# Patient Record
Sex: Female | Born: 1960
Health system: Southern US, Community
[De-identification: ages and names within clinical notes are randomized; demographics above are authoritative.]

## PROBLEM LIST (undated history)

## (undated) DIAGNOSIS — K219 Gastro-esophageal reflux disease without esophagitis: Secondary | ICD-10-CM

## (undated) DIAGNOSIS — R1032 Left lower quadrant pain: Secondary | ICD-10-CM

## (undated) DIAGNOSIS — F419 Anxiety disorder, unspecified: Secondary | ICD-10-CM

## (undated) DIAGNOSIS — R55 Syncope and collapse: Secondary | ICD-10-CM

## (undated) DIAGNOSIS — D649 Anemia, unspecified: Secondary | ICD-10-CM

## (undated) DIAGNOSIS — I10 Essential (primary) hypertension: Secondary | ICD-10-CM

## (undated) DIAGNOSIS — S92902A Unspecified fracture of left foot, initial encounter for closed fracture: Secondary | ICD-10-CM

## (undated) DIAGNOSIS — F32A Depression, unspecified: Secondary | ICD-10-CM

## (undated) DIAGNOSIS — M1990 Unspecified osteoarthritis, unspecified site: Secondary | ICD-10-CM

## (undated) DIAGNOSIS — R079 Chest pain, unspecified: Secondary | ICD-10-CM

## (undated) DIAGNOSIS — G4700 Insomnia, unspecified: Secondary | ICD-10-CM

## (undated) DIAGNOSIS — S82891A Other fracture of right lower leg, initial encounter for closed fracture: Secondary | ICD-10-CM

## (undated) DIAGNOSIS — Z8659 Personal history of other mental and behavioral disorders: Secondary | ICD-10-CM

## (undated) DIAGNOSIS — Z87898 Personal history of other specified conditions: Secondary | ICD-10-CM

## (undated) DIAGNOSIS — F410 Panic disorder [episodic paroxysmal anxiety] without agoraphobia: Secondary | ICD-10-CM

## (undated) DIAGNOSIS — E669 Obesity, unspecified: Secondary | ICD-10-CM

## (undated) DIAGNOSIS — R140 Abdominal distension (gaseous): Secondary | ICD-10-CM

## (undated) DIAGNOSIS — H109 Unspecified conjunctivitis: Secondary | ICD-10-CM

## (undated) DIAGNOSIS — Z973 Presence of spectacles and contact lenses: Secondary | ICD-10-CM

## (undated) DIAGNOSIS — G43909 Migraine, unspecified, not intractable, without status migrainosus: Secondary | ICD-10-CM

## (undated) DIAGNOSIS — G2581 Restless legs syndrome: Secondary | ICD-10-CM

## (undated) DIAGNOSIS — R569 Unspecified convulsions: Secondary | ICD-10-CM

## (undated) HISTORY — PX: ENDOMETRIAL ABLATION: SHX621

## (undated) HISTORY — DX: Syncope and collapse: R55

## (undated) HISTORY — DX: Left lower quadrant pain: R10.32

## (undated) HISTORY — DX: Chest pain, unspecified: R07.9

## (undated) HISTORY — DX: Unspecified conjunctivitis: H10.9

## (undated) HISTORY — DX: Unspecified convulsions: R56.9

## (undated) HISTORY — DX: Restless legs syndrome: G25.81

## (undated) HISTORY — DX: Abdominal distension (gaseous): R14.0

## (undated) HISTORY — PX: HYSTEROSCOPY W/ ENDOMETRIAL ABLATION: SUR665

## (undated) HISTORY — DX: Unspecified osteoarthritis, unspecified site: M19.90

## (undated) HISTORY — PX: POLYPECTOMY: SHX149

## (undated) HISTORY — DX: Insomnia, unspecified: G47.00

## (undated) HISTORY — DX: Anemia, unspecified: D64.9

## (undated) HISTORY — PX: FRACTURE SURGERY: SHX138

## (undated) HISTORY — PX: TUBAL LIGATION: SHX77

## (undated) HISTORY — DX: Panic disorder (episodic paroxysmal anxiety): F41.0

## (undated) HISTORY — DX: Anxiety disorder, unspecified: F41.9

## (undated) HISTORY — DX: Essential (primary) hypertension: I10

## (undated) HISTORY — DX: Obesity, unspecified: E66.9

## (undated) HISTORY — PX: CARDIAC CATHETERIZATION: SHX172

---
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
## 1999-12-08 ENCOUNTER — Emergency Department (HOSPITAL_COMMUNITY): Admission: EM | Admit: 1999-12-08 | Discharge: 1999-12-08 | Payer: Self-pay | Admitting: *Deleted

## 2007-02-03 ENCOUNTER — Emergency Department (HOSPITAL_COMMUNITY): Admission: EM | Admit: 2007-02-03 | Discharge: 2007-02-03 | Payer: Self-pay | Admitting: Emergency Medicine

## 2007-11-08 ENCOUNTER — Emergency Department (HOSPITAL_COMMUNITY): Admission: EM | Admit: 2007-11-08 | Discharge: 2007-11-08 | Payer: Self-pay | Admitting: Family Medicine

## 2010-01-27 ENCOUNTER — Ambulatory Visit: Payer: Self-pay | Admitting: Obstetrics & Gynecology

## 2010-01-27 ENCOUNTER — Other Ambulatory Visit: Admission: RE | Admit: 2010-01-27 | Discharge: 2010-01-27 | Payer: Self-pay | Admitting: Obstetrics and Gynecology

## 2010-01-28 ENCOUNTER — Encounter: Payer: Self-pay | Admitting: Obstetrics & Gynecology

## 2010-01-28 LAB — CONVERTED CEMR LAB
MCV: 77.8 fL — ABNORMAL LOW (ref 78.0–100.0)
Platelets: 363 10*3/uL (ref 150–400)
RDW: 15.8 % — ABNORMAL HIGH (ref 11.5–15.5)
TSH: 1.493 microintl units/mL (ref 0.350–4.500)
WBC: 10.3 10*3/uL (ref 4.0–10.5)

## 2010-02-03 ENCOUNTER — Ambulatory Visit (HOSPITAL_COMMUNITY): Admission: RE | Admit: 2010-02-03 | Discharge: 2010-02-03 | Payer: Self-pay | Admitting: Family Medicine

## 2010-02-18 ENCOUNTER — Ambulatory Visit: Payer: Self-pay | Admitting: Obstetrics and Gynecology

## 2010-02-24 ENCOUNTER — Ambulatory Visit: Payer: Self-pay | Admitting: Family Medicine

## 2010-02-24 ENCOUNTER — Encounter: Payer: Self-pay | Admitting: Family Medicine

## 2010-02-24 DIAGNOSIS — F410 Panic disorder [episodic paroxysmal anxiety] without agoraphobia: Secondary | ICD-10-CM | POA: Insufficient documentation

## 2010-02-24 DIAGNOSIS — N949 Unspecified condition associated with female genital organs and menstrual cycle: Secondary | ICD-10-CM | POA: Insufficient documentation

## 2010-02-24 DIAGNOSIS — Z8659 Personal history of other mental and behavioral disorders: Secondary | ICD-10-CM | POA: Insufficient documentation

## 2010-02-24 DIAGNOSIS — M1710 Unilateral primary osteoarthritis, unspecified knee: Secondary | ICD-10-CM | POA: Insufficient documentation

## 2010-02-24 DIAGNOSIS — N925 Other specified irregular menstruation: Secondary | ICD-10-CM | POA: Insufficient documentation

## 2010-02-24 DIAGNOSIS — N938 Other specified abnormal uterine and vaginal bleeding: Secondary | ICD-10-CM | POA: Insufficient documentation

## 2010-03-09 ENCOUNTER — Ambulatory Visit (HOSPITAL_COMMUNITY): Admission: RE | Admit: 2010-03-09 | Discharge: 2010-03-09 | Payer: Self-pay | Admitting: Family Medicine

## 2010-03-23 ENCOUNTER — Ambulatory Visit: Payer: Self-pay | Admitting: Obstetrics and Gynecology

## 2010-03-23 ENCOUNTER — Ambulatory Visit (HOSPITAL_COMMUNITY): Admission: RE | Admit: 2010-03-23 | Discharge: 2010-03-23 | Payer: Self-pay | Admitting: Obstetrics and Gynecology

## 2010-07-08 ENCOUNTER — Emergency Department (HOSPITAL_COMMUNITY): Admission: EM | Admit: 2010-07-08 | Discharge: 2009-08-13 | Payer: Self-pay | Admitting: Emergency Medicine

## 2010-08-31 NOTE — Assessment & Plan Note (Signed)
Summary: New pt visit   Vital Signs:  Patient profile:   50 year old female Height:      65 inches Weight:      292 pounds BMI:     48.77 Temp:     98.2 degrees F oral Pulse rate:   92 / minute BP sitting:   146 / 84  (left arm) Cuff size:   large  Vitals Entered By: Tessie Fass CMA (February 24, 2010 2:35 PM) CC: NEW PT Is Patient Diabetic? No Pain Assessment Patient in pain? no        Primary Provider:  Demetria Pore  CC:  NEW PT.  History of Present Illness: Pt presenting for new pt evaluation. No complaints or health concerns other than wanting help with loosing weight. Currently has pain in her knees and gets tired easily, especially when walking up stairs, but attributes this to her weight. She has lost a large amount of weight in the past while going to a bariatric clinic but has gained it back. She states that she is an emotional eater and would like to lose 150lbs. She is reasonable with this goal and understands that it will be a slow process and does not indicated that she wants any "quick fixes" or surgery. She is very interested in meeting with a nutrionist.  She is currently being seen at [facility] GYN clinic for menometorrhagia. She will recieve NovaSure endometrial ablation treatment. Nml pap and endometrial biopsy in the past month. CBC showed anemia. TSH was WNL.   Current Problems (verified): 1)  Depression  (ICD-311) 2)  Anxiety  (ICD-300.00) 3)  Family History Diabetes 1st Degree Relative  (ICD-V18.0) 4)  Family History Depression  (ICD-V17.0) 5)  Obesity, Unspecified  (ICD-278.00) 6)  Menorrhagia, Perimenopausal  (ICD-626.8) 7)  Hx of Panic Attack  (ICD-300.01) 8)  Knee Pain, Bilateral  (ICD-719.46)  Current Medications (verified): 1)  None  Allergies (verified): No Known Drug Allergies  Past History:  Family History: Last updated: 02/24/2010 Father: MI s/p CABG?, committed suicide at age 20 Mother: Alive, 58 y/o. Obesity, HTN, HLD, DM, MI in  her late 61s. Siblings: 4 siblings, ages 74, 39, 42, 53. All healthy, no significant medical problems.  Social History: Last updated: 02/24/2010 Marital Status: Separated x5 years. Husband lives in [place], [place]. Alcoholic. Bad marriage. Moved back to [place] 5 years ago. Currently lives in house that she owns with her children. Children: 4 (31 F, 24 M, 18 M, 17 M). All healthy. Occupation: [employer], started April 2011, works 3rd shift. College education.  Former Smoker, quit 2000 Alcohol use-yes, occ Regular exercise-no  Risk Factors: Exercise: no (02/24/2010)  Risk Factors: Smoking Status: quit (02/24/2010)  Past Medical History: Anxiety Depression Obesity  Past Surgical History: Tubal ligation- 55  Family History: Father: MI s/p CABG?, committed suicide at age 47 Mother: Alive, 41 y/o. Obesity, HTN, HLD, DM, MI in her late 28s. Siblings: 4 siblings, ages 77, 29, 74, 35. All healthy, no significant medical problems.  Social History: Marital Status: Separated x5 years. Husband lives in [place], [place]. Alcoholic. Bad marriage. Moved back to [place] 5 years ago. Currently lives in house that she owns with her children. Children: 4 (31 F, 24 M, 18 M, 17 M). All healthy. Occupation: [employer], started April 2011, works 3rd shift. College education.  Former Smoker, quit 2000 Alcohol use-yes, occ Regular exercise-no Smoking Status:  quit Does Patient Exercise:  no Sex Orientation:  Heterosexual Residence:  Renting Transportation:  Own Print production planner Use:  yes Alcohol:  Less than 3 drinks per week Education:  College Ethnicity:  White Drug Use:  never  Physical Exam  General:  Well-developed,well-nourished,in no acute distress; alert,appropriate and cooperative throughout examination Eyes:  No corneal or conjunctival inflammation noted. EOMI. Perrla. Vision grossly normal. Ears:  External ear exam shows no significant lesions or deformities.  Otoscopic  examination reveals clear canals, tympanic membranes are intact bilaterally without bulging, retraction, inflammation or discharge. Hearing is grossly normal bilaterally. Mouth:  Oral mucosa and oropharynx without lesions or exudates.  Teeth in good repair. Neck:  No deformities, masses, or tenderness noted. Lungs:  Normal respiratory effort, chest expands symmetrically. Lungs are clear to auscultation, no crackles or wheezes. Heart:  Normal rate and regular rhythm. S1 and S2 normal without gallop, murmur, click, rub or other extra sounds. Abdomen:  Bowel sounds positive,abdomen soft and non-tender, obese, without masses, organomegaly or hernias noted. Pulses:  R and L carotid,radial,femoral,dorsalis pedis and posterior tibial pulses are full and equal bilaterally Extremities:  No clubbing, cyanosis, edema, or deformity noted with normal full range of motion of all joints.   Neurologic:  alert & oriented X3.     Impression & Recommendations:  Problem # 1:  HEALTH MAINTENANCE EXAM (ICD-V70.0) Assessment New New pt visit to establish care. Will have pt schedule mammogram and colonoscopy. Have pt f/u in 3 months for BP recheck since ?HTN.  Problem # 2:  OBESITY, UNSPECIFIED (ICD-278.00) Assessment: New Will get fasting lipids. Pt to schedule appt with nutritionist for counseling. Future Orders: [facility] ([id number]) ... 02/25/2011  Problem # 3:  MENORRHAGIA, PERIMENOPAUSAL (ICD-626.8) Assessment: New Being followed by GYN clinic at [facility]. Will be called by their clinic with appt for NovaSure endometrial ablation. Endometrial bx and pap smear results were normal.   Patient Instructions: 1)   It was great meeting you today! 2)  Please schedule an appointment with the nutritionist in September. 3)  Until you can see the nutritionist, things you can do to help you start losing weight include eating twice as many vegetables as starches. Eating lean protein such as chicken and fish. Eating  at least 3 meals per day with 1-2 healthy snacks. Drinking water in place of soda or juice. Please do the 3 day calorie count at some point before your meeting with the nutritionist. 4)  Please call and let me know once you are scheduled for you NovaSure. 5)  I would like for you to come back to have some fasting labs drawn to check your cholesterol. 6)  I am giving you the phone number for the [facility]. Please call and make an appointment to have a mammogram done. 7)  I am also making a referral for you to have a colonoscopy done. This is just a routine screen that we do for everyone over the age of 43. Please call and make an appointment for this. 8)  I would like to see you back in 3 months to recheck your blood pressure and to see how the weight loss is going.

## 2010-09-22 ENCOUNTER — Emergency Department (HOSPITAL_COMMUNITY)
Admission: EM | Admit: 2010-09-22 | Discharge: 2010-09-22 | Disposition: A | Discharge status: Home / Self Care | Payer: No Typology Code available for payment source | Attending: Emergency Medicine | Admitting: Emergency Medicine

## 2010-09-22 ENCOUNTER — Emergency Department (HOSPITAL_COMMUNITY): Payer: Self-pay

## 2010-09-22 ENCOUNTER — Telehealth: Payer: Self-pay | Admitting: Family Medicine

## 2010-09-22 DIAGNOSIS — R209 Unspecified disturbances of skin sensation: Secondary | ICD-10-CM | POA: Insufficient documentation

## 2010-09-22 DIAGNOSIS — R569 Unspecified convulsions: Secondary | ICD-10-CM | POA: Insufficient documentation

## 2010-09-22 DIAGNOSIS — J329 Chronic sinusitis, unspecified: Secondary | ICD-10-CM | POA: Insufficient documentation

## 2010-09-22 DIAGNOSIS — R9431 Abnormal electrocardiogram [ECG] [EKG]: Secondary | ICD-10-CM | POA: Insufficient documentation

## 2010-09-22 DIAGNOSIS — R55 Syncope and collapse: Secondary | ICD-10-CM | POA: Insufficient documentation

## 2010-09-22 LAB — CBC
HCT: 34.4 % — ABNORMAL LOW (ref 36.0–46.0)
MCH: 22.9 pg — ABNORMAL LOW (ref 26.0–34.0)
MCHC: 30.8 g/dL (ref 30.0–36.0)
MCV: 74.3 fL — ABNORMAL LOW (ref 78.0–100.0)
Platelets: 353 10*3/uL (ref 150–400)
RDW: 19 % — ABNORMAL HIGH (ref 11.5–15.5)
WBC: 7.9 10*3/uL (ref 4.0–10.5)

## 2010-09-22 LAB — COMPREHENSIVE METABOLIC PANEL
ALT: 26 U/L (ref 0–35)
BUN: 12 mg/dL (ref 6–23)
CO2: 26 mEq/L (ref 19–32)
Calcium: 9.2 mg/dL (ref 8.4–10.5)
Creatinine, Ser: 0.59 mg/dL (ref 0.4–1.2)
GFR calc non Af Amer: >60 mL/min (ref >60)
Glucose, Bld: 92 mg/dL (ref 70–99)
Total Protein: 6.7 g/dL (ref 6.0–8.3)

## 2010-09-22 LAB — URINE MICROSCOPIC-ADD ON

## 2010-09-22 LAB — DIFFERENTIAL
Basophils Relative: 0 % (ref 0–1)
Eosinophils Absolute: 0.2 10*3/uL (ref 0.0–0.7)
Eosinophils Relative: 3 % (ref 0–5)
Lymphs Abs: 2.5 10*3/uL (ref 0.7–4.0)
Monocytes Absolute: 0.5 10*3/uL (ref 0.1–1.0)
Neutro Abs: 4.7 10*3/uL (ref 1.7–7.7)
Neutrophils Relative %: 60 % (ref 43–77)

## 2010-09-22 LAB — URINALYSIS, ROUTINE W REFLEX MICROSCOPIC
Bilirubin Urine: NEGATIVE
Ketones, ur: NEGATIVE mg/dL
Protein, ur: NEGATIVE mg/dL
Urine Glucose, Fasting: NEGATIVE mg/dL
Urobilinogen, UA: 0.2 mg/dL (ref 0.0–1.0)

## 2010-09-22 LAB — ETHANOL: Alcohol, Ethyl (B): <5 mg/dL (ref 0–10)

## 2010-09-22 LAB — RAPID URINE DRUG SCREEN, HOSP PERFORMED
Amphetamines: NOT DETECTED
Barbiturates: NOT DETECTED
Benzodiazepines: NOT DETECTED
Cocaine: NOT DETECTED
Opiates: NOT DETECTED
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
## 2010-09-24 ENCOUNTER — Encounter: Payer: Self-pay | Admitting: Family Medicine

## 2010-09-24 ENCOUNTER — Ambulatory Visit (INDEPENDENT_AMBULATORY_CARE_PROVIDER_SITE_OTHER): Payer: Self-pay | Admitting: Family Medicine

## 2010-09-24 VITALS — BP 180/79 | HR 89 | Wt 297.5 lb

## 2010-09-24 DIAGNOSIS — R569 Unspecified convulsions: Secondary | ICD-10-CM

## 2010-09-24 DIAGNOSIS — I10 Essential (primary) hypertension: Secondary | ICD-10-CM | POA: Insufficient documentation

## 2010-09-24 DIAGNOSIS — E669 Obesity, unspecified: Secondary | ICD-10-CM

## 2010-09-24 MED ORDER — HYDROCHLOROTHIAZIDE 25 MG PO TABS: 25.0000 mg | ORAL_TABLET | Freq: Every day | ORAL | Status: DC

## 2010-09-24 NOTE — Patient Instructions (Signed)
It was nice to meet you today!  Follow up in 2-4 weeks.

## 2010-09-27 ENCOUNTER — Encounter: Payer: Self-pay | Admitting: Family Medicine

## 2010-09-27 DIAGNOSIS — R55 Syncope and collapse: Secondary | ICD-10-CM | POA: Insufficient documentation

## 2010-09-27 HISTORY — DX: Syncope and collapse: R55

## 2010-09-27 NOTE — Assessment & Plan Note (Signed)
Hx of syncope, with witness seizure-like activity lasting 20 minutes, and possible postictal state on 09/22/10. Evaluated in ED - reviewed note. Normal exam, CT head, CE, CMP, UDS, ETOH, d-dimer, UA. CBC with Hgb 10.6 (stable). EKG with prolonged QT. No prolactin done at that time. Patient does not have insurance, so no f/u with neurology. Because episode was possibly a seizure, guidelines state that patient should not drive for 3 months or until seen by neurology.

## 2010-09-27 NOTE — Assessment & Plan Note (Signed)
Patient concerned about weight gain. She has never met with nutritionist and is interested in doing so. Discussed exercise. Patient to make appointment with nutritionist. She will make am appointment for f/u so that we can obtain FLP at that time as well.

## 2010-09-27 NOTE — Assessment & Plan Note (Signed)
Patient endorses Hx of HTN. Will start low-dose HCTZ, f/u in 2 weeks.

## 2010-09-27 NOTE — Progress Notes (Signed)
  Subjective:    Patient ID: Sheryl Cooper, female    DOB: 07-21-1961, 50 y.o.   MRN: [id number]  HPI  1. Follow-Up ED Visit: Visit reviewed. Episode of syncope with possible 20 minute seizure-like activity, followed by post-ictal state. This information is all based on Hx provided by patient's son. Patient transferred to ER via EMS - negative work-up. Patient denies Hx of seizures. No further episodes. She denies HA, dizziness, URI s/s, N/V/D, CP, SOB, increased LE edema.   2. HTN: Patient denies ever needing medication for this issue, but endorses prior high readings.   3. Obesity: Patient continues to gain weight. She states that she feels unable to manage this issue on her own. She does not exercise regularly. She has never met with a nutritionist before.  Review of Systems See HPI.    Objective:   Physical Exam  Constitutional: Vital signs are normal. She appears well-developed and well-nourished. No distress.  Eyes: EOM are normal. Pupils are equal, round, and reactive to light.  Cardiovascular: Normal rate, regular rhythm, S1 normal, S2 normal and normal pulses.   Murmur heard.  Systolic murmur is present with a grade of 2/6       1-2+ LE edema bilaterally.  Pulmonary/Chest: Effort normal and breath sounds normal.  Neurological: She is alert.          Assessment & Plan:

## 2010-09-28 ENCOUNTER — Encounter: Payer: Self-pay | Admitting: Family Medicine

## 2010-09-28 ENCOUNTER — Telehealth: Payer: Self-pay | Admitting: Family Medicine

## 2010-09-28 NOTE — Telephone Encounter (Signed)
Attempted to call patient re: driving restrictions. No answer. Letter written.

## 2010-10-12 ENCOUNTER — Ambulatory Visit (INDEPENDENT_AMBULATORY_CARE_PROVIDER_SITE_OTHER): Payer: Self-pay | Admitting: Family Medicine

## 2010-10-12 ENCOUNTER — Encounter: Payer: Self-pay | Admitting: Family Medicine

## 2010-10-12 VITALS — BP 147/78 | HR 73 | Temp 98.5°F | Ht 65.0 in | Wt 292.0 lb

## 2010-10-12 DIAGNOSIS — N949 Unspecified condition associated with female genital organs and menstrual cycle: Secondary | ICD-10-CM

## 2010-10-12 DIAGNOSIS — I10 Essential (primary) hypertension: Secondary | ICD-10-CM

## 2010-10-12 DIAGNOSIS — R569 Unspecified convulsions: Secondary | ICD-10-CM

## 2010-10-12 DIAGNOSIS — E669 Obesity, unspecified: Secondary | ICD-10-CM

## 2010-10-12 DIAGNOSIS — N938 Other specified abnormal uterine and vaginal bleeding: Secondary | ICD-10-CM

## 2010-10-12 LAB — COMPREHENSIVE METABOLIC PANEL
ALT: 26 U/L (ref 0–35)
AST: 32 U/L (ref 0–37)
CO2: 26 mEq/L (ref 19–32)
Calcium: 9.4 mg/dL (ref 8.4–10.5)
Chloride: 101 mEq/L (ref 96–112)
Creat: 0.59 mg/dL (ref 0.40–1.20)
Sodium: 139 mEq/L (ref 135–145)
Total Protein: 7.3 g/dL (ref 6.0–8.3)

## 2010-10-12 LAB — CONVERTED CEMR LAB
ALT: 26 units/L (ref 0–35)
AST: 32 units/L (ref 0–37)
Albumin: 4 g/dL (ref 3.5–5.2)
BUN: 12 mg/dL (ref 6–23)
CO2: 26 meq/L (ref 19–32)
Calcium: 9.4 mg/dL (ref 8.4–10.5)
Chloride: 101 meq/L (ref 96–112)
HDL: 45 mg/dL (ref >39)
Hemoglobin: 11.1 g/dL — ABNORMAL LOW (ref 12.0–15.0)
Potassium: 4.6 meq/L (ref 3.5–5.3)
RDW: 19.6 % — ABNORMAL HIGH (ref 11.5–15.5)
TSH: 2.052 microintl units/mL (ref 0.350–4.500)
WBC: 7.2 10*3/uL (ref 4.0–10.5)

## 2010-10-12 LAB — CBC
HCT: 36.9 % (ref 36.0–46.0)
Platelets: 370 10*3/uL (ref 150–400)
RBC: 4.79 MIL/uL (ref 3.87–5.11)
RDW: 19.6 % — ABNORMAL HIGH (ref 11.5–15.5)
WBC: 7.2 10*3/uL (ref 4.0–10.5)

## 2010-10-12 LAB — LIPID PANEL
HDL: 45 mg/dL (ref >39)
LDL Cholesterol: 57 mg/dL (ref 0–99)
Total CHOL/HDL Ratio: 3 Ratio

## 2010-10-12 LAB — TSH: TSH: 2.052 u[IU]/mL (ref 0.350–4.500)

## 2010-10-12 MED ORDER — LISINOPRIL-HYDROCHLOROTHIAZIDE 20-25 MG PO TABS: 1.0000 | ORAL_TABLET | Freq: Every day | ORAL | Status: DC

## 2010-10-12 NOTE — Assessment & Plan Note (Signed)
Adding Lisinopril. Patient to come back for nurse visit to check BP in a few weeks. CMP today.

## 2010-10-12 NOTE — Progress Notes (Signed)
  Subjective:    Patient ID: Sheryl Cooper, female    DOB: [birth year], 50 y.o.   MRN: [id number]  HPI  1. HTN: Rx HCTZ x 2-3 weeks. No side-effects. Endorses decreased LE edema. SBP usually 140s-150s. She has started the DASH diet. She is now exercising regularly. Denies HA, dizziness, CP, SOB, N/V/D, rash.  2. S/P Seizure: x 1. See last OV for more details. No more episodes. Patient received notification - no driving x 3 months. She is okay with this. She declines neurology consult at this time.   Review of Systems See HPI.    Objective:   Physical Exam  Constitutional: Vital signs are normal. She appears well-developed and well-nourished. No distress.  Neck: No thyromegaly present.  Cardiovascular: Normal rate, regular rhythm and normal heart sounds.   Pulmonary/Chest: Effort normal and breath sounds normal.  Musculoskeletal:       Right lower leg: She exhibits edema.       Left lower leg: She exhibits edema.       Edema improved bilateral LE.  Neurological: She is alert. She has normal strength. A cranial nerve deficit and sensory deficit is present. She displays a negative Romberg sign.          Assessment & Plan:

## 2010-10-12 NOTE — Assessment & Plan Note (Signed)
Hx of anemia. Will recheck with other labs today.

## 2010-10-12 NOTE — Assessment & Plan Note (Signed)
Checking CMP, FLP, TSH today.

## 2010-10-12 NOTE — Patient Instructions (Signed)
It was nice to see you today!  Continue with the DASH diet and exercise.  I am adding Lisinopril for your BP. Come back for a nurse visit in 4 weeks. You may check your BP at home as well.  I am getting labs today.  Remember, no driving for 3 months.  Restart your multivitamins, including Iron 325 extended release once daily.

## 2010-10-12 NOTE — Assessment & Plan Note (Signed)
No more episodes. Patient declines Neurology consult.

## 2010-10-13 ENCOUNTER — Encounter: Payer: Self-pay | Admitting: Family Medicine

## 2010-10-13 ENCOUNTER — Ambulatory Visit: Payer: Self-pay | Admitting: Family Medicine

## 2010-10-15 LAB — CBC
Hemoglobin: 9.3 g/dL — ABNORMAL LOW (ref 12.0–15.0)
Platelets: 281 10*3/uL (ref 150–400)
RBC: 4.22 MIL/uL (ref 3.87–5.11)
WBC: 7.1 10*3/uL (ref 4.0–10.5)

## 2010-10-17 LAB — POCT I-STAT, CHEM 8
BUN: 8 mg/dL (ref 6–23)
Calcium, Ion: 1.03 mmol/L — ABNORMAL LOW (ref 1.12–1.32)
Chloride: 106 mEq/L (ref 96–112)
Creatinine, Ser: 0.5 mg/dL (ref 0.4–1.2)
Glucose, Bld: 131 mg/dL — ABNORMAL HIGH (ref 70–99)

## 2010-10-17 LAB — POCT CARDIAC MARKERS: Troponin i, poc: <0.05 ng/mL (ref 0.00–0.09)

## 2010-10-17 LAB — CBC
HCT: 34.5 % — ABNORMAL LOW (ref 36.0–46.0)
Hemoglobin: 11 g/dL — ABNORMAL LOW (ref 12.0–15.0)
MCV: 76 fL — ABNORMAL LOW (ref 78.0–100.0)
RBC: 4.54 MIL/uL (ref 3.87–5.11)
WBC: 7.8 10*3/uL (ref 4.0–10.5)

## 2010-10-17 LAB — DIFFERENTIAL
Eosinophils Relative: 2 % (ref 0–5)
Lymphocytes Relative: 29 % (ref 12–46)
Lymphs Abs: 2.3 10*3/uL (ref 0.7–4.0)
Monocytes Absolute: 0.6 10*3/uL (ref 0.1–1.0)
Monocytes Relative: 8 % (ref 3–12)

## 2010-10-17 LAB — POCT PREGNANCY, URINE: Preg Test, Ur: NEGATIVE

## 2010-10-17 LAB — D-DIMER, QUANTITATIVE: D-Dimer, Quant: 0.47 ug/mL-FEU (ref 0.00–0.48)

## 2011-11-07 ENCOUNTER — Other Ambulatory Visit: Payer: Self-pay | Admitting: Family Medicine

## 2012-02-16 ENCOUNTER — Ambulatory Visit (INDEPENDENT_AMBULATORY_CARE_PROVIDER_SITE_OTHER): Payer: Self-pay | Admitting: Family Medicine

## 2012-02-16 ENCOUNTER — Encounter: Payer: Self-pay | Admitting: Family Medicine

## 2012-02-16 VITALS — BP 122/76 | HR 73 | Ht 65.0 in | Wt 268.8 lb

## 2012-02-16 DIAGNOSIS — R140 Abdominal distension (gaseous): Secondary | ICD-10-CM | POA: Insufficient documentation

## 2012-02-16 DIAGNOSIS — R141 Gas pain: Secondary | ICD-10-CM

## 2012-02-16 DIAGNOSIS — R143 Flatulence: Secondary | ICD-10-CM

## 2012-02-16 LAB — COMPREHENSIVE METABOLIC PANEL
ALT: 20 U/L (ref 0–35)
CO2: 25 mEq/L (ref 19–32)
Calcium: 10 mg/dL (ref 8.4–10.5)
Chloride: 101 mEq/L (ref 96–112)
Sodium: 136 mEq/L (ref 135–145)
Total Protein: 7.1 g/dL (ref 6.0–8.3)

## 2012-02-16 LAB — CBC
Platelets: 371 10*3/uL (ref 150–400)
RDW: 16 % — ABNORMAL HIGH (ref 11.5–15.5)
WBC: 6.5 10*3/uL (ref 4.0–10.5)

## 2012-02-16 NOTE — Assessment & Plan Note (Addendum)
Will check CMET, CBC, lipase, although all will likely be normal.  Pt would prefer NOT to have abd U/S done due to financial constraints. Red flags for return discussed. F/u if not improving.

## 2012-02-16 NOTE — Patient Instructions (Addendum)
It was good to see you today.  I'm sorry you're having this problem.  We are going to get some blood work to make sure this isn't anything bad.  Most likely, all of these tests will be normal. If it continues, we should get an ultrasound of your stomach.  Pay attention to your diet and see if there is any particular food that seems to make this bloating feeling worse.  I think that your shoulder/back pain is related to your stomach fullness.  Come back if this starts getting worse, you feel like your stomach is actually getting swollen, you have fevers/chills, vomiting, or blood in your stool.

## 2012-02-16 NOTE — Progress Notes (Signed)
S: Pt comes in today for SDA for abdominal pain.  Epigastric bloating/fullness, feels like she needs to burp, which she has been doing, but only helps the pain/discomfort a little.  Has been happening x3 days, has been about the same, although yesterday was a little worse.  Not really a pain, more feels like she needs to belch.  Has been taking a lot of Tums the past few days- helping a little, also tried pepcid which might have helped.  Eating doesn't change the full feeling.   Also having some back pain between her shoulder blades- has tried stretching, Aleve, motrin and doesn't help the pain at all.  Back pain is more burning in nature, not sharp, also for 3 days.  Stomach fullness gets worse, it does not change her back pain.  + mild nausea, 1-2 episodes of lightheadedness at the same time. No vomiting or diarrhea, no melena or blood in stool, last BM yesterday.  No fevers/chills.  No relation to exercise, no CP or different/unusual SOB.  No changes in activity (no bending, lifting, moving heavy objects) but did recently start walking for exercise this week.    On Atkin's diet for about 6 weeks, so not eating carbs or sodas.  Eats lots of fruits and veggies, no recent changes in diet in the past 6 weeks.  Under a lot of stress right now (family members have lost their jobs and she can't afford to pay for everything for everyone).   Feels a little better today after stopping iced tea drinks, which she first started ~1 week ago.    ROS: Per HPI  History  Smoking status  . Former Smoker  Smokeless tobacco  . Former Neurosurgeon  . Quit date: 09/24/1993    O:  Filed Vitals:   02/16/12 0917  BP: 122/76  Pulse: 73    Gen: NAD CV: RRR, no murmur Pulm: CTA bilat, no wheezes or crackles Abd: soft, NT Ext: Warm, no chronic skin changes, no edema   A/P: 51 y.o. female p/w abdominal bloating -See problem list -f/u in 1 week

## 2012-02-26 ENCOUNTER — Encounter (HOSPITAL_BASED_OUTPATIENT_CLINIC_OR_DEPARTMENT_OTHER): Payer: Self-pay | Admitting: Nurse Practitioner

## 2012-02-26 ENCOUNTER — Emergency Department (HOSPITAL_BASED_OUTPATIENT_CLINIC_OR_DEPARTMENT_OTHER)
Admission: EM | Admit: 2012-02-26 | Discharge: 2012-02-26 | Disposition: A | Discharge status: Home / Self Care | Payer: Self-pay | Attending: Emergency Medicine | Admitting: Emergency Medicine

## 2012-02-26 DIAGNOSIS — K089 Disorder of teeth and supporting structures, unspecified: Secondary | ICD-10-CM | POA: Insufficient documentation

## 2012-02-26 DIAGNOSIS — K047 Periapical abscess without sinus: Secondary | ICD-10-CM

## 2012-02-26 DIAGNOSIS — K029 Dental caries, unspecified: Secondary | ICD-10-CM | POA: Insufficient documentation

## 2012-02-26 MED ORDER — PENICILLIN V POTASSIUM 500 MG PO TABS: 500.0000 mg | ORAL_TABLET | Freq: Four times a day (QID) | ORAL | Status: DC

## 2012-02-26 MED ORDER — TRAMADOL HCL 50 MG PO TABS: 50.0000 mg | ORAL_TABLET | Freq: Four times a day (QID) | ORAL | Status: DC | PRN

## 2012-02-26 NOTE — ED Notes (Signed)
Pt states taking daughters rx for  125mg  ketoprofen for pain from abscess  at 1500 and 2200 and c/o facial and lip swelling w/in 30 min of dose.

## 2012-02-26 NOTE — ED Provider Notes (Signed)
History   This chart was scribed for Eberardo Demello Smitty Cords, MD by Shari Heritage. The patient was seen in room MH07/MH07. Patient's care was started at 0045.     CSN: [id number]  Arrival date & time 02/26/12  0045   First MD Initiated Contact with Patient 02/26/12 [phone]      Chief Complaint  Patient presents with  . Allergic Reaction    (Consider location/radiation/quality/duration/timing/severity/associated sxs/prior treatment) Patient is a 51 y.o. female presenting with tooth pain. The history is provided by the patient. No language interpreter was used.  Dental PainPrimary symptoms do not include oral bleeding, fever, shortness of breath, sore throat, angioedema or cough. The symptoms began yesterday. The symptoms are worsening. The symptoms are new. The symptoms occur constantly.  Additional symptoms include: dental sensitivity to temperature and facial swelling. Additional symptoms do not include: purulent gums, trismus, trouble swallowing and drooling. Medical issues do not include: smoking.   BERT GIVANS is a 51 y.o. female who presents to the Emergency Department complaining of moderate to severe, constant right upper dental pain in teeth 1-4 with associated right-sided cheek and lip swelling. Patient states that she took her daughter's Ketoprofen 125mg  prescription at 3:00pm and 10:00pm today, thought facial swelling may have been related. Patient does not have a dentist. She has a medical h/o cancer. Patient is a former smoker (quit date: 09/25/1993).   Past Medical History  Diagnosis Date  . Cancer     History reviewed. No pertinent past surgical history.  No family history on file.  History  Substance Use Topics  . Smoking status: Former Games developer  . Smokeless tobacco: Former Neurosurgeon    Quit date: 09/24/1993  . Alcohol Use: Not on file    OB History    Grav Para Term Preterm Abortions TAB SAB Ect Mult Living                  Review of Systems  Constitutional:  Negative for fever.  HENT: Positive for facial swelling. Negative for sore throat, drooling and trouble swallowing.   Respiratory: Negative for cough and shortness of breath.   All other systems reviewed and are negative.   A complete 10 system review of systems was obtained and all systems are negative except as noted in the HPI and PMH.   Allergies  Review of patient's allergies indicates no known allergies.  Home Medications   Current Outpatient Rx  Name Route Sig Dispense Refill  . LISINOPRIL-HYDROCHLOROTHIAZIDE 20-25 MG PO TABS  TAKE 1 TABLET BY MOUTH DAILY. 30 tablet 0    Needs appointment prior to next refill    BP 143/85  Pulse 76  Temp 97.5 F (36.4 C) (Oral)  Resp 21  SpO2 100%  LMP 02/12/2012  Physical Exam  Nursing note and vitals reviewed. Constitutional: She is oriented to person, place, and time. She appears well-developed and well-nourished.  HENT:  Mouth/Throat: Oropharynx is clear and moist. Dental caries present. No oropharyngeal exudate.       Mild cheek and lip swelling on the right side. Teeth 1 and 2 have cavities. Teeth 3 and 4 are necrotic and infected and broken below the gum line  Eyes: Conjunctivae and EOM are normal. Pupils are equal, round, and reactive to light.  Neck: Normal range of motion. Neck supple. No tracheal deviation present. No thyromegaly present.  Cardiovascular: Normal rate and regular rhythm.   Pulmonary/Chest: Effort normal and breath sounds normal. No stridor.  Abdominal: Soft. Bowel sounds are  normal.  Musculoskeletal: Normal range of motion.  Neurological: She is alert and oriented to person, place, and time.  Skin: Skin is warm and dry.  Psychiatric: She has a normal mood and affect.    ED Course  Procedures (including critical care time) DIAGNOSTIC STUDIES: Oxygen Saturation is 100% on room air, normal by my interpretation.    COORDINATION OF CARE: 12:58am- Patient informed of current plan for treatment and  evaluation and agrees with plan at this time.     Labs Reviewed - No data to display No results found.   No diagnosis found.    MDM  Return for worsening swelling, inability to swallow and inability to open mouth, fevers or any concerns. Follow up with dentistry for treatment of dental caries.  Patient verbalizes understanding and agrees to follow up      I personally performed the services described in this documentation, which was scribed in my presence. The recorded information has been reviewed and considered.    Jasmine Awe, MD 02/26/12 [phone]

## 2012-02-26 NOTE — ED Notes (Signed)
Pt. swelling is unilateral on Right side and has dental pain on rt. Side. Pt. Is in no distress, and had no trouble talking and handling secretions.

## 2012-04-19 ENCOUNTER — Other Ambulatory Visit: Payer: Self-pay | Admitting: Family Medicine

## 2012-07-05 ENCOUNTER — Telehealth: Payer: Self-pay | Admitting: Family Medicine

## 2012-07-05 NOTE — Telephone Encounter (Signed)
Received call from patient stating that she has a tooth infection with some gum swelling.  Wondering if we could call in an antibiotic for her.  I advised without evaluating her that I could not prescribe and antibiotic.  I advised that she call for work in appointment tomorrow morning to be evaluated then make follow up appointment with me as I am her new PCP.

## 2012-07-06 ENCOUNTER — Ambulatory Visit (INDEPENDENT_AMBULATORY_CARE_PROVIDER_SITE_OTHER): Payer: Self-pay | Admitting: Family Medicine

## 2012-07-06 ENCOUNTER — Encounter: Payer: Self-pay | Admitting: Family Medicine

## 2012-07-06 VITALS — BP 156/81 | HR 66 | Temp 98.1°F | Ht 65.0 in | Wt 244.2 lb

## 2012-07-06 DIAGNOSIS — K047 Periapical abscess without sinus: Secondary | ICD-10-CM | POA: Insufficient documentation

## 2012-07-06 DIAGNOSIS — K044 Acute apical periodontitis of pulpal origin: Secondary | ICD-10-CM

## 2012-07-06 MED ORDER — TRAMADOL HCL 50 MG PO TABS: 50.0000 mg | ORAL_TABLET | Freq: Four times a day (QID) | ORAL | Status: AC | PRN

## 2012-07-06 MED ORDER — PENICILLIN V POTASSIUM 500 MG PO TABS: 500.0000 mg | ORAL_TABLET | Freq: Three times a day (TID) | ORAL | Status: AC

## 2012-07-06 NOTE — Assessment & Plan Note (Signed)
Needs to have tooth removed- will be able to have this done in January.  Given 1 month supply of PCN for obvious dental infection (?pulpitis).  Also given tramadol for pain control. F/u if worsening.

## 2012-07-06 NOTE — Progress Notes (Signed)
S: Pt comes in today for SDA for tooth infection.  Patient reports that she has had this before over the summer.  Pain started 3 days ago, swelling started yesterday.  Feels like it is a little worse today.  Feels like cheek is a little warm.  Is top back tooth that hurts.  Knows that she needs to go to dentist; starts having dental insurance in January.  Knows that she needs oral surgery due to 2 or 3 dead teeth.  No fevers/chills.  No drainage into mouth.  Pain is throbbing.  Able to drink and swallow ok.  Has not tried to eat solids because she thinks it will hurt.  Is taking Aleve, which helps a little with the pain if she takes more than she is supposed to.    Over the summer, had good pain control with tramadol; does not want percocet- doesn't like how it makes her feel.    ROS: Per HPI  History  Smoking status  . Former Smoker  Smokeless tobacco  . Former Neurosurgeon  . Quit date: 09/24/1993    O:  Filed Vitals:   07/06/12 1047  BP: 156/81  Pulse: 66  Temp: 98.1 F (36.7 C)    Gen: NAD HEENT: no TTP, small amount of swelling on R cheek without warmth, + upper gum swelling, partial top denture- infected tooth is just lateral on the right, no drainage from tooth but obvious irritation and inflammation, no cervical LAD, no trismus, able to open jaw fully     A/P: 50 y.o. female p/w tooth infection -See problem list -f/u in PRN with PCP

## 2012-07-06 NOTE — Patient Instructions (Signed)
It was nice to see you.  Please make an appointment with Dr. Ashley Royalty to have your blood pressure rechecked.  I am giving you some penicillin and pain medicine for your tooth.  I'm glad you will be able to get it out in January!  Please come back if you feel like the antibiotic is not helping (it is getting more swollen, red, painful or you start having drainage from around your tooth).

## 2012-08-21 ENCOUNTER — Other Ambulatory Visit: Payer: Self-pay | Admitting: Family Medicine

## 2012-08-21 DIAGNOSIS — Z1231 Encounter for screening mammogram for malignant neoplasm of breast: Secondary | ICD-10-CM

## 2012-08-30 ENCOUNTER — Ambulatory Visit (HOSPITAL_COMMUNITY): Payer: Self-pay

## 2012-09-28 ENCOUNTER — Other Ambulatory Visit: Payer: Self-pay | Admitting: Family Medicine

## 2012-11-12 ENCOUNTER — Encounter: Payer: 59 | Admitting: Family Medicine

## 2012-11-20 ENCOUNTER — Ambulatory Visit (INDEPENDENT_AMBULATORY_CARE_PROVIDER_SITE_OTHER): Payer: 59 | Admitting: Family Medicine

## 2012-11-20 ENCOUNTER — Encounter: Payer: Self-pay | Admitting: Family Medicine

## 2012-11-20 ENCOUNTER — Other Ambulatory Visit (HOSPITAL_COMMUNITY)
Admission: RE | Admit: 2012-11-20 | Discharge: 2012-11-20 | Disposition: A | Discharge status: Home / Self Care | Payer: 59 | Source: Ambulatory Visit | Attending: Family Medicine | Admitting: Family Medicine

## 2012-11-20 VITALS — BP 127/78 | HR 58 | Ht 65.0 in | Wt 237.0 lb

## 2012-11-20 DIAGNOSIS — Z01419 Encounter for gynecological examination (general) (routine) without abnormal findings: Secondary | ICD-10-CM | POA: Insufficient documentation

## 2012-11-20 DIAGNOSIS — Z124 Encounter for screening for malignant neoplasm of cervix: Secondary | ICD-10-CM

## 2012-11-20 DIAGNOSIS — Z0000 Encounter for general adult medical examination without abnormal findings: Secondary | ICD-10-CM

## 2012-11-20 DIAGNOSIS — I10 Essential (primary) hypertension: Secondary | ICD-10-CM

## 2012-11-20 DIAGNOSIS — E669 Obesity, unspecified: Secondary | ICD-10-CM

## 2012-11-20 LAB — CBC
Hemoglobin: 13.1 g/dL (ref 12.0–15.0)
MCH: 27.9 pg (ref 26.0–34.0)
MCHC: 32.9 g/dL (ref 30.0–36.0)
MCV: 84.9 fL (ref 78.0–100.0)
Platelets: 357 10*3/uL (ref 150–400)
RBC: 4.69 MIL/uL (ref 3.87–5.11)

## 2012-11-20 LAB — LIPID PANEL
Cholesterol: 151 mg/dL (ref 0–200)
LDL Cholesterol: 83 mg/dL (ref 0–99)
Triglycerides: 122 mg/dL (ref <150)

## 2012-11-20 LAB — COMPREHENSIVE METABOLIC PANEL
AST: 29 U/L (ref 0–37)
Albumin: 4.3 g/dL (ref 3.5–5.2)
Alkaline Phosphatase: 59 U/L (ref 39–117)
BUN: 14 mg/dL (ref 6–23)
CO2: 29 mEq/L (ref 19–32)
Chloride: 102 mEq/L (ref 96–112)
Creat: 0.61 mg/dL (ref 0.50–1.10)
Total Protein: 7.1 g/dL (ref 6.0–8.3)

## 2012-11-20 MED ORDER — SERTRALINE HCL 50 MG PO TABS: 50.0000 mg | ORAL_TABLET | Freq: Every day | ORAL | Status: DC

## 2012-11-20 NOTE — Progress Notes (Signed)
Patient ID: Sheryl Cooper, female   DOB: May 01, 1961, 52 y.o.   MRN: [id number] SUBJECTIVE:  52 y.o. female for annual routine Pap and checkup. Perimenopausal, no period since December Reports problem of increased anxiety and difficulty sleeping.  Has had this problem in the past, unsure of prior treatment.  Anxiety stemming from her children moving back home  And dealing with them.  This has led to insomnia which has made her anxiety worse.  Does have some symptoms of panic including feeling of palpitations and shortness of breath that typically subsides pretty quickly.  She is interested in a medication to help with this.    Current Outpatient Prescriptions  Medication Sig Dispense Refill  . lisinopril-hydrochlorothiazide (PRINZIDE,ZESTORETIC) 20-25 MG per tablet TAKE 1 TABLET BY MOUTH DAILY.  30 tablet  PRN   No current facility-administered medications for this visit.   Allergies: Review of patient's allergies indicates no known allergies.  Patient's last menstrual period was 08/01/2012.  ROS:  Feeling well. No dyspnea or chest pain on exertion.  No abdominal pain, change in bowel habits, black or bloody stools.  No urinary tract symptoms. GYN ROS: normal menses, no abnormal bleeding, pelvic pain or discharge, no breast pain or new or enlarging lumps on self exam. No neurological complaints.  OBJECTIVE:  The patient appears well, alert, oriented x 3, in no distress. BP 127/78  Pulse 58  Ht 5\' 5"  (1.651 m)  Wt 237 lb (107.502 kg)  BMI 39.44 kg/m2  LMP 08/01/2012 ENT normal.  Neck supple. No adenopathy or thyromegaly. PERLA. Lungs are clear, good air entry, no wheezes, rhonchi or rales. S1 and S2 normal, no murmurs, regular rate and rhythm. Abdomen soft without tenderness, guarding, mass or organomegaly. Extremities show no edema, normal peripheral pulses. Neurological is normal, no focal findings.  PELVIC EXAM: normal external genitalia, vulva, vagina, cervix, uterus and adnexa, PAP:  Pap smear done today, exam chaperoned by Danella Sensing  ASSESSMENT:  well woman Anxiety with features of panic  PLAN:  mammogram pap smear counseled on mammography screening and menopause additional lab tests per orders Instructed to schedule screening colonoscopy  Discussed sleep hygiene and trial of melatonin or valerian root for sleep as she is looking for OTC natural  remedy.  Will give a trial of sertraline for anxiety, follow up in 2-4 weeks.

## 2012-11-20 NOTE — Patient Instructions (Addendum)
Thank you for coming in today, it was nice to meet you I would suggest trying melatonin or valerian root for sleep if you are looking for an over the counter remedy If your symptoms continue follow up with me Schedule your colonoscopy and mammogram

## 2012-11-21 ENCOUNTER — Other Ambulatory Visit: Payer: Self-pay

## 2012-11-21 DIAGNOSIS — Z1231 Encounter for screening mammogram for malignant neoplasm of breast: Secondary | ICD-10-CM

## 2012-11-27 ENCOUNTER — Ambulatory Visit: Payer: 59

## 2013-01-22 ENCOUNTER — Encounter: Payer: Self-pay | Admitting: Family Medicine

## 2013-01-24 MED ORDER — SERTRALINE HCL 100 MG PO TABS: 100.0000 mg | ORAL_TABLET | Freq: Every day | ORAL | Status: DC

## 2013-01-24 MED ORDER — TRAMADOL HCL 50 MG PO TABS: 50.0000 mg | ORAL_TABLET | Freq: Three times a day (TID) | ORAL | Status: DC | PRN

## 2013-01-29 ENCOUNTER — Other Ambulatory Visit: Payer: Self-pay | Admitting: Family Medicine

## 2013-01-29 ENCOUNTER — Telehealth: Payer: Self-pay | Admitting: Family Medicine

## 2013-01-29 DIAGNOSIS — Z1231 Encounter for screening mammogram for malignant neoplasm of breast: Secondary | ICD-10-CM

## 2013-01-29 NOTE — Telephone Encounter (Signed)
Sheryl Cooper,  [name], is in the front office, replace [name], is your patient, and she used to see Dr. Ashley Royalty, he increased her Zoloft to 100mg  and she is asking for you to decrease to 75mg  because she is having side effects like nausea and diarrhea.  She spoke to [employer], RN, the triage nurse, and she suggested that she speak to you about decreasing the dose.  [name]

## 2013-01-29 NOTE — Telephone Encounter (Signed)
I'm in clinic on Thursday, so I'll speak to her then about it. Thanks! --CMS

## 2013-02-12 ENCOUNTER — Ambulatory Visit (HOSPITAL_COMMUNITY)
Admission: RE | Admit: 2013-02-12 | Discharge: 2013-02-12 | Disposition: A | Discharge status: Home / Self Care | Payer: 59 | Source: Ambulatory Visit | Attending: Family Medicine | Admitting: Family Medicine

## 2013-02-12 DIAGNOSIS — Z1231 Encounter for screening mammogram for malignant neoplasm of breast: Secondary | ICD-10-CM | POA: Insufficient documentation

## 2013-04-08 ENCOUNTER — Encounter: Payer: Self-pay | Admitting: Family Medicine

## 2013-04-08 ENCOUNTER — Ambulatory Visit (INDEPENDENT_AMBULATORY_CARE_PROVIDER_SITE_OTHER): Payer: 59 | Admitting: Family Medicine

## 2013-04-08 VITALS — BP 115/74 | HR 79 | Temp 98.5°F | Wt 222.0 lb

## 2013-04-08 DIAGNOSIS — H109 Unspecified conjunctivitis: Secondary | ICD-10-CM

## 2013-04-08 NOTE — Assessment & Plan Note (Signed)
Viral conjunctivitis of the right eye -will treat conservatively with warm compresses and artificial tears -patient told to notify physician if she develops thick white discharge as this could represent a bacterial infection

## 2013-04-08 NOTE — Progress Notes (Signed)
  Subjective:    Patient ID: Sheryl Cooper, female    DOB: 1960-12-14, 52 y.o.   MRN: [id number]  HPI 52 y/o female presents for acute visit. She has had a itchy/watery right eye for the past 24 hours, she is concerned that she may have "pink eye", her eye is red on the right side, no associated rhinorrhea or congestion, no fevers or chills, no sick contacts, no eye pain   Review of Systems  Constitutional: Negative for fever and chills.  Eyes: Positive for discharge, redness and itching. Negative for pain.  Respiratory: Negative for cough, choking and shortness of breath.   Cardiovascular: Negative for chest pain.  Gastrointestinal: Negative for nausea, vomiting and diarrhea.       Objective:   Physical Exam  Vitals: reviewed HEENT: normocephalic, PERRL, EOMI, scleral erythema present of the right eye, watery discharge present in the right eye, no pus, MMM, neck was supple Cardiac: RRR, S1 and S2 present, no murmurs Resp: CTAB, normal effort      Assessment & Plan:  Please see problem specific assessment and plan.

## 2013-04-08 NOTE — Patient Instructions (Signed)
Conjunctivitis Conjunctivitis is commonly called "pink eye." Conjunctivitis can be caused by bacterial or viral infection, allergies, or injuries. There is usually redness of the lining of the eye, itching, discomfort, and sometimes discharge. There may be deposits of matter along the eyelids. A viral infection usually causes a watery discharge, while a bacterial infection causes a yellowish, thick discharge. Pink eye is very contagious and spreads by direct contact. You may be given antibiotic eyedrops as part of your treatment. Before using your eye medicine, remove all drainage from the eye by washing gently with warm water and cotton balls. Continue to use the medication until you have awakened 2 mornings in a row without discharge from the eye. Do not rub your eye. This increases the irritation and helps spread infection. Use separate towels from other household members. Wash your hands with soap and water before and after touching your eyes. Use cold compresses to reduce pain and sunglasses to relieve irritation from light. Do not wear contact lenses or wear eye makeup until the infection is gone. SEEK MEDICAL CARE IF:   Your symptoms are not better after 3 days of treatment.  You have increased pain or trouble seeing.  The outer eyelids become very red or swollen. Document Released: 08/25/2004 Document Revised: 10/10/2011 Document Reviewed: 07/18/2005 [facility] Patient Information 2014 [place], [place].  May use over the counter eye drops/artificial tears up to 4 times daily to rinse the eye. May also use warm compresses to the affected eye. Wash your hands frequently.

## 2013-05-09 ENCOUNTER — Encounter: Payer: Self-pay | Admitting: Internal Medicine

## 2013-05-15 ENCOUNTER — Ambulatory Visit: Payer: 59

## 2013-05-15 ENCOUNTER — Ambulatory Visit (INDEPENDENT_AMBULATORY_CARE_PROVIDER_SITE_OTHER): Payer: 59 | Admitting: Family Medicine

## 2013-05-15 VITALS — BP 92/58 | HR 89 | Temp 98.8°F | Resp 18 | Ht 65.5 in | Wt 221.2 lb

## 2013-05-15 DIAGNOSIS — R059 Cough, unspecified: Secondary | ICD-10-CM

## 2013-05-15 DIAGNOSIS — R0981 Nasal congestion: Secondary | ICD-10-CM

## 2013-05-15 DIAGNOSIS — R05 Cough: Secondary | ICD-10-CM

## 2013-05-15 DIAGNOSIS — J029 Acute pharyngitis, unspecified: Secondary | ICD-10-CM

## 2013-05-15 DIAGNOSIS — J3489 Other specified disorders of nose and nasal sinuses: Secondary | ICD-10-CM

## 2013-05-15 LAB — POCT CBC
Granulocyte percent: 62.8 %G (ref 37–80)
HCT, POC: 34.1 % — AB (ref 37.7–47.9)
Hemoglobin: 10.5 g/dL — AB (ref 12.2–16.2)
Lymph, poc: 1.7 (ref 0.6–3.4)
MCH, POC: 27.6 pg (ref 27–31.2)
MCHC: 30.8 g/dL — AB (ref 31.8–35.4)
MCV: 89.6 fL (ref 80–97)
MID (cbc): 0.5 (ref 0–0.9)
MPV: 8.8 fL (ref 0–99.8)
POC Granulocyte: 3.8 (ref 2–6.9)
POC LYMPH PERCENT: 29 %L (ref 10–50)
POC MID %: 8.2 %M (ref 0–12)
Platelet Count, POC: 127 10*3/uL — AB (ref 142–424)
RBC: 3.81 M/uL — AB (ref 4.04–5.48)
RDW, POC: 15 %
WBC: 6 10*3/uL (ref 4.6–10.2)

## 2013-05-15 MED ORDER — HYDROCOD POLST-CHLORPHEN POLST 10-8 MG/5ML PO LQCR: 5.0000 mL | Freq: Two times a day (BID) | ORAL | Status: DC | PRN

## 2013-05-15 MED ORDER — METHYLPREDNISOLONE (PAK) 4 MG PO TABS: ORAL_TABLET | ORAL | Status: DC

## 2013-05-15 MED ORDER — AZITHROMYCIN 250 MG PO TABS: ORAL_TABLET | ORAL | Status: DC

## 2013-05-15 NOTE — Progress Notes (Signed)
Reviewed documentation and xray and agree w/ assessment and plan. Lasundra Hascall, MD MPH   

## 2013-05-15 NOTE — Progress Notes (Signed)
  Subjective:    Patient ID: Sheryl Cooper, female    DOB: 18-Jun-1961, 52 y.o.   MRN: [id number]  Cough Associated symptoms include ear pain (bilateral fullness), postnasal drip, a sore throat and shortness of breath. Pertinent negatives include no chest pain, headaches or wheezing.  Sore Throat  Associated symptoms include congestion, coughing, ear pain (bilateral fullness) and shortness of breath. Pertinent negatives include no headaches or vomiting.   52 year old female presents with 5 day history of nasal congestion, PND, cough, chest congestion, bilateral ear fullness, and sore throat.  States symptoms seem to have progressively gotten worse and have not responded to OTC treatment. Admits to "feeling hot" but has not had any documented fever or chills. Has been taking Robitussin, Mucinex, and Alka Seltzer.  Has had several sick contacts.  Admits to SOB but no wheezing or chest pain.  Has had multiple "coughing attacks" that have interfered with her work.   She is a former smoker.  Hx of HTN controlled with lisinopril. No hx of DM.  Patient is otherwise doing well with no other concerns today.     Review of Systems  HENT: Positive for congestion, ear pain (bilateral fullness), postnasal drip and sore throat. Negative for sinus pressure.   Respiratory: Positive for cough and shortness of breath. Negative for chest tightness and wheezing.   Cardiovascular: Negative for chest pain.  Gastrointestinal: Negative for nausea and vomiting.  Neurological: Negative for dizziness and headaches.       Objective:   Physical Exam  Constitutional: She is oriented to person, place, and time. She appears well-developed and well-nourished.  HENT:  Head: Normocephalic and atraumatic.  Right Ear: Hearing, tympanic membrane, external ear and ear canal normal.  Left Ear: Hearing, tympanic membrane and ear canal normal.  Mouth/Throat: Uvula is midline and mucous membranes are normal. Posterior  oropharyngeal erythema (clear postnasal drainage) present. No oropharyngeal exudate, posterior oropharyngeal edema or tonsillar abscesses.  Eyes: Conjunctivae are normal.  Neck: Normal range of motion. Neck supple.  Cardiovascular: Normal rate, regular rhythm and normal heart sounds.   Pulmonary/Chest: Effort normal and breath sounds normal.  Lymphadenopathy:    She has no cervical adenopathy.  Neurological: She is alert and oriented to person, place, and time.  Psychiatric: She has a normal mood and affect. Her behavior is normal. Judgment and thought content normal.      [facility] reading (PRIMARY) by  Dr. Clelia Croft as no acute infiltrate or consolidation.       Assessment & Plan:   Cough - Plan: DG Chest 2 View, POCT CBC, azithromycin (ZITHROMAX) 250 MG tablet, methylPREDNIsolone (MEDROL DOSPACK) 4 MG tablet, chlorpheniramine-HYDROcodone (TUSSIONEX PENNKINETIC ER) 10-8 MG/5ML LQCR  Nasal congestion  Acute pharyngitis  Will cover with a Zpack. Start Medrol dose pack tomorrow Tussionex qhs prn cough.  Increase fluids and rest Continue Mucinex as directed Follow up if symptoms worsen or fail to improve.

## 2013-05-22 ENCOUNTER — Encounter: Payer: Self-pay | Admitting: Family Medicine

## 2013-05-27 ENCOUNTER — Other Ambulatory Visit: Payer: Self-pay | Admitting: Family Medicine

## 2013-05-28 ENCOUNTER — Ambulatory Visit (INDEPENDENT_AMBULATORY_CARE_PROVIDER_SITE_OTHER): Payer: 59 | Admitting: Family Medicine

## 2013-05-28 ENCOUNTER — Encounter: Payer: Self-pay | Admitting: Family Medicine

## 2013-05-28 VITALS — BP 133/93 | HR 71 | Temp 98.4°F | Wt 221.0 lb

## 2013-05-28 DIAGNOSIS — G2581 Restless legs syndrome: Secondary | ICD-10-CM

## 2013-05-28 DIAGNOSIS — G4700 Insomnia, unspecified: Secondary | ICD-10-CM

## 2013-05-28 MED ORDER — ROPINIROLE HCL 0.25 MG PO TABS: 0.2500 mg | ORAL_TABLET | Freq: Three times a day (TID) | ORAL | Status: DC

## 2013-05-28 MED ORDER — TRAMADOL HCL 50 MG PO TABS: 50.0000 mg | ORAL_TABLET | Freq: Three times a day (TID) | ORAL | Status: DC | PRN

## 2013-05-28 MED ORDER — SERTRALINE HCL 100 MG PO TABS: 150.0000 mg | ORAL_TABLET | Freq: Every day | ORAL | Status: DC

## 2013-05-28 NOTE — Assessment & Plan Note (Signed)
Patient presents for evaluation of insomnia that is no longer lead with over-the-counter sleep aids. -Proper sleep hygiene was discussed with the patient including not reading in bed prior to sleeping as well as exercising earlier in the day. -She has previously had relief of her symptoms with melatonin will reattempt trial, patient is hesitant to initiate a stronger sleep aid -Suspect that her restless leg syndrome may be contributing to her insomnia

## 2013-05-28 NOTE — Patient Instructions (Signed)
Insomnia Insomnia is frequent trouble falling and/or staying asleep. Insomnia can be a long term problem or a short term problem. Both are common. Insomnia can be a short term problem when the wakefulness is related to a certain stress or worry. Long term insomnia is often related to ongoing stress during waking hours and/or poor sleeping habits. Overtime, sleep deprivation itself can make the problem worse. Every little thing feels more severe because you are overtired and your ability to cope is decreased. CAUSES   Stress, anxiety, and depression.  Poor sleeping habits.  Distractions such as TV in the bedroom.  Naps close to bedtime.  Engaging in emotionally charged conversations before bed.  Technical reading before sleep.  Alcohol and other sedatives. They may make the problem worse. They can hurt normal sleep patterns and normal dream activity.  Stimulants such as caffeine for several hours prior to bedtime.  Pain syndromes and shortness of breath can cause insomnia.  Exercise late at night.  Changing time zones may cause sleeping problems (jet lag). It is sometimes helpful to have someone observe your sleeping patterns. They should look for periods of not breathing during the night (sleep apnea). They should also look to see how long those periods last. If you live alone or observers are uncertain, you can also be observed at a sleep clinic where your sleep patterns will be professionally monitored. Sleep apnea requires a checkup and treatment. Give your caregivers your medical history. Give your caregivers observations your family has made about your sleep.  SYMPTOMS   Not feeling rested in the morning.  Anxiety and restlessness at bedtime.  Difficulty falling and staying asleep. TREATMENT   Your caregiver may prescribe treatment for an underlying medical disorders. Your caregiver can give advice or help if you are using alcohol or other drugs for self-medication. Treatment  of underlying problems will usually eliminate insomnia problems.  Medications can be prescribed for short time use. They are generally not recommended for lengthy use.  Over-the-counter sleep medicines are not recommended for lengthy use. They can be habit forming.  You can promote easier sleeping by making lifestyle changes such as:  Using relaxation techniques that help with breathing and reduce muscle tension.  Exercising earlier in the day.  Changing your diet and the time of your last meal. No night time snacks.  Establish a regular time to go to bed.  Counseling can help with stressful problems and worry.  Soothing music and white noise may be helpful if there are background noises you cannot remove.  Stop tedious detailed work at least one hour before bedtime. HOME CARE INSTRUCTIONS   Keep a diary. Inform your caregiver about your progress. This includes any medication side effects. See your caregiver regularly. Take note of:  Times when you are asleep.  Times when you are awake during the night.  The quality of your sleep.  How you feel the next day. This information will help your caregiver care for you.  Get out of bed if you are still awake after 15 minutes. Read or do some quiet activity. Keep the lights down. Wait until you feel sleepy and go back to bed.  Keep regular sleeping and waking hours. Avoid naps.  Exercise regularly.  Avoid distractions at bedtime. Distractions include watching television or engaging in any intense or detailed activity like attempting to balance the household checkbook.  Develop a bedtime ritual. Keep a familiar routine of bathing, brushing your teeth, climbing into bed at the same   time each night, listening to soothing music. Routines increase the success of falling to sleep faster.  Use relaxation techniques. This can be using breathing and muscle tension release routines. It can also include visualizing peaceful scenes. You can  also help control troubling or intruding thoughts by keeping your mind occupied with boring or repetitive thoughts like the old concept of counting sheep. You can make it more creative like imagining planting one beautiful flower after another in your backyard garden.  During your day, work to eliminate stress. When this is not possible use some of the previous suggestions to help reduce the anxiety that accompanies stressful situations. MAKE SURE YOU:   Understand these instructions.  Will watch your condition.  Will get help right away if you are not doing well or get worse. Document Released: 07/15/2000 Document Revised: 10/10/2011 Document Reviewed: 08/15/2007 [facility] Patient Information 2014 [place], [place]. Restless Legs Syndrome Restless legs syndrome is a movement disorder. It may also be called a sensori-motor disorder.  CAUSES  No one knows what specifically causes restless legs syndrome, but it tends to run in families. It is also more common in people with low iron, in pregnancy, in people who need dialysis, and those with nerve damage (neuropathy).Some medications may make restless legs syndrome worse.Those medications include drugs to treat high blood pressure, some heart conditions, nausea, colds, allergies, and depression. SYMPTOMS Symptoms include uncomfortable sensations in the legs. These leg sensations are worse during periods of inactivity or rest. They are also worse while sitting or lying down. Individuals that have the disorder describe sensations in the legs that feel like:  Pulling.  Drawing.  Crawling.  Worming.  Boring.  Tingling.  Pins and needles.  Prickling.  Pain. The sensations are usually accompanied by an overwhelming urge to move the legs. Sudden muscle jerks may also occur. Movement provides temporary relief from the discomfort. In rare cases, the arms may also be affected. Symptoms may interfere with going to sleep (sleep onset insomnia).  Restless legs syndrome may also be related to periodic limb movement disorder (PLMD). PLMD is another more common motor disorder. It also causes interrupted sleep. The symptoms from PLMD usually occur most often when you are awake. TREATMENT  Treatment for restless legs syndrome is symptomatic. This means that the symptoms are treated.   Massage and cold compresses may provide temporary relief.  Walk, stretch, or take a cold or hot bath.  Get regular exercise and a good night's sleep.  Avoid caffeine, alcohol, nicotine, and medications that can make it worse.  Do activities that provide mental stimulation like discussions, needlework, and video games. These may be helpful if you are not able to walk or stretch. Some medications are effective in relieving the symptoms. However, many of these medications have side effects. Ask your caregiver about medications that may help your symptoms. Correcting iron deficiency may improve symptoms for some patients. Document Released: 07/08/2002 Document Revised: 10/10/2011 Document Reviewed: 10/14/2010 [facility] Patient Information 2014 [place], [place].

## 2013-05-28 NOTE — Progress Notes (Signed)
  Subjective:    Patient ID: Sheryl Cooper, female    DOB: 1960-12-12, 52 y.o.   MRN: [id number]  HPI 52 year old female presents for evaluation of insomnia. She has previously been evaluated for this at [facility]. She has previously had success with melatonin as well as over-the-counter sleeping aids. These have no longer been working. She states that she has difficulty with both sleep initiation as well as maintenance of sleep throughout the evening. Most evenings she gets only a few hours of sleep, she states that she sleeps in a cold room, she occasionally will Sharber for blood which does provide some benefit, she occasionally will exercise prior to bed thinking this would make her more drowsy, she does often read in bed prior to attempting sleep, she does report a history of right lower extremity irritation that is resolved with moving the lower extremity, this occurs mainly when she is attempting to fall asleep, no associated numbness or tingling in the lower extremity throughout the day   Review of Systems  Constitutional: Negative for fever, chills and fatigue.  Respiratory: Negative for apnea and chest tightness.   Cardiovascular: Negative for chest pain.  Neurological: Positive for headaches.       Objective:   Physical Exam Vitals: Reviewed General: Pleasant Caucasian female, no acute distress Cardiac: Regular in rhythm, S1 and S2 present, no murmurs, no heaves or thrills Respiratory: Clear to patient bilaterally, normal effort Extremities: No edema Skin: No rash       Assessment & Plan:  Please see problem specific assessment and plan.

## 2013-05-28 NOTE — Assessment & Plan Note (Signed)
Patient has symptoms consistent with restless leg syndrome of the right lower extremity. Most recent CBC showed normal hemoglobin therefore will not check ferritin at this time. -Will attempt trial of low-dose Ropinorole.

## 2013-06-06 ENCOUNTER — Other Ambulatory Visit: Payer: Self-pay

## 2013-06-17 ENCOUNTER — Other Ambulatory Visit: Payer: Self-pay | Admitting: Family Medicine

## 2013-07-01 HISTORY — PX: COLONOSCOPY: SHX174

## 2013-07-02 ENCOUNTER — Ambulatory Visit (AMBULATORY_SURGERY_CENTER): Payer: Self-pay | Admitting: *Deleted

## 2013-07-02 VITALS — Ht 65.0 in | Wt 230.0 lb

## 2013-07-02 DIAGNOSIS — Z1211 Encounter for screening for malignant neoplasm of colon: Secondary | ICD-10-CM

## 2013-07-02 MED ORDER — MOVIPREP 100 G PO SOLR: ORAL | Status: DC

## 2013-07-02 NOTE — Progress Notes (Signed)
No allergies to eggs or soy. No problems with anesthesia.  

## 2013-07-09 ENCOUNTER — Encounter: Payer: Self-pay | Admitting: Family Medicine

## 2013-07-10 ENCOUNTER — Other Ambulatory Visit: Payer: Self-pay | Admitting: Family Medicine

## 2013-07-10 MED ORDER — ROPINIROLE HCL 0.25 MG PO TABS: 0.5000 mg | ORAL_TABLET | Freq: Every day | ORAL | Status: DC

## 2013-07-10 NOTE — Telephone Encounter (Signed)
Patient has now started to have restless legs at night, would like to increase dose. Will attempt trial of 0.5 mg QHS.

## 2013-07-16 ENCOUNTER — Ambulatory Visit (AMBULATORY_SURGERY_CENTER): Payer: 59 | Admitting: Internal Medicine

## 2013-07-16 ENCOUNTER — Encounter: Payer: Self-pay | Admitting: Internal Medicine

## 2013-07-16 VITALS — BP 117/63 | HR 64 | Temp 97.2°F | Resp 25 | Ht 65.0 in | Wt 230.0 lb

## 2013-07-16 DIAGNOSIS — Z1211 Encounter for screening for malignant neoplasm of colon: Secondary | ICD-10-CM

## 2013-07-16 DIAGNOSIS — D126 Benign neoplasm of colon, unspecified: Secondary | ICD-10-CM

## 2013-07-16 MED ORDER — SODIUM CHLORIDE 0.9 % IV SOLN: 500.0000 mL | INTRAVENOUS | Status: DC

## 2013-07-16 NOTE — Progress Notes (Signed)
Patient did not have preoperative order for IV antibiotic SSI prophylaxis. (G8918)  Patient did not experience any of the following events: a burn prior to discharge; a fall within the facility; wrong site/side/patient/procedure/implant event; or a hospital transfer or hospital admission upon discharge from the facility. (G8907)  

## 2013-07-16 NOTE — Op Note (Signed)
[facility] [address] [place], [address]   COLONOSCOPY PROCEDURE REPORT  PATIENT: Sheryl Cooper, Sheryl Cooper  MR#: [id number] BIRTHDATE: 1961/07/29 , 52  yrs. old GENDER: Female ENDOSCOPIST: Roxy Cedar, MD REFERRED BY:.[facility]) PROCEDURE DATE:  07/16/2013 PROCEDURE:   Colonoscopy with snare polypectomy x 1 First Screening Colonoscopy - Avg.  risk and is 50 yrs.  old or older Yes.  Prior Negative Screening - Now for repeat screening. N/A  History of Adenoma - Now for follow-up colonoscopy & has been > or = to 3 yrs.  N/A  Polyps Removed Today? Yes. ASA CLASS:   Class II INDICATIONS:average risk screening. MEDICATIONS: MAC sedation, administered by CRNA and propofol (Diprivan) 450mg  IV  DESCRIPTION OF PROCEDURE:   After the risks benefits and alternatives of the procedure were thoroughly explained, informed consent was obtained.  A digital rectal exam revealed no abnormalities of the rectum.   The LB [id number]  endoscope was introduced through the anus and advanced to the cecum, which was identified by both the appendix and ileocecal valve. No adverse events experienced.   The quality of the prep was good, using MoviPrep  The instrument was then slowly withdrawn as the colon was fully examined.   COLON FINDINGS: A diminutive polyp was found at the ileocecal valve. A polypectomy was performed with a cold snare.  The resection was complete and the polyp tissue was completely retrieved.   Moderate diverticulosis was noted throughout the entire examined colon. The colon mucosa was otherwise normal.  Retroflexed views revealed internal hemorrhoids. The time to cecum=3 minutes 22 seconds. Withdrawal time=12 minutes 57 seconds.  The scope was withdrawn and the procedure completed. COMPLICATIONS: There were no complications.  ENDOSCOPIC IMPRESSION: 1.   Diminutive polyp was found at the ileocecal valve; polypectomy was performed with  a cold snare 2.   Moderate diverticulosis was noted throughout the entire examined colon 3.   The colon mucosa was otherwise normal  RECOMMENDATIONS: 1. Repeat colonoscopy in 5 years if polyp adenomatous; otherwise 10 years   eSigned:  Roxy Cedar, MD 07/16/2013 12:09 PM   cc: The Patient    ; Donnella Sham, MD [facility])

## 2013-07-16 NOTE — Progress Notes (Signed)
Called to room to assist during endoscopic procedure.  Patient ID and intended procedure confirmed with present staff. Received instructions for my participation in the procedure from the performing physician.  

## 2013-07-16 NOTE — Progress Notes (Signed)
Report to pacu rn, vss, bbs=clear 

## 2013-07-16 NOTE — Patient Instructions (Signed)
YOU HAD AN ENDOSCOPIC PROCEDURE TODAY AT THE [facility]: Refer to the procedure report that was given to you for any specific questions about what was found during the examination.  If the procedure report does not answer your questions, please call your gastroenterologist to clarify.  If you requested that your care partner not be given the details of your procedure findings, then the procedure report has been included in a sealed envelope for you to review at your convenience later.  YOU SHOULD EXPECT: Some feelings of bloating in the abdomen. Passage of more gas than usual.  Walking can help get rid of the air that was put into your GI tract during the procedure and reduce the bloating. If you had a lower endoscopy (such as a colonoscopy or flexible sigmoidoscopy) you may notice spotting of blood in your stool or on the toilet paper. If you underwent a bowel prep for your procedure, then you may not have a normal bowel movement for a few days.  DIET: Your first meal following the procedure should be a light meal and then it is ok to progress to your normal diet.  A half-sandwich or bowl of soup is an example of a good first meal.  Heavy or fried foods are harder to digest and may make you feel nauseous or bloated.  Likewise meals heavy in dairy and vegetables can cause extra gas to form and this can also increase the bloating.  Drink plenty of fluids but you should avoid alcoholic beverages for 24 hours.  ACTIVITY: Your care partner should take you home directly after the procedure.  You should plan to take it easy, moving slowly for the rest of the day.  You can resume normal activity the day after the procedure however you should NOT DRIVE or use heavy machinery for 24 hours (because of the sedation medicines used during the test).    SYMPTOMS TO REPORT IMMEDIATELY: A gastroenterologist can be reached at any hour.  During normal business hours, 8:30 AM to 5:00 PM Monday through Friday,  call [phone].  After hours and on weekends, please call the GI answering service at [phone] who will take a message and have the physician on call contact you.   Following lower endoscopy (colonoscopy or flexible sigmoidoscopy):  Excessive amounts of blood in the stool  Significant tenderness or worsening of abdominal pains  Swelling of the abdomen that is new, acute  Fever of 100F or higher  FOLLOW UP: If any biopsies were taken you will be contacted by phone or by letter within the next 1-3 weeks.  Call your gastroenterologist if you have not heard about the biopsies in 3 weeks.  Our staff will call the home number listed on your records the next business day following your procedure to check on you and address any questions or concerns that you may have at that time regarding the information given to you following your procedure. This is a courtesy call and so if there is no answer at the home number and we have not heard from you through the emergency physician on call, we will assume that you have returned to your regular daily activities without incident.  SIGNATURES/CONFIDENTIALITY: You and/or your care partner have signed paperwork which will be entered into your electronic medical record.  These signatures attest to the fact that that the information above on your After Visit Summary has been reviewed and is understood.  Full responsibility of the confidentiality of this   discharge information lies with you and/or your care-partner.  Please continue your normal medications  Await pathology  Please read over handouts about polyps, diverticulosis and high fiber diets 

## 2013-07-17 ENCOUNTER — Telehealth: Payer: Self-pay

## 2013-07-17 NOTE — Telephone Encounter (Signed)
  Follow up Call-  Call back number 07/16/2013  Post procedure Call Back phone  # [phone]  Permission to leave phone message Yes     Patient questions:  Do you have a fever, pain , or abdominal swelling? no Pain Score  0 *  Have you tolerated food without any problems? yes  Have you been able to return to your normal activities? yes  Do you have any questions about your discharge instructions: Diet   no Medications  no Follow up visit  no  Do you have questions or concerns about your Care? no  Actions: * If pain score is 4 or above: No action needed, pain <4.

## 2013-07-20 ENCOUNTER — Emergency Department (HOSPITAL_BASED_OUTPATIENT_CLINIC_OR_DEPARTMENT_OTHER): Payer: 59

## 2013-07-20 ENCOUNTER — Emergency Department (HOSPITAL_BASED_OUTPATIENT_CLINIC_OR_DEPARTMENT_OTHER)
Admission: EM | Admit: 2013-07-20 | Discharge: 2013-07-20 | Disposition: A | Discharge status: Home / Self Care | Payer: 59 | Attending: Emergency Medicine | Admitting: Emergency Medicine

## 2013-07-20 ENCOUNTER — Encounter (HOSPITAL_BASED_OUTPATIENT_CLINIC_OR_DEPARTMENT_OTHER): Payer: Self-pay | Admitting: Emergency Medicine

## 2013-07-20 DIAGNOSIS — Z79899 Other long term (current) drug therapy: Secondary | ICD-10-CM | POA: Insufficient documentation

## 2013-07-20 DIAGNOSIS — R0602 Shortness of breath: Secondary | ICD-10-CM | POA: Insufficient documentation

## 2013-07-20 DIAGNOSIS — F411 Generalized anxiety disorder: Secondary | ICD-10-CM | POA: Insufficient documentation

## 2013-07-20 DIAGNOSIS — Z8739 Personal history of other diseases of the musculoskeletal system and connective tissue: Secondary | ICD-10-CM | POA: Insufficient documentation

## 2013-07-20 DIAGNOSIS — R42 Dizziness and giddiness: Secondary | ICD-10-CM | POA: Insufficient documentation

## 2013-07-20 DIAGNOSIS — R110 Nausea: Secondary | ICD-10-CM | POA: Insufficient documentation

## 2013-07-20 DIAGNOSIS — Z87891 Personal history of nicotine dependence: Secondary | ICD-10-CM | POA: Insufficient documentation

## 2013-07-20 DIAGNOSIS — H9319 Tinnitus, unspecified ear: Secondary | ICD-10-CM | POA: Insufficient documentation

## 2013-07-20 DIAGNOSIS — E669 Obesity, unspecified: Secondary | ICD-10-CM | POA: Insufficient documentation

## 2013-07-20 DIAGNOSIS — R072 Precordial pain: Secondary | ICD-10-CM | POA: Insufficient documentation

## 2013-07-20 DIAGNOSIS — R51 Headache: Secondary | ICD-10-CM | POA: Insufficient documentation

## 2013-07-20 DIAGNOSIS — R079 Chest pain, unspecified: Secondary | ICD-10-CM

## 2013-07-20 DIAGNOSIS — R61 Generalized hyperhidrosis: Secondary | ICD-10-CM | POA: Insufficient documentation

## 2013-07-20 DIAGNOSIS — I10 Essential (primary) hypertension: Secondary | ICD-10-CM | POA: Insufficient documentation

## 2013-07-20 LAB — BASIC METABOLIC PANEL
Calcium: 9.1 mg/dL (ref 8.4–10.5)
Chloride: 100 mEq/L (ref 96–112)
Creatinine, Ser: 0.7 mg/dL (ref 0.50–1.10)
GFR calc Af Amer: >90 mL/min (ref >90)
GFR calc non Af Amer: >90 mL/min (ref >90)
Sodium: 138 mEq/L (ref 135–145)

## 2013-07-20 LAB — CBC
Hemoglobin: 11.8 g/dL — ABNORMAL LOW (ref 12.0–15.0)
MCH: 27.6 pg (ref 26.0–34.0)
MCHC: 32.2 g/dL (ref 30.0–36.0)
MCV: 85.5 fL (ref 78.0–100.0)
Platelets: 329 10*3/uL (ref 150–400)
RDW: 14.6 % (ref 11.5–15.5)
WBC: 8.3 10*3/uL (ref 4.0–10.5)

## 2013-07-20 LAB — TROPONIN I: Troponin I: <0.3 ng/mL (ref <0.30)

## 2013-07-20 LAB — D-DIMER, QUANTITATIVE (NOT AT ARMC): D-Dimer, Quant: <0.27 ug/mL-FEU (ref 0.00–0.48)

## 2013-07-20 MED ORDER — GI COCKTAIL ~~LOC~~
30.0000 mL | Freq: Once | ORAL | Status: AC
  Administered 2013-07-20: 30 mL via ORAL
  Filled 2013-07-20: qty 30

## 2013-07-20 MED ORDER — ASPIRIN 325 MG PO TABS
325.0000 mg | ORAL_TABLET | Freq: Once | ORAL | Status: AC
  Administered 2013-07-20: 325 mg via ORAL
  Filled 2013-07-20: qty 1

## 2013-07-20 NOTE — ED Notes (Signed)
Centralized chest pain x 3 min.  Pt sitting in chair, became diaphoretic, sob, dizzy.  Pt states pain is burning sensation in chest radiating down both arms.

## 2013-07-20 NOTE — ED Notes (Signed)
Pt d/c home with family. No new rx given

## 2013-07-20 NOTE — ED Provider Notes (Signed)
CSN: [id number]     Arrival date & time 07/20/13  1751 History  This chart was scribed for Dagmar Hait, MD by Clydene Laming, ED Scribe. This patient was seen in room MH03/MH03 and the patient's care was started at 6:25 PM.   Chief Complaint  Patient presents with  . Chest Pain    Patient is a 52 y.o. female presenting with chest pain. The history is provided by the patient. No language interpreter was used.  Chest Pain Pain location:  Substernal area Pain quality: burning   Pain quality: not sharp   Pain radiates to:  L arm and R arm Pain radiates to the back: no   Pain severity:  Mild Onset quality:  Sudden Duration:  10 minutes Timing:  Constant Progression:  Partially resolved Chronicity:  New Context: at rest   Context: not breathing   Relieved by:  Nothing Worsened by:  Nothing tried Ineffective treatments:  Certain positions Associated symptoms: diaphoresis, dizziness, headache, nausea and shortness of breath   Associated symptoms: no abdominal pain and not vomiting   Risk factors: hypertension    HPI Comments: Sheryl Cooper is a 52 y.o. female who presents to the Emergency Department complaining of centralized chest pain onset today. Pt was sitting in the chair holding her grandson around 5:30 PM. She sat her grandson down and pain began to worsen. Pt felt a burning sensation that radiated through the chest and to her arms. She became diaphoretic, nauseas, and short of breath. The episode last for about 10 mins. She also has a headache and ringing in the ears. Pt tried to change positions in the chair which did not improve symptoms. Pt did not consume anything out of the ordinary today. Pt has had reflux symptoms previously and states this is not what it feels like. Pt has a hx of hypertension, obesity, anxiety, and arthritis.   Past Medical History  Diagnosis Date  . Hypertension   . Anxiety   . Arthritis    Past Surgical History  Procedure Laterality  Date  . Tonsillectomy  1976  . Endometrial ablation    . Colonoscopy     Family History  Problem Relation Age of Onset  . Cancer Mother   . Diabetes Mother   . Heart disease Mother   . Hypertension Mother   . Colon cancer Neg Hx   . Esophageal cancer Neg Hx   . Stomach cancer Neg Hx   . Rectal cancer Neg Hx    History  Substance Use Topics  . Smoking status: Former Smoker    Quit date: 09/24/1993  . Smokeless tobacco: Never Used  . Alcohol Use: No   OB History   Grav Para Term Preterm Abortions TAB SAB Ect Mult Living                 Review of Systems  Constitutional: Positive for diaphoresis.  Respiratory: Positive for shortness of breath.   Cardiovascular: Positive for chest pain.  Gastrointestinal: Positive for nausea. Negative for vomiting, abdominal pain and diarrhea.  Neurological: Positive for dizziness and headaches.  All other systems reviewed and are negative.    Allergies  Review of patient's allergies indicates no known allergies.  Home Medications   Current Outpatient Rx  Name  Route  Sig  Dispense  Refill  . lisinopril-hydrochlorothiazide (PRINZIDE,ZESTORETIC) 20-25 MG per tablet      TAKE 1 TABLET BY MOUTH DAILY.   30 tablet   PRN   .  rOPINIRole (REQUIP) 0.25 MG tablet   Oral   Take 2 tablets (0.5 mg total) by mouth at bedtime. Take 1 tablet at bedtime as needed.   60 tablet   1   . sertraline (ZOLOFT) 100 MG tablet   Oral   Take 1.5 tablets (150 mg total) by mouth daily.   30 tablet   3   . traMADol (ULTRAM) 50 MG tablet   Oral   Take 1 tablet (50 mg total) by mouth every 8 (eight) hours as needed for pain.   30 tablet   1    Triage Vitals:BP 132/60  Pulse 75  Temp(Src) 98.6 F (37 C) (Oral)  Resp 16  Ht 5\' 5"  (1.651 m)  Wt 230 lb (104.327 kg)  BMI 38.27 kg/m2  SpO2 98% Physical Exam  Nursing note and vitals reviewed. Constitutional: She is oriented to person, place, and time. She appears well-developed and  well-nourished. No distress.  HENT:  Head: Normocephalic and atraumatic.  Eyes: EOM are normal.  Neck: Normal range of motion.  Cardiovascular: Normal rate, regular rhythm and normal heart sounds.   Pulmonary/Chest: Effort normal and breath sounds normal.  Abdominal: Soft. She exhibits no distension. There is no tenderness.  Musculoskeletal: Normal range of motion.  Neurological: She is alert and oriented to person, place, and time.  Skin: Skin is warm and dry.  Psychiatric: She has a normal mood and affect. Judgment normal.    ED Course  Procedures (including critical care time) DIAGNOSTIC STUDIES: Oxygen Saturation is 98% on RA, normal by my interpretation.    COORDINATION OF CARE: 6:30 PM- Discussed treatment plan with pt at bedside. Pt verbalized understanding and agreement with plan.   Labs Review Labs Reviewed - No data to display Imaging Review Dg Chest 2 View  07/20/2013   CLINICAL DATA:  Chest pain, shortness of breath, dizziness  EXAM: CHEST  2 VIEW  COMPARISON:  05/15/2013  FINDINGS: Chronic interstitial markings. No focal consolidation. No pleural effusion or pneumothorax.  The heart is normal in size.  Mild degenerative changes of the visualized thoracolumbar spine.  IMPRESSION: No evidence of acute cardiopulmonary disease.   Electronically Signed   By: Charline Bills M.D.   On: 07/20/2013 20:08    EKG Interpretation    Date/Time:  Saturday July 20 2013 17:56:41 EST Ventricular Rate:  77 PR Interval:  176 QRS Duration: 80 QT Interval:  402 QTC Calculation: 454 R Axis:   34 Text Interpretation:  Normal sinus rhythm Normal ECG No change from prior Confirmed by Gwendolyn Grant  MD, Mylan Lengyel ([id number]) on 07/20/2013 6:09:54 PM            MDM   1. Chest pain    I personally performed the services described in this documentation, which was scribed in my presence. The recorded information has been reviewed and is accurate.  51F here with atypical chest pain. 3  minutes of burning pain radiating to both arms. Associated diaphoresis and SOB. No N/V. Patient has no cardiac history.  EKG normal here. Exam benign. Labs normal. With atypical pain, serial troponin indicated. Serial troponin normal. Stable for discharge. Instructed to f/u with PCP for stress test.    Dagmar Hait, MD 07/21/13 [phone]

## 2013-07-23 ENCOUNTER — Encounter: Payer: Self-pay | Admitting: Family Medicine

## 2013-07-23 ENCOUNTER — Ambulatory Visit (INDEPENDENT_AMBULATORY_CARE_PROVIDER_SITE_OTHER): Payer: 59 | Admitting: Family Medicine

## 2013-07-23 VITALS — BP 130/79 | HR 70 | Ht 65.0 in | Wt 230.0 lb

## 2013-07-23 DIAGNOSIS — R079 Chest pain, unspecified: Secondary | ICD-10-CM

## 2013-07-23 MED ORDER — ASPIRIN 81 MG PO TABS: 81.0000 mg | ORAL_TABLET | Freq: Every day | ORAL | Status: DC

## 2013-07-23 NOTE — Patient Instructions (Signed)
A referral to cardiology has been made. You will be contacted when the referral has been processed.

## 2013-07-24 ENCOUNTER — Encounter: Payer: Self-pay | Admitting: Internal Medicine

## 2013-07-24 NOTE — Assessment & Plan Note (Signed)
Patient presents for hospital followup of chest discomfort. Workup in the emergency room was unremarkable. Patient is currently asymptomatic -Will refer to cardiology for possible stress test

## 2013-07-24 NOTE — Progress Notes (Signed)
   Subjective:    Patient ID: Sheryl Cooper, female    DOB: October 31, 1960, 52 y.o.   MRN: [id number]  HPI 52 year old Caucasian female presents for hospital emergency room followup. Patient was seen and evaluated on 07/20/2013 after she experienced a brief episode of chest pain at home. Patient states that she was sitting with her grandchild when she started to develop chest heaviness and pressure goes located in the middle of her chest, she did report some radiation of this chest discomfort to her bilateral arms, she did have associated shortness of breath and diaphoresis, she reported some ringing of her ears, she denies syncope or loss of consciousness, no jaw or back pain, no radiation of the pain to her back, she was seen and evaluated and [facility] emergency room where her workup was unremarkable, she states that the chest discomfort lasted for approximately 3-5 minutes and result spontaneously, the chest discomfort was not associated with activity  Patient reports that she does have a history of anxiety with mild panic attacks, she reports no anxiety prior to this episode, she has never had chest discomfort associated with panic attacks, she is currently taking Zoloft 150 mg daily and reports that her anxiety has been well-controlled for the past 6-9 months  She also reports a history of acid reflux however the symptoms were much more severe, she did not have associated burping or burning type sensation in her epigastrium  Patient is concerned that she may have cardiac disease as her mother experienced a heart attack around the same age as she is. She is interested in referral for a stress test.  Workup in the emergency room was unremarkable and patient was discharged home.  Social: Former smoker   Review of Systems  Constitutional: Negative for fever, chills and fatigue.  Respiratory: Negative for cough and shortness of breath.   Cardiovascular: Negative for chest pain, palpitations  and leg swelling.  Gastrointestinal: Negative for nausea, vomiting, diarrhea and constipation.       Objective:   Physical Exam Vitals: Reviewed General: Pleasant female, no acute distress HEENT: Normocephalic, pupils are equal round and reactive to light, extraocular movements are intact, no scleral icterus, was mucous membranes, no pharyngeal erythema or exudate, neck was supple Cardiac: Regular rate and rhythm, S1 and S2 present, no murmurs, no heaves or thrills, no carotid bruits Respiratory: Clear to auscultation bilaterally, normal effort Abdomen: Soft, nontender, bowel sounds present Extremities: No edema, 2+ radial pulses bilaterally  Reviewed lab work, chest x-ray, and EKG performed during emergency room visit.     Assessment & Plan:  Please see problem specific assessment and plan.

## 2013-08-21 ENCOUNTER — Other Ambulatory Visit: Payer: Self-pay | Admitting: Family Medicine

## 2013-08-21 ENCOUNTER — Encounter: Payer: Self-pay | Admitting: Family Medicine

## 2013-08-21 MED ORDER — ROPINIROLE HCL 0.25 MG PO TABS: 0.7500 mg | ORAL_TABLET | Freq: Every day | ORAL | Status: DC

## 2013-08-22 ENCOUNTER — Encounter: Payer: Self-pay | Admitting: Cardiovascular Disease

## 2013-08-22 ENCOUNTER — Encounter: Payer: Self-pay | Admitting: *Deleted

## 2013-08-23 ENCOUNTER — Ambulatory Visit: Payer: 59 | Admitting: Cardiovascular Disease

## 2013-08-28 ENCOUNTER — Encounter: Payer: Self-pay | Admitting: Family Medicine

## 2013-08-29 ENCOUNTER — Encounter: Payer: Self-pay | Admitting: Cardiovascular Disease

## 2013-09-30 ENCOUNTER — Other Ambulatory Visit: Payer: Self-pay | Admitting: Family Medicine

## 2013-09-30 MED ORDER — TRAMADOL HCL 50 MG PO TABS: 50.0000 mg | ORAL_TABLET | Freq: Three times a day (TID) | ORAL | Status: DC | PRN

## 2013-09-30 NOTE — Telephone Encounter (Signed)
Received fax from [facility] for Tramadol Prescription, refill provided vis "phone in"

## 2013-10-01 ENCOUNTER — Encounter: Payer: Self-pay | Admitting: Family Medicine

## 2013-10-16 ENCOUNTER — Encounter: Payer: Self-pay | Admitting: Family Medicine

## 2013-10-16 ENCOUNTER — Other Ambulatory Visit: Payer: Self-pay | Admitting: Family Medicine

## 2013-10-21 ENCOUNTER — Ambulatory Visit: Payer: 59 | Admitting: Family Medicine

## 2013-10-22 ENCOUNTER — Other Ambulatory Visit: Payer: Self-pay | Admitting: Family Medicine

## 2013-10-24 ENCOUNTER — Other Ambulatory Visit: Payer: Self-pay | Admitting: *Deleted

## 2013-10-25 ENCOUNTER — Ambulatory Visit (INDEPENDENT_AMBULATORY_CARE_PROVIDER_SITE_OTHER): Payer: 59 | Admitting: Family Medicine

## 2013-10-25 ENCOUNTER — Encounter: Payer: Self-pay | Admitting: Family Medicine

## 2013-10-25 VITALS — BP 105/77 | HR 81 | Temp 97.8°F

## 2013-10-25 DIAGNOSIS — E669 Obesity, unspecified: Secondary | ICD-10-CM

## 2013-10-25 DIAGNOSIS — F410 Panic disorder [episodic paroxysmal anxiety] without agoraphobia: Secondary | ICD-10-CM

## 2013-10-25 DIAGNOSIS — G2581 Restless legs syndrome: Secondary | ICD-10-CM

## 2013-10-25 DIAGNOSIS — I872 Venous insufficiency (chronic) (peripheral): Secondary | ICD-10-CM | POA: Insufficient documentation

## 2013-10-25 NOTE — Assessment & Plan Note (Signed)
Patient presents with leg swelling that is consistent with venous insufficiency. Previous lab work has identified normal kidney and liver function. No clinical signs or symptoms consistent with cardiac etiology at this time. Reviewed medication list and no culprits are identified. -Encourage elevation of legs during the day and compression stockings

## 2013-10-25 NOTE — Progress Notes (Signed)
   Subjective:    Patient ID: Sheryl Cooper, female    DOB: 29-Apr-1961, 53 y.o.   MRN: [id number]  HPI 53 y/o female presents for follow up of multiple medical issues.  Anxiety/Panic Attacks - worse over the past 1-2 months, currently on Zoloft 150 mg daily, she reports increased stress in her life, her youngest son attempted suicide last week and is not living with her, her daughter, granddaughter, and other son also live with her, last panic attack was last week, reports intermittent depression, no anhedonia, she enjoys to read, no homicidal or suicidal thoughts  Obesity - reports not exercising on a regular basis, plans to go to the gym at the hospital when it opens, she is no longer interested in Bariatric surgery, reports dietary indiscretions over the holiday season  Restless leg - takes Ropinirole 0.25 mg TID, symptoms mildly improved on Ropinirole, still has difficulty sleeping at times  Chronic leg swelling - does not wear compression stockings, no PND, no orthopnea   Review of Systems  Constitutional: Negative for fever, chills and fatigue.  HENT: Negative for congestion, postnasal drip and rhinorrhea.   Respiratory: Negative for cough, choking and chest tightness.   Cardiovascular: Positive for leg swelling. Negative for chest pain.  Gastrointestinal: Negative for nausea and diarrhea.       Objective:   Physical Exam Vitals: reviewed General: Pleasant female, no acute distress HEENT: Pupils are equal round and reactive to light, extraocular movements are intact, moist mucous members, neck was supple, no anterior or posterior cervical lymphadenopathy Cardiac: Regular in rhythm, S1 and S2 present, no murmurs, no heaves or thrills, no JVD Respiratory: Clear to patient bilaterally, normal effort Extremities: Trace bilateral pedal edema Psych: Appropriately dressed, laughing during exam, no SI, no HI, no tangential thought       Assessment & Plan:  Please see problem  specific assessment and plan.

## 2013-10-25 NOTE — Patient Instructions (Signed)
Restless Leg - take Ropinirole (3 tablets at night)  Anxiety - continue zoloft 150 daily, attempt to exercise 3 times a weeks for at least 30 minutes, Return to office in 4 weeks for follow up

## 2013-10-25 NOTE — Assessment & Plan Note (Signed)
Improved on Requip. -Patient counseled to take 3 tablets of 0.25 mg at nighttime

## 2013-10-25 NOTE — Assessment & Plan Note (Signed)
Weight stable from previous visit -Patient encouraged to exercise 3 times a week -Encouraged continuation of good eating habit

## 2013-10-25 NOTE — Assessment & Plan Note (Signed)
Patient has had worsening panic attacks over the past 1 to 2 months. This is likely due to multiple life stressors including her 3 children and grandchild living with her and the recent attempt by her son to commit suicide -Continue Zoloft -Patient not interested in referral to psychiatry at this time -Encourage time for herself and regular exercise

## 2013-11-05 ENCOUNTER — Ambulatory Visit (INDEPENDENT_AMBULATORY_CARE_PROVIDER_SITE_OTHER): Payer: 59 | Admitting: Family Medicine

## 2013-11-05 ENCOUNTER — Encounter: Payer: Self-pay | Admitting: Family Medicine

## 2013-11-05 ENCOUNTER — Ambulatory Visit (HOSPITAL_COMMUNITY)
Admission: RE | Admit: 2013-11-05 | Discharge: 2013-11-05 | Disposition: A | Discharge status: Home / Self Care | Payer: 59 | Source: Ambulatory Visit | Attending: Family Medicine | Admitting: Family Medicine

## 2013-11-05 VITALS — BP 126/78 | HR 81 | Ht 65.0 in

## 2013-11-05 DIAGNOSIS — M25569 Pain in unspecified knee: Secondary | ICD-10-CM

## 2013-11-05 DIAGNOSIS — M25561 Pain in right knee: Secondary | ICD-10-CM

## 2013-11-05 DIAGNOSIS — M179 Osteoarthritis of knee, unspecified: Secondary | ICD-10-CM | POA: Insufficient documentation

## 2013-11-05 DIAGNOSIS — M1710 Unilateral primary osteoarthritis, unspecified knee: Secondary | ICD-10-CM | POA: Insufficient documentation

## 2013-11-05 NOTE — Patient Instructions (Signed)
Right knee pain - get xrays of both knees (please have xrays taken while standing), ice daily (2-3 times for 10-15 minutes), may take Aleve twice daily, Tramadol for severe pain, continue walking regimen, consider wearing a knee brace while walking

## 2013-11-06 NOTE — Progress Notes (Signed)
   Subjective:    Patient ID: Sheryl Cooper, female    DOB: Feb 19, 1961, 53 y.o.   MRN: [id number]  HPI 53 y/o female presents for evaluation of right knee pain. She has had knee pain intermittently over the past months to years, worse over the past few weeks, pain in mostly over the anterior part of the knee, she reports some instability, no locking or clicking, no swellings, she states that she wants to "pop the knee" to make it feel better, she has attempted heat and ice which have provided some relief, Aleve provided relief as wells (usually takes 3-4 at a time), she takes Tramadol for severe pain. She is overweight however has lost over 100 pounds over the past year, she walks 2-3 times per week   Review of Systems  Constitutional: Negative for fever, chills and fatigue.  Musculoskeletal: Positive for arthralgias and joint swelling.       Objective:   Physical Exam Vitals: reviewed Gen: pleasant female, NAD MSK: Right knee - tenderness over the inferior patella, patellar tendon, and medial joint line, no joint effusion present, no crepitus with flexion/extension, good endpoint with lachman testing, normal varus and valgus stress testing, McMurry's negative for meniscal etiology, no bakers cyst present, pain with extreme flexion over anterior knee; Left knee - no tenderness, no joint effusion present, no crepitus with flexion/extension, good endpoint with lachman testing, normal varus and valgus stress testing, McMurry's negative for meniscal etiology, no bakers cyst present; Bilateral Hip - negative log roll, no pain with FABER and FADIR testing Neuro: strength 5/5 in lower extremities, sensation to light touch grossly intact Ext: no LE swelling, 2+ DP pulses      Assessment & Plan:  Please see problem specific assessment and plan.

## 2013-11-06 NOTE — Assessment & Plan Note (Signed)
Patient presents for evaluation of right knee pain. Suspect OA vs patellar femoral syndrome vs patellar tendinitis. -will send for AP/LAT/Sunrise views of bilateral knees to evaluate for OA -dicussed conservative management with ice daily, Aleve twice daily, neoprene brace when exercising

## 2013-11-15 ENCOUNTER — Other Ambulatory Visit: Payer: Self-pay | Admitting: Family Medicine

## 2013-11-19 ENCOUNTER — Encounter: Payer: Self-pay | Admitting: Family Medicine

## 2013-11-21 ENCOUNTER — Telehealth: Payer: Self-pay | Admitting: Family Medicine

## 2013-11-21 MED ORDER — TRAMADOL HCL 50 MG PO TABS: 50.0000 mg | ORAL_TABLET | Freq: Three times a day (TID) | ORAL | Status: DC | PRN

## 2013-11-21 NOTE — Telephone Encounter (Signed)
Message to nursing staff, please call in Tramadol 50 mg, dispense #60, refill #0

## 2013-11-25 ENCOUNTER — Encounter: Payer: Self-pay | Admitting: Family Medicine

## 2013-11-25 ENCOUNTER — Ambulatory Visit (INDEPENDENT_AMBULATORY_CARE_PROVIDER_SITE_OTHER): Payer: 59 | Admitting: Family Medicine

## 2013-11-25 VITALS — BP 125/76 | HR 73 | Ht 65.0 in | Wt 232.0 lb

## 2013-11-25 DIAGNOSIS — M25561 Pain in right knee: Secondary | ICD-10-CM

## 2013-11-25 DIAGNOSIS — M25569 Pain in unspecified knee: Secondary | ICD-10-CM

## 2013-11-25 DIAGNOSIS — I10 Essential (primary) hypertension: Secondary | ICD-10-CM

## 2013-11-25 MED ORDER — METHYLPREDNISOLONE ACETATE 40 MG/ML IJ SUSP
40.0000 mg | Freq: Once | INTRAMUSCULAR | Status: AC
  Administered 2013-11-25: 40 mg via INTRA_ARTICULAR

## 2013-11-25 NOTE — Assessment & Plan Note (Signed)
Patient presents for follow up of knee pain which is manegable but not controlled with conservative therapy.  -Steroid injection provided today -Patient to continue ice/heat/bracing/Aleve/Tramadol

## 2013-11-25 NOTE — Assessment & Plan Note (Signed)
Well controlled continue current regimen.  

## 2013-11-25 NOTE — Patient Instructions (Signed)
Knee Pain - you were given a steroid injection today, please continue Aleve and Tramadol as needed

## 2013-11-25 NOTE — Progress Notes (Signed)
   Subjective:    Patient ID: Sheryl Cooper, female    DOB: 03-19-1961, 53 y.o.   MRN: [id number]  HPI 53 y/o female presents for follow up of multiple medical issues.  HTN - currently on Lisinopril-HCTZ, tolerating well, denies missing doses, no chest pain, no vision changes, no headaches  Bilateral Knee pain (right greater than left) - evaluated earlier this months, thought to be due to OV vs patellofemoral syndrome, patient reports taking 2 Aleve every morning and 1-2 Tramadol per day to control pain, pain is intermittent, worse with exercise, she describes as a burning sensation over the anterior knee, instability present, she is also applying heat and ice intermittently which has provided some relief of the symptoms   Review of Systems  Constitutional: Negative for fever, chills and fatigue.  Respiratory: Negative for cough, choking and shortness of breath.   Cardiovascular: Negative for chest pain.  Musculoskeletal: Positive for arthralgias.       Objective:   Physical Exam Vitals: reviewed Gen: pleasant female, NAD MSK: medial joint line tenderness, no crepitus with flexion/extension, strength 5/5 with flexion and extension, ligament exam deferred as performed at last visit.  Xray right knee - severe medial and patellofemoral OA Xray left knee - mild OA  Procedure Note: Steroid injection of the right knee, risks and benefits discussed with the patient, written consent obtained, injection site identified over the lateral joint space, area cleaned and prepped in a sterile fashion, using a 10 cc syringe 40 mg of Dexamethasone with 3 cc 1% Lidocaine was injected into the joint space, no complications, patient tolerated well, no blood loss     Assessment & Plan:  Please see problem specific assessment and plan.

## 2013-12-19 ENCOUNTER — Other Ambulatory Visit: Payer: Self-pay | Admitting: Family Medicine

## 2014-01-02 ENCOUNTER — Other Ambulatory Visit: Payer: Self-pay | Admitting: *Deleted

## 2014-01-02 ENCOUNTER — Other Ambulatory Visit: Payer: Self-pay | Admitting: Family Medicine

## 2014-01-02 MED ORDER — LISINOPRIL-HYDROCHLOROTHIAZIDE 20-25 MG PO TABS: 1.0000 | ORAL_TABLET | Freq: Every day | ORAL | Status: DC

## 2014-01-03 MED ORDER — TRAMADOL HCL 50 MG PO TABS: 50.0000 mg | ORAL_TABLET | Freq: Three times a day (TID) | ORAL | Status: DC | PRN

## 2014-01-06 ENCOUNTER — Encounter: Payer: Self-pay | Admitting: *Deleted

## 2014-02-03 ENCOUNTER — Other Ambulatory Visit: Payer: Self-pay | Admitting: Family Medicine

## 2014-02-03 DIAGNOSIS — Z803 Family history of malignant neoplasm of breast: Secondary | ICD-10-CM

## 2014-02-13 ENCOUNTER — Other Ambulatory Visit: Payer: Self-pay | Admitting: Family Medicine

## 2014-02-18 ENCOUNTER — Ambulatory Visit (HOSPITAL_COMMUNITY): Payer: 59

## 2014-02-25 ENCOUNTER — Ambulatory Visit (HOSPITAL_COMMUNITY)
Admission: RE | Admit: 2014-02-25 | Discharge: 2014-02-25 | Disposition: A | Discharge status: Home / Self Care | Payer: 59 | Source: Ambulatory Visit | Attending: Family Medicine | Admitting: Family Medicine

## 2014-02-25 DIAGNOSIS — Z803 Family history of malignant neoplasm of breast: Secondary | ICD-10-CM

## 2014-02-25 DIAGNOSIS — Z1231 Encounter for screening mammogram for malignant neoplasm of breast: Secondary | ICD-10-CM | POA: Insufficient documentation

## 2014-02-28 ENCOUNTER — Encounter: Payer: Self-pay | Admitting: Nurse Practitioner

## 2014-02-28 ENCOUNTER — Ambulatory Visit (INDEPENDENT_AMBULATORY_CARE_PROVIDER_SITE_OTHER): Payer: 59 | Admitting: Nurse Practitioner

## 2014-02-28 ENCOUNTER — Other Ambulatory Visit: Payer: Self-pay | Admitting: *Deleted

## 2014-02-28 ENCOUNTER — Other Ambulatory Visit: Payer: Self-pay | Admitting: Nurse Practitioner

## 2014-02-28 VITALS — BP 122/69 | HR 66 | Temp 98.1°F | Ht 65.0 in | Wt 241.7 lb

## 2014-02-28 DIAGNOSIS — N950 Postmenopausal bleeding: Secondary | ICD-10-CM

## 2014-02-28 DIAGNOSIS — D259 Leiomyoma of uterus, unspecified: Secondary | ICD-10-CM

## 2014-02-28 DIAGNOSIS — E669 Obesity, unspecified: Secondary | ICD-10-CM

## 2014-02-28 LAB — CBC
HEMATOCRIT: 37.4 % (ref 36.0–46.0)
HEMOGLOBIN: 12.4 g/dL (ref 12.0–15.0)
MCH: 26.8 pg (ref 26.0–34.0)
MCHC: 33.2 g/dL (ref 30.0–36.0)
MCV: 80.8 fL (ref 78.0–100.0)
Platelets: 343 10*3/uL (ref 150–400)
RBC: 4.63 MIL/uL (ref 3.87–5.11)
RDW: 15.5 % (ref 11.5–15.5)
WBC: 5.5 10*3/uL (ref 4.0–10.5)

## 2014-02-28 LAB — COMPREHENSIVE METABOLIC PANEL
ALT: 12 U/L (ref 0–35)
AST: 34 U/L (ref 0–37)
Albumin: 4.1 g/dL (ref 3.5–5.2)
Alkaline Phosphatase: 47 U/L (ref 39–117)
BUN: 12 mg/dL (ref 6–23)
CALCIUM: 9.2 mg/dL (ref 8.4–10.5)
CHLORIDE: 103 meq/L (ref 96–112)
CO2: 29 meq/L (ref 19–32)
Creat: 0.59 mg/dL (ref 0.50–1.10)
GLUCOSE: 76 mg/dL (ref 70–99)
POTASSIUM: 4.4 meq/L (ref 3.5–5.3)
SODIUM: 139 meq/L (ref 135–145)
TOTAL PROTEIN: 6.7 g/dL (ref 6.0–8.3)
Total Bilirubin: 0.3 mg/dL (ref 0.2–1.2)

## 2014-02-28 LAB — TSH: TSH: 1.023 u[IU]/mL (ref 0.350–4.500)

## 2014-02-28 NOTE — Progress Notes (Signed)
History:  Sheryl Cooper is a 53 y.o. No obstetric history on file. who presents to women's clinic today for post menopausal bleeding. She had been 13 months without a period when she had a period in May, June and July. She is not on any HRT. She had a BTL 22 years ago and has not been sexually active for 10 years. She has had all normal pap smears and her last one was last year. She has a history of fibroid tumor that bled so much she required a D&C in 2011. She had her last mammogram one week ago. Her mother had breast and uterine cancer.   The following portions of the patient's history were reviewed and updated as appropriate: allergies, current medications, past family history, past medical history, past social history, past surgical history and problem list.  Review of Systems:    Objective:  Physical Exam BP 122/69  Pulse 66  Temp(Src) 98.1 F (36.7 C)  Ht 5\' 5"  (1.651 m)  Wt 241 lb 11.2 oz (109.634 kg)  BMI 40.22 kg/m2  LMP 08/17/2012 GENERAL: Well-developed, well-nourished female in no acute distress. Obese HEENT: Normocephalic, atraumatic.  ABDOMEN: Soft, nontender, nondistended. No organomegaly. Normal bowel sounds appreciated in all quadrants.  PELVIC: Normal external female genitalia. Vagina is pink and rugated.  Normal discharge. Normal cervix contour. Uterus is normal in size. No adnexal mass or tenderness. ? Felt fibroid/ difficult to assess due to obesity EXTREMITIES: No cyanosis, clubbing, or edema, 2+ distal pulses.   Labs and Imaging Mm Digital Screening Bilateral  02/26/2014   CLINICAL DATA:  Screening.  EXAM: DIGITAL SCREENING BILATERAL MAMMOGRAM WITH CAD  COMPARISON:  Previous exam(s).  ACR Breast Density Category b: There are scattered areas of fibroglandular density.  FINDINGS: There are no findings suspicious for malignancy. Images were processed with CAD.  IMPRESSION: No mammographic evidence of malignancy. A result letter of this screening mammogram will be  mailed directly to the patient.  RECOMMENDATION: Screening mammogram in one year. (Code:SM-B-01Y)  BI-RADS CATEGORY  1: Negative.   Electronically Signed   By: Shon Hale M.D.   On: 02/26/2014 09:31     Assessment & Plan:  Assessment:  Post menopausal Bleeding  Plans:  Pt was discussed with Dr Roselie Awkward who advised pelvic ultrasound/ scheduled Will get CBC, TSH and CMET, wet prep She will follow up in 2 weeks for possible Endometrial Biopsy   Olegario Messier, NP 02/28/2014 4:59 PM

## 2014-02-28 NOTE — Patient Instructions (Signed)
Postmenopausal Bleeding  Postmenopausal bleeding is any bleeding a woman has after she has entered into menopause. Menopause is the end of a woman's fertile years. After menopause, a woman no longer ovulates or has menstrual periods.   Postmenopausal bleeding can be caused by various things. Any type of postmenopausal bleeding, even if it appears to be a typical menstrual period, is concerning. This should be evaluated by your health care provider. Any treatment will depend on the cause of the bleeding.  HOME CARE INSTRUCTIONS  Monitor your condition for any changes. The following actions may help to alleviate any discomfort you are experiencing:  · Avoid the use of tampons and douches as directed by your health care provider.   · Change your pads frequently.  · Get regular pelvic exams and Pap tests.  · Keep all follow-up appointments for diagnostic tests as directed by your health care provider.  SEEK MEDICAL CARE IF:   · Your bleeding lasts more than 1 week.  · You have abdominal pain.  · You have bleeding with sexual intercourse.  SEEK IMMEDIATE MEDICAL CARE IF:   · You have a fever, chills, headache, dizziness, muscle aches, and bleeding.  · You have severe pain with bleeding.  · You are passing blood clots.  · You have bleeding and need more than 1 pad an hour.  · You feel faint.  MAKE SURE YOU:  · Understand these instructions.  · Will watch your condition.  · Will get help right away if you are not doing well or get worse.  Document Released: 10/26/2005 Document Revised: 05/08/2013 Document Reviewed: 02/14/2013  ExitCare® Patient Information ©2015 [employer]. This information is not intended to replace advice given to you by your health care provider. Make sure you discuss any questions you have with your health care provider.

## 2014-03-02 ENCOUNTER — Ambulatory Visit (INDEPENDENT_AMBULATORY_CARE_PROVIDER_SITE_OTHER): Payer: 59 | Admitting: Emergency Medicine

## 2014-03-02 VITALS — BP 124/72 | HR 64 | Temp 97.9°F | Resp 16 | Ht 65.0 in | Wt 243.4 lb

## 2014-03-02 DIAGNOSIS — T1590XA Foreign body on external eye, part unspecified, unspecified eye, initial encounter: Secondary | ICD-10-CM

## 2014-03-02 DIAGNOSIS — T1591XA Foreign body on external eye, part unspecified, right eye, initial encounter: Secondary | ICD-10-CM

## 2014-03-02 MED ORDER — TOBRAMYCIN 0.3 % OP SOLN: 1.0000 [drp] | OPHTHALMIC | Status: DC

## 2014-03-02 MED ORDER — ROPINIROLE HCL 0.25 MG PO TABS: ORAL_TABLET | ORAL | Status: DC

## 2014-03-02 NOTE — Progress Notes (Signed)
[facility] [address] [place] [address] [phone]  Date:  03/02/2014   Name:  Sheryl Cooper   DOB:  1961/01/29   MRN:  [id number]  PCP:  Lupita Dawn, MD    Chief Complaint: Eye Pain   History of Present Illness:  Sheryl Cooper is a 53 y.o. very pleasant female patient who presents with the following:  Says awakened with a foreign body sensation in right eye from sleep at about 0500.  Has washed her eye with visine.  Persistent FB sensation despite treatment.  No visual symptoms.  No improvement with over the counter medications or other home remedies. Denies other complaint or health concern today.   Patient Active Problem List   Diagnosis Date Noted  . Right knee pain 11/05/2013  . Venous insufficiency 10/25/2013  . Chest pain 07/23/2013  . Restless leg syndrome 05/28/2013  . Insomnia 05/28/2013  . Conjunctivitis 04/08/2013  . Well woman exam 11/20/2012  . Abdominal distension 02/16/2012  . First time seizure 09/27/2010  . HTN (hypertension) 09/24/2010  . OBESITY, UNSPECIFIED 02/24/2010  . PANIC ATTACK 02/24/2010  . MENORRHAGIA, PERIMENOPAUSAL 02/24/2010  . KNEE PAIN, BILATERAL 02/24/2010    Past Medical History  Diagnosis Date  . Hypertension   . Anxiety   . Arthritis   . Abdominal distension   . Chest pain   . Conjunctivitis   . Insomnia   . Obesity, unspecified   . PANIC ATTACK   . Restless leg syndrome     Past Surgical History  Procedure Laterality Date  . Tonsillectomy  1976  . Endometrial ablation    . Colonoscopy      History  Substance Use Topics  . Smoking status: Former Smoker    Quit date: 09/24/1993  . Smokeless tobacco: Never Used  . Alcohol Use: No    Family History  Problem Relation Age of Onset  . Cancer Mother   . Diabetes Mother   . Heart disease Mother   . Hypertension Mother   . Colon cancer Neg Hx   . Esophageal cancer Neg Hx   . Stomach cancer Neg Hx   . Rectal cancer Neg Hx      No Known Allergies  Medication list has been reviewed and updated.  Current Outpatient Prescriptions on File Prior to Visit  Medication Sig Dispense Refill  . ASPIR-LOW 81 MG EC tablet TAKE 1 TABLET (81 MG) BY MOUTH DAILY.  90 tablet  1  . lisinopril-hydrochlorothiazide (PRINZIDE,ZESTORETIC) 20-25 MG per tablet Take 1 tablet by mouth daily.  90 tablet  1  . naproxen sodium (ANAPROX) 220 MG tablet Take 220 mg by mouth 2 (two) times daily with a meal.      . rOPINIRole (REQUIP) 0.25 MG tablet TAKE 3 TABLETS BY MOUTH AT BEDTIME AS NEEDED  90 tablet  1  . sertraline (ZOLOFT) 100 MG tablet TAKE 1 & 1/2 TABLETS BY MOUTH ONCE DAILY  135 tablet  1  . traMADol (ULTRAM) 50 MG tablet TAKE 1 TABLET BY MOUTH EVERY 8 HOURS AS NEEDED FOR PAIN  60 tablet  0   No current facility-administered medications on file prior to visit.    Review of Systems:  As per HPI, otherwise negative.    Physical Examination: Filed Vitals:   03/02/14 0838  BP: 124/72  Pulse: 64  Temp: 97.9 F (36.6 C)  Resp: 16   Filed Vitals:   03/02/14 0838  Height: 5\' 5"  (1.651  m)  Weight: 243 lb 6.4 oz (110.406 kg)   Body mass index is 40.5 kg/(m^2). Ideal Body Weight: Weight in (lb) to have BMI = 25: 149.9   GEN: WDWN, NAD, Non-toxic, Alert & Oriented x 3 HEENT: Atraumatic, Normocephalic.  Ears and Nose: No external deformity. EXTR: No clubbing/cyanosis/edema NEURO: Normal gait.  PSYCH: Normally interactive. Conversant. Not depressed or anxious appearing.  Calm demeanor.  RIGHT eye:  Conjunctival injection.  PRRERLA EOMI  No FB  Fluorescein stain negative  Assessment and Plan: FB right eye Irrigation tobrex EYE referral if recur  Signed,  Ellison Carwin, MD

## 2014-03-02 NOTE — Patient Instructions (Signed)
Eye, Foreign Body The term foreign body refers to any object near, on the surface of or in the eye that should not be there. A foreign body may be a small speck of dirt or dust, a hair or eyelash, a splinter or any object. CAUSES  Foreign bodies can get in the eye by:  Flying pieces of something that was broken or destroyed (debris).  A sudden injury (trauma) to the eye. SYMPTOMS  Symptoms depend on what the foreign body is and where it is in the eye. The most common locations are:  On the inner surface of the upper or lower eyelids or on the covering of the white part of the eye (conjunctiva). Symptoms in this location are:  Irritating and painful, especially when blinking.  Feeling like something is in the eye.  On the surface of the clear covering on the front of the eye (cornea). A corneal foreign body has symptoms that:  Are painful and irritating since the cornea is very sensitive.  Form small "rust rings" around a metallic foreign body. Metallic foreign bodies stick more firmly to the surface of the cornea.  Inside the eyeball. Infection can happen fast and can be hard to treat with antibiotics. This is an extremely dangerous situation. Foreign bodies inside the eye can threaten vision. A person may even loose their eye. Foreign bodies inside the eye may cause:  Great pain.  Immediate loss of vision. DIAGNOSIS  Foreign bodies are found during an exam by an eye specialist. Those that are on the eyelids, conjunctiva or cornea are usually (but not always) easily found. When a foreign body is inside the eyeball, a cataract may form almost right away. This makes it hard for an ophthalmologist to find the foreign body. Special tests may be needed, including ultrasound testing, X-rays and CT scans. TREATMENT   Foreign bodies that are on the eyelids, conjunctiva or cornea are often removed easily and painlessly.  If the foreign body has caused a scratch or abrasion of the cornea,  antibiotic drops, ointments and/or a tight patch called a "pressure patch" may be needed. Follow-up exams will be needed for several days until the abrasion heals.  Surgery is needed right away if the foreign body is inside the eyeball. This is a medical emergency. An antibiotic therapy will likely be given to stop an infection. HOME CARE INSTRUCTIONS  The use of eye patches is not universal. Their use varies from state to state and from caregiver to caregiver. If an eye patch was applied:  Keep the eye patch on for as long as directed by your caregiver until the follow-up appointment.  Do not remove the patch to put in medications unless instructed to do so. When replacing the patch, retape it as it was before. Follow the same procedure if the patch becomes loose.  WARNING: Do not drive or operate machinery while the eye is patched. The ability to judge distances will be impaired.  Only take over-the-counter or prescription medicines for pain, discomfort or fever as directed by the caregiver. If no eye patch was applied:  Keep the eye closed as much as possible. Do not rub the eye.  Wear dark glasses as needed to protect the eyes from bright light.  Do not wear contact lenses until the eye feels normal again, or as instructed.  Wear protective eye covering if there is a risk of eye injury. This is important when working with high speed tools.  Only take over-the-counter or   prescription medicines for pain, discomfort or fever as directed by the caregiver. SEEK IMMEDIATE MEDICAL CARE IF:   Pain increases in the eye or the vision changes.  You or your child has problems with the eye patch.  The injury to the eye appears to be getting larger.  There is discharge from the injured eye.  Swelling and/or soreness (inflammation) develops around the affected eye.  You or your child has an oral temperature above 102 F (38.9 C), not controlled by medicine.  Your baby is older than 3  months with a rectal temperature of 102 F (38.9 C) or higher.  Your baby is 3 months old or younger with a rectal temperature of 100.4 F (38 C) or higher. MAKE SURE YOU:   Understand these instructions.  Will watch your condition.  Will get help right away if you are not doing well or get worse. Document Released: 07/18/2005 Document Revised: 10/10/2011 Document Reviewed: 12/13/2012 ExitCare Patient Information 2015 [employer]. This information is not intended to replace advice given to you by your health care provider. Make sure you discuss any questions you have with your health care provider.  

## 2014-03-04 ENCOUNTER — Encounter: Payer: Self-pay | Admitting: Family Medicine

## 2014-03-04 LAB — WET PREP, GENITAL
Clue Cells Wet Prep HPF POC: NONE SEEN
Trich, Wet Prep: NONE SEEN
WBC, Wet Prep HPF POC: NONE SEEN
Yeast Wet Prep HPF POC: NONE SEEN

## 2014-03-04 NOTE — Progress Notes (Unsigned)
Patient ID: Sheryl Cooper, female   DOB: 28-Dec-1960, 53 y.o.   MRN: [id number] Patient still having significant stress related to home situation, has multiple adult children living at home and grandchild, she reports increasing anxiety, currently on Zoloft, request transition to Wellbutrin (was on previously and thinks that it helped with her anxiety), not interested in CBT at this time, will have patient wean Zoloft (down to 100 mg for 5 days, then down to 50 mg for 5 days, then stop, when she stops will start on Wellbutrin).

## 2014-03-06 ENCOUNTER — Ambulatory Visit (HOSPITAL_COMMUNITY): Payer: 59

## 2014-03-11 ENCOUNTER — Ambulatory Visit (HOSPITAL_COMMUNITY)
Admission: RE | Admit: 2014-03-11 | Discharge: 2014-03-11 | Disposition: A | Discharge status: Home / Self Care | Payer: 59 | Source: Ambulatory Visit | Attending: Nurse Practitioner | Admitting: Nurse Practitioner

## 2014-03-11 DIAGNOSIS — D259 Leiomyoma of uterus, unspecified: Secondary | ICD-10-CM

## 2014-03-11 DIAGNOSIS — N854 Malposition of uterus: Secondary | ICD-10-CM | POA: Insufficient documentation

## 2014-03-11 DIAGNOSIS — N950 Postmenopausal bleeding: Secondary | ICD-10-CM | POA: Insufficient documentation

## 2014-03-11 DIAGNOSIS — D251 Intramural leiomyoma of uterus: Secondary | ICD-10-CM | POA: Insufficient documentation

## 2014-03-12 ENCOUNTER — Other Ambulatory Visit: Payer: Self-pay | Admitting: Family Medicine

## 2014-03-12 MED ORDER — BUPROPION HCL ER (SR) 150 MG PO TB12: ORAL_TABLET | ORAL | Status: DC

## 2014-03-28 ENCOUNTER — Other Ambulatory Visit: Payer: Self-pay | Admitting: Family Medicine

## 2014-03-28 NOTE — Telephone Encounter (Signed)
Note to nursing staff - please call in Tramadol 50 mg Q8 hours PRN pain, dispense #60, no refills

## 2014-04-02 ENCOUNTER — Other Ambulatory Visit: Payer: Self-pay | Admitting: *Deleted

## 2014-04-03 ENCOUNTER — Ambulatory Visit (INDEPENDENT_AMBULATORY_CARE_PROVIDER_SITE_OTHER): Payer: 59 | Admitting: Nurse Practitioner

## 2014-04-03 ENCOUNTER — Encounter: Payer: Self-pay | Admitting: Nurse Practitioner

## 2014-04-03 ENCOUNTER — Other Ambulatory Visit (HOSPITAL_COMMUNITY)
Admission: RE | Admit: 2014-04-03 | Discharge: 2014-04-03 | Disposition: A | Discharge status: Home / Self Care | Payer: 59 | Source: Ambulatory Visit | Attending: Nurse Practitioner | Admitting: Nurse Practitioner

## 2014-04-03 VITALS — BP 105/63 | HR 68 | Temp 97.7°F | Ht 65.0 in | Wt 244.5 lb

## 2014-04-03 DIAGNOSIS — D259 Leiomyoma of uterus, unspecified: Secondary | ICD-10-CM

## 2014-04-03 DIAGNOSIS — N949 Unspecified condition associated with female genital organs and menstrual cycle: Secondary | ICD-10-CM | POA: Insufficient documentation

## 2014-04-03 DIAGNOSIS — E669 Obesity, unspecified: Secondary | ICD-10-CM

## 2014-04-03 DIAGNOSIS — N938 Other specified abnormal uterine and vaginal bleeding: Secondary | ICD-10-CM | POA: Diagnosis not present

## 2014-04-03 DIAGNOSIS — N859 Noninflammatory disorder of uterus, unspecified: Secondary | ICD-10-CM | POA: Diagnosis not present

## 2014-04-03 DIAGNOSIS — N925 Other specified irregular menstruation: Secondary | ICD-10-CM

## 2014-04-03 DIAGNOSIS — N950 Postmenopausal bleeding: Secondary | ICD-10-CM

## 2014-04-03 LAB — POCT PREGNANCY, URINE: Preg Test, Ur: NEGATIVE

## 2014-04-03 NOTE — Progress Notes (Signed)
History:  Sheryl Cooper is a 53 y.o. No obstetric history on file. who presents to [facility] clinic today for endometrial biopsy. She has had an episode of post menopausal bleeding. Her Pelvic Ultrasound was negative but for a 2.4 cm anterior intermural fibroid. Her endometrial thickness was 4 mm. She is currently not sexually active.  She is obese.  The following portions of the patient's history were reviewed and updated as appropriate: allergies, current medications, past family history, past medical history, past social history, past surgical history and problem list.  Review of Systems:    Objective:  Physical Exam BP 105/63  Pulse 68  Temp(Src) 97.7 F (36.5 C)  Ht 5\' 5"  (1.651 m)  Wt 244 lb 8 oz (110.904 kg)  BMI 40.69 kg/m2  LMP 08/17/2012 GENERAL: Well-developed, well-nourished female in no acute distress.  HEENT: Normocephalic, atraumatic.  ABDOMEN: Soft, nontender, nondistended. No organomegaly. Normal bowel sounds appreciated in all quadrants.  PELVIC: Normal external female genitalia. Vagina is pink and rugated.  Normal discharge. Normal cervix contour.  Uterus is normal in size. No adnexal mass or tenderness. Exam difficult due to obesity EXTREMITIES: No cyanosis, clubbing, or edema, 2+ distal pulses.   Labs and Imaging US Transvaginal Non-ob    US Pelvis Complete  03/11/2014   CLINICAL DATA:  Post menstrual bleeding. History of fibroids and endometrial ablation.  EXAM: TRANSABDOMINAL ULTRASOUND OF PELVIS  TECHNIQUE: Transabdominal ultrasound examination of the pelvis was performed including evaluation of the uterus, ovaries, adnexal regions, and pelvic cul-de-sac.  COMPARISON:  02/03/2010  FINDINGS: Uterus  Measurements: Uterus is retroverted, measuring 9.4 x 5.9 x 6.9 cm. Anterior intramural fibroid measures 2.4 x 2.2 x 1.7 cm  Endometrium  Thickness: 4 mm in thickness. Small amount of endometrial fluid noted. No focal abnormality visualized.  Right ovary   Measurements: 2.7 x 2.4 x 1.9 cm. Normal appearance/no adnexal mass.  Left ovary  Measurements: 2.5 x 1.1 x 1.1 cm. Normal appearance/no adnexal mass.  Other findings:  No free fluid  IMPRESSION: 2.4 cm anterior intramural fibroid within the retroverted uterus.  Small amount of fluid in the endometrial canal. No focal endometrial abnormality.   Electronically Signed   By: Rolm Baptise M.D.   On: 03/11/2014 08:58   Endometrial Biopsy  Patient given informed consent, signed copy in the chart, time out was performed. Appropriate time out taken. . The patient was placed in the lithotomy position and the cervix brought into view with sterile speculum.  Portio of cervix cleansed x 2 with betadine swabs.  A tenaculum was placed in the anterior lip of the cervix.  The uterus was sounded for depth of 6 . A pipelle was introduced to into the uterus, suction created,  and an endometrial sample was obtained. All equipment was removed and accounted for.  The patient tolerated the procedure well.    Patient given post procedure instructions. The patient will return in 2 weeks for results. Assessment & Plan:  Assessment: Post Menopausal Bleeding  Plans:  Endometrial Biopsy/ will be called with results   Olegario Messier, NP 04/03/2014 2:35 PM

## 2014-04-03 NOTE — Patient Instructions (Signed)
Endometrial Biopsy Endometrial biopsy is a procedure in which a tissue sample is taken from inside the uterus. The tissue sample is then looked at under a microscope to see if the tissue is normal or abnormal. The endometrium is the lining of the uterus. This procedure helps determine where you are in your menstrual cycle and how hormone levels are affecting the lining of the uterus. This procedure may also be used to evaluate uterine bleeding or to diagnose endometrial cancer, tuberculosis, polyps, or inflammatory conditions.  LET YOUR HEALTH CARE PROVIDER KNOW ABOUT:  Any allergies you have.  All medicines you are taking, including vitamins, herbs, eye drops, creams, and over-the-counter medicines.  Previous problems you or members of your family have had with the use of anesthetics.  Any blood disorders you have.  Previous surgeries you have had.  Medical conditions you have.  Possibility of pregnancy. RISKS AND COMPLICATIONS Generally, this is a safe procedure. However, as with any procedure, complications can occur. Possible complications include:  Bleeding.  Pelvic infection.  Puncture of the uterine wall with the biopsy device (rare). BEFORE THE PROCEDURE   Keep a record of your menstrual cycles as directed by your health care provider. You may need to schedule your procedure for a specific time in your cycle.  You may want to bring a sanitary pad to wear home after the procedure.  Arrange for someone to drive you home after the procedure if you will be given a medicine to help you relax (sedative). PROCEDURE   You may be given a sedative to relax you.  You will lie on an exam table with your feet and legs supported as in a pelvic exam.  Your health care provider will insert an instrument (speculum) into your vagina to see your cervix.  Your cervix will be cleansed with an antiseptic solution. A medicine (local anesthetic) will be used to numb the cervix.  A forceps  instrument (tenaculum) will be used to hold your cervix steady for the biopsy.  A thin, rodlike instrument (uterine sound) will be inserted through your cervix to determine the length of your uterus and the location where the biopsy sample will be removed.  A thin, flexible tube (catheter) will be inserted through your cervix and into the uterus. The catheter is used to collect the biopsy sample from your endometrial tissue.  The catheter and speculum will then be removed, and the tissue sample will be sent to a lab for examination. AFTER THE PROCEDURE  You will rest in a recovery area until you are ready to go home.  You may have mild cramping and a small amount of vaginal bleeding for a few days after the procedure. This is normal.  Make sure you find out how to get your test results. Document Released: 11/18/2004 Document Revised: 03/20/2013 Document Reviewed: 01/02/2013 ExitCare Patient Information 2015 [employer]. This information is not intended to replace advice given to you by your health care provider. Make sure you discuss any questions you have with your health care provider.  

## 2014-04-08 ENCOUNTER — Other Ambulatory Visit: Payer: Self-pay | Admitting: *Deleted

## 2014-04-08 ENCOUNTER — Encounter: Payer: Self-pay | Admitting: General Practice

## 2014-04-08 MED ORDER — TRAMADOL HCL 50 MG PO TABS: 50.0000 mg | ORAL_TABLET | Freq: Three times a day (TID) | ORAL | Status: DC | PRN

## 2014-04-08 NOTE — Telephone Encounter (Signed)
Note to nursing staff - please call in Tramadol 50 mg every 8 hours as needed for pain, dispense #60, refill #0

## 2014-04-08 NOTE — Telephone Encounter (Signed)
Rx called in as written.

## 2014-04-11 ENCOUNTER — Telehealth: Payer: Self-pay | Admitting: General Practice

## 2014-04-11 NOTE — Telephone Encounter (Signed)
Message copied by Shelly Coss on Fri Apr 11, 2014 11:24 AM ------      Message from: BAREFOOT, LINDA M      Created: Thu Apr 10, 2014  8:36 PM       Can you ask one of physicians to review this result and make sure sample was sufficient. If it was call her and let her know it was negative. Thank you. Vaughan Basta ------

## 2014-04-11 NOTE — Telephone Encounter (Signed)
Dr Ihor Dow reviewed endo bx results, stated results were fine and that if patient is still bleeding to start her on megace 40mg  daily. Called patient and informed of her negative endometrial biopsy. Patient verbalized understanding and I asked patient if she was still having bleeding. Patient stated no. Told patient should she start having problems with bleeding again to call and let us know so we can get her some medication to take care of that. Patient verbalized understanding and had no other questions

## 2014-04-30 ENCOUNTER — Other Ambulatory Visit: Payer: Self-pay | Admitting: Family Medicine

## 2014-04-30 MED ORDER — ROPINIROLE HCL 0.25 MG PO TABS: ORAL_TABLET | ORAL | Status: DC

## 2014-04-30 MED ORDER — FLUTICASONE PROPIONATE 50 MCG/ACT NA SUSP: 2.0000 | Freq: Every day | NASAL | Status: DC

## 2014-05-01 NOTE — Telephone Encounter (Signed)
This encounter was created in error - please disregard.

## 2014-05-26 ENCOUNTER — Other Ambulatory Visit: Payer: Self-pay | Admitting: Family Medicine

## 2014-05-26 NOTE — Telephone Encounter (Signed)
Nursing staff - please call in Tramadol 50 mg PO every 8 hours as needed for pain, dispense #60, refill #0

## 2014-05-29 ENCOUNTER — Encounter: Payer: Self-pay | Admitting: Family Medicine

## 2014-06-12 ENCOUNTER — Ambulatory Visit (INDEPENDENT_AMBULATORY_CARE_PROVIDER_SITE_OTHER): Payer: 59 | Admitting: Family Medicine

## 2014-06-12 VITALS — BP 117/45 | HR 79

## 2014-06-12 DIAGNOSIS — F410 Panic disorder [episodic paroxysmal anxiety] without agoraphobia: Secondary | ICD-10-CM

## 2014-06-12 DIAGNOSIS — G4700 Insomnia, unspecified: Secondary | ICD-10-CM

## 2014-06-12 DIAGNOSIS — F4312 Post-traumatic stress disorder, chronic: Secondary | ICD-10-CM

## 2014-06-12 MED ORDER — FLUOXETINE HCL 20 MG PO TABS: 20.0000 mg | ORAL_TABLET | Freq: Every day | ORAL | Status: DC

## 2014-06-13 DIAGNOSIS — F4312 Post-traumatic stress disorder, chronic: Secondary | ICD-10-CM | POA: Insufficient documentation
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
## 2014-06-13 NOTE — Assessment & Plan Note (Signed)
I reviewed the notes from that and counter. Do not think it sounds very consistent with typical seizure activity so I think given the fact that she's been on Wellbutrin without any additional episodes of neurologic change, no atypical seizures etc., I think it safe to continue her on the Wellbutrin and add SSRI I have discussed with Dr. Valentina Lucks and Dr. Ree Kida.

## 2014-06-13 NOTE — Assessment & Plan Note (Deleted)
Hx of syncope, with witnessed seizure-like activity lasting 20 minutes, and possible postictal state on 09/22/10. Evaluated in ED - Normal exam, CT head, CE, CMP, UDS, ETOH, d-dimer, UA. CBC with Hgb 10.6 (stable). EKG with prolonged QT. Marland Kitchen Because episode was possibly a seizure, guidelines state that patient should not drive for 3 months or until seen by neurology

## 2014-06-13 NOTE — Assessment & Plan Note (Signed)
I think she is suffering some residual effects of PTSD. Unclear what the trigger is. I gave her the name of a mental health counselor. She has previously been on Zoloft as monotherapy and was on Wellbutrin as monotherapy, each of which seem to help for a while. I think we will try adding an SSRI to her Wellbutrin and shoes fluoxetine given her insomnia. There is a small risk of idecrease in seizuret hreshold with Wellbutrin and she does have remote history of somewhat atypical seizure but she has been on Wellbutrin now without any issues for some time. I will start her fluoxetine 20, likely she will need to increase to 40. Perhaps we could taper her Wellbutrin back at some point. She has follow-up with her PCP in the next 3-4 weeks. She'll call me in the antrum with any new or worsening symptoms.

## 2014-06-20 ENCOUNTER — Other Ambulatory Visit: Payer: Self-pay | Admitting: Family Medicine

## 2014-06-20 MED ORDER — ROPINIROLE HCL 0.25 MG PO TABS: ORAL_TABLET | ORAL | Status: DC

## 2014-06-30 ENCOUNTER — Other Ambulatory Visit: Payer: Self-pay | Admitting: Family Medicine

## 2014-07-08 ENCOUNTER — Ambulatory Visit (INDEPENDENT_AMBULATORY_CARE_PROVIDER_SITE_OTHER): Payer: 59 | Admitting: Family Medicine

## 2014-07-08 ENCOUNTER — Encounter: Payer: Self-pay | Admitting: Family Medicine

## 2014-07-08 VITALS — BP 122/69 | HR 76 | Temp 98.1°F | Wt 252.0 lb

## 2014-07-08 DIAGNOSIS — F4312 Post-traumatic stress disorder, chronic: Secondary | ICD-10-CM

## 2014-07-08 DIAGNOSIS — E669 Obesity, unspecified: Secondary | ICD-10-CM

## 2014-07-08 DIAGNOSIS — F32A Depression, unspecified: Secondary | ICD-10-CM | POA: Insufficient documentation

## 2014-07-08 DIAGNOSIS — F329 Major depressive disorder, single episode, unspecified: Secondary | ICD-10-CM

## 2014-07-08 DIAGNOSIS — F419 Anxiety disorder, unspecified: Secondary | ICD-10-CM | POA: Insufficient documentation

## 2014-07-08 MED ORDER — PHENTERMINE HCL 30 MG PO CAPS: 30.0000 mg | ORAL_CAPSULE | ORAL | Status: DC

## 2014-07-08 NOTE — Assessment & Plan Note (Signed)
Patient has intermittent depression tied to PTSD. -responding to Wellbutrin and Zoloft -referred to mental health -follow up in one month
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
## 2014-07-08 NOTE — Assessment & Plan Note (Signed)
Patient appears to be responding to combination of Zoloft and Wellbutrin. -no changes to regimen today -encouraged her to make follow up appointment with mental health counselor Ladona Mow

## 2014-07-08 NOTE — Assessment & Plan Note (Signed)
Weight continues to gradually climb. Suspect related to depression and anhedonia towards exercise. Patient would like to attempt trial of Adipex to which she has previously responded well to. -Start Adipex (will only give for 3 months) -Return in one month for weight check and refill

## 2014-07-14 ENCOUNTER — Ambulatory Visit (INDEPENDENT_AMBULATORY_CARE_PROVIDER_SITE_OTHER): Payer: 59 | Admitting: Family Medicine

## 2014-07-14 ENCOUNTER — Encounter: Payer: Self-pay | Admitting: Family Medicine

## 2014-07-14 VITALS — Ht 65.0 in | Wt 249.5 lb

## 2014-07-14 DIAGNOSIS — E669 Obesity, unspecified: Secondary | ICD-10-CM

## 2014-07-14 NOTE — Patient Instructions (Addendum)
-   Call for your therapist appt NOW for a Jan appt.    - Ask Dr. Ree Kida to requisition a ferritin level, which you want to be atleast 50 if you have RLS.    - If low, talk to [facility] re. supplementing.    - Eat at least 3 meals and 1 snack per day.  Aim for no more than 5 hours between eating.  Eat breakfast within one hour of getting up.   - A REAL meal includes protein, starch, and veg's and/or fruit.    - Obtain twice as many veg's as protein or carbohydrate foods for both lunch and dinner.   - Use a luncheon plate most often.     - (There is actually no limit on non-starchy veg's.)   - Physical activity: At least 60 minutes 2 X wk.    - In addition, stand up and walk in place at least a few times on TV commercials.    - Complete your Goals Sheet, and bring to follow-up.

## 2014-07-14 NOTE — Progress Notes (Signed)
Medical Nutrition Therapy:  Appt start time: 1630 end time:  1730.  Assessment:  Primary concerns today: Weight management.  [name] lost from 13 to 217 from Mar 2013 to fall of 2014, following the Atkins diet.  She has regained 35 lb since then.  She started an antidepressant a month ago, and has started to feel better by now.    Learning Readiness: Change in progress; is making efforts to eat a real breakfast and lunch.    Barriers to learning/adherence to lifestyle change: depression has been an issue; also, especially drawn to salty snack foods.    Usual eating pattern includes 3 meals and 0-1 snack per day. Frequent foods and beverages include coffee w/ ff powdered creamer, water, diet soda 1-2 X wk, eggs, homemade soups.  Avoided foods include beets, zucchini, and squash.   Usual physical activity includes:  Just started walking yesterday; hopes to get back to [employer] for stationary bike and elliptical as well as weights.  24-hr recall: (Up at 7:30 AM) B (10:30 AM)-  1/2 grapefruit, 2 boiled eggs, 2 c coffee w/ 2-3 T creamer in each;  Snk ( AM)-    L (3:15 PM)-  2+ c veg soup, water Snk ( PM)-   D (10:30)-  1/2 c Brussels sprouts, diet soda,  Snk ( PM)-   Typical day? No. Ate less than usual yesterday.  Often snacks when home and bored.    Progress Towards Goal(s):  In progress.   Nutritional Diagnosis:  [place]-3.3 Overweight/obesity As related to energy imbalance.  As evidenced by BMI >40.    Intervention:  Nutrition education.  Handouts given during visit include:  AVS  Goals Sheet  Demonstrated degree of understanding via:  Teach Back   Monitoring/Evaluation:  Dietary intake, exercise, and body weight in 3 week(s).

## 2014-07-17 ENCOUNTER — Other Ambulatory Visit: Payer: Self-pay | Admitting: Family Medicine

## 2014-07-17 NOTE — Telephone Encounter (Signed)
[facility]

## 2014-08-06 ENCOUNTER — Other Ambulatory Visit: Payer: Self-pay | Admitting: Family Medicine

## 2014-08-07 ENCOUNTER — Ambulatory Visit: Payer: 59 | Admitting: Family Medicine

## 2014-08-07 ENCOUNTER — Other Ambulatory Visit: Payer: Self-pay | Admitting: Family Medicine

## 2014-08-07 DIAGNOSIS — E669 Obesity, unspecified: Secondary | ICD-10-CM

## 2014-08-07 MED ORDER — PHENTERMINE HCL 30 MG PO CAPS: 30.0000 mg | ORAL_CAPSULE | ORAL | Status: DC

## 2014-08-07 NOTE — Telephone Encounter (Signed)
No available appointments to check weight, patient reports weight down 10 lbs since starting Adipex, will refill for 30 days, patient to be seen asap, will also schedule follow up next month prior to next refill

## 2014-08-08 ENCOUNTER — Other Ambulatory Visit: Payer: Self-pay | Admitting: Family Medicine

## 2014-08-08 MED ORDER — MELOXICAM 7.5 MG PO TABS: 7.5000 mg | ORAL_TABLET | Freq: Every day | ORAL | Status: DC

## 2014-08-11 ENCOUNTER — Encounter: Payer: Self-pay | Admitting: Family Medicine

## 2014-08-11 ENCOUNTER — Ambulatory Visit (INDEPENDENT_AMBULATORY_CARE_PROVIDER_SITE_OTHER): Payer: 59 | Admitting: Family Medicine

## 2014-08-11 VITALS — Ht 65.0 in | Wt 241.9 lb

## 2014-08-11 DIAGNOSIS — E669 Obesity, unspecified: Secondary | ICD-10-CM

## 2014-08-11 NOTE — Progress Notes (Signed)
Medical Nutrition Therapy:  Appt start time: 1630 end time:  1700.  Assessment:  Primary concerns today: Weight management.  Sheryl Cooper has had too much knee pain to walk.  She has been doing calisthenics at home a few times a week.  She is going to check out the fitness ctr at [facility], and will try a stationary bike if they have one.    Sheryl Cooper has been tracking her eating pretty well.  She has been more aware of making better choices, and is drinking a lot of water. She started the Weigh to Wellness class taught by Dr. Ralene Bathe in [facility], which started last week.    Weekends are more challenging for Sheryl Cooper, with less structure than on work days, evident in [employer] and eating times.  We talked about the importance of regularity of eating as well as getting "real meals."  24-hr recall:  (Up at 9:30 AM) B (11 AM)-  6 oz yogurt, 2 T vanilla granola, water Snk ( AM)-  --- L (3:30 PM)-  1 c creamed corn, 1 baked potato, 4 oz hamburger patty, water Snk ( PM)-  --- D (6:30)-  1 pear, 1/2 grapefruit Snk ( PM)-  --- Typical day? Yes.  On work days, usually yogurt and coffee in AM w/ creamer; salad for lunch w/ baked potato & chx.      Progress Towards Goal(s):  In progress.   Nutritional Diagnosis:  [place]-3.3 Overweight/obesity As related to energy imbalance.  As evidenced by BMI >40.    Intervention:  Nutrition education.  Handouts given during visit include:  AVS  Demonstrated degree of understanding via:  Teach Back   Monitoring/Evaluation:  Dietary intake, exercise, and body weight prn.  Will plan to restart MNT appts following the completion of Jackie's 6-wk wt management class (ends 2/10).

## 2014-08-11 NOTE — Patient Instructions (Signed)
-   Goals:  1. Eat veg's at least twice per day, i.e., salad at lunch, and more veg's at dinner.    2. Use iPhone pedometer daily.   3. Go to [employer] gym 30 min 3 X wk.  (Hopes to work up to 1 hour by the end of Feb.)  - Monitoring progress:    - Record food intake & time of eating in a small notebook.    - Record daily steps in same notebook AND tally your steps for each weekly total that goes on Saturday.    - Record minutes of physical activity on your wall calendar, AND tally weekly minutes.    - My recommendation for you:  Make your meal times more consistent, even on weekends.  Plan for meal times, and EAT BREAKFAST within the first hour of being up.    - Also make sure your meals are REAL MEALS:  Include protein, veg's, and some starch.    - Review your food record to see how you're doing with respect to spacing out your meals evenly through the day.

## 2014-08-12 ENCOUNTER — Ambulatory Visit: Payer: 59 | Admitting: Family Medicine

## 2014-08-12 ENCOUNTER — Ambulatory Visit (INDEPENDENT_AMBULATORY_CARE_PROVIDER_SITE_OTHER): Payer: 59 | Admitting: Family Medicine

## 2014-08-12 ENCOUNTER — Ambulatory Visit (HOSPITAL_COMMUNITY)
Admission: RE | Admit: 2014-08-12 | Discharge: 2014-08-12 | Disposition: A | Discharge status: Home / Self Care | Payer: 59 | Source: Ambulatory Visit | Attending: Family Medicine | Admitting: Family Medicine

## 2014-08-12 ENCOUNTER — Encounter: Payer: Self-pay | Admitting: Family Medicine

## 2014-08-12 VITALS — BP 142/66 | HR 75 | Temp 98.0°F | Ht 65.0 in | Wt 242.3 lb

## 2014-08-12 DIAGNOSIS — Z87891 Personal history of nicotine dependence: Secondary | ICD-10-CM | POA: Diagnosis not present

## 2014-08-12 DIAGNOSIS — R0789 Other chest pain: Secondary | ICD-10-CM | POA: Diagnosis not present

## 2014-08-12 DIAGNOSIS — R0781 Pleurodynia: Secondary | ICD-10-CM | POA: Insufficient documentation

## 2014-08-12 MED ORDER — CYCLOBENZAPRINE HCL 5 MG PO TABS: 5.0000 mg | ORAL_TABLET | Freq: Three times a day (TID) | ORAL | Status: DC | PRN

## 2014-08-12 NOTE — Patient Instructions (Signed)
Will get a x-ray of the ribs, this likely will not change management but will let us know if you have a rib fracture. - Use Mobic15 mg daily. - Have called you and a muscle relaxer, use this at night only. - I have prescribed you a rib belt, you can pick this up at a medical supply store. Rib Fracture A rib fracture is a break or crack in one of the bones of the ribs. The ribs are a group of long, curved bones that wrap around your chest and attach to your spine. They protect your lungs and other organs in the chest cavity. A broken or cracked rib is often painful, but most do not cause other problems. Most rib fractures heal on their own over time. However, rib fractures can be more serious if multiple ribs are broken or if broken ribs move out of place and push against other structures. CAUSES   A direct blow to the chest. For example, this could happen during contact sports, a car accident, or a fall against a hard object.  Repetitive movements with high force, such as pitching a baseball or having severe coughing spells. SYMPTOMS   Pain when you breathe in or cough.  Pain when someone presses on the injured area. DIAGNOSIS  Your caregiver will perform a physical exam. Various imaging tests may be ordered to confirm the diagnosis and to look for related injuries. These tests may include a chest X-ray, computed tomography (CT), magnetic resonance imaging (MRI), or a bone scan. TREATMENT  Rib fractures usually heal on their own in 1-3 months. The longer healing period is often associated with a continued cough or other aggravating activities. During the healing period, pain control is very important. Medication is usually given to control pain. Hospitalization or surgery may be needed for more severe injuries, such as those in which multiple ribs are broken or the ribs have moved out of place.  HOME CARE INSTRUCTIONS   Avoid strenuous activity and any activities or movements that cause pain.  Be careful during activities and avoid bumping the injured rib.  Gradually increase activity as directed by your caregiver.  Only take over-the-counter or prescription medications as directed by your caregiver. Do not take other medications without asking your caregiver first.  Apply ice to the injured area for the first 1-2 days after you have been treated or as directed by your caregiver. Applying ice helps to reduce inflammation and pain.  Put ice in a plastic bag.  Place a towel between your skin and the bag.   Leave the ice on for 15-20 minutes at a time, every 2 hours while you are awake.  Perform deep breathing as directed by your caregiver. This will help prevent pneumonia, which is a common complication of a broken rib. Your caregiver may instruct you to:  Take deep breaths several times a day.  Try to cough several times a day, holding a pillow against the injured area.  Use a device called an incentive spirometer to practice deep breathing several times a day.  Drink enough fluids to keep your urine clear or pale yellow. This will help you avoid constipation.   Do not wear a rib belt or binder. These restrict breathing, which can lead to pneumonia.  SEEK IMMEDIATE MEDICAL CARE IF:   You have a fever.   You have difficulty breathing or shortness of breath.   You develop a continual cough, or you cough up thick or bloody sputum.  You feel sick to your stomach (nausea), throw up (vomit), or have abdominal pain.   You have worsening pain not controlled with medications.  MAKE SURE YOU:  Understand these instructions.  Will watch your condition.  Will get help right away if you are not doing well or get worse. Document Released: 07/18/2005 Document Revised: 03/20/2013 Document Reviewed: 09/19/2012 [facility] Patient Information 2015 [place], [place]. This information is not intended to replace advice given to you by your health care provider. Make sure you  discuss any questions you have with your health care provider.

## 2014-08-12 NOTE — Progress Notes (Signed)
   Subjective:    Patient ID: Sheryl Cooper, female    DOB: Jan 31, 1961, 54 y.o.   MRN: [id number]  HPI  Right rib pain: Patient presents to family medicine clinic today for right rib pain that has been intermittent over the last 2 weeks. Patient states that she was rinsing her hair out while she was bending over the bathtub, when she heard a pop and experienced a sharp pain in the right rib location 8-9th rib, on the anterior portion of the rib cage. Pain does not radiate. Patient states that she has taken some Tylenol on occasions, and this hasn't really improved her pain much. Her pain is worse with movement and occasionally when taking deep breaths. It is better if she sitting still. She denies any bruising or erythema, but states it is tender to touch. Patient has had no prior trauma to this area. Her last mammogram was July 2015 and was negative/normal. She has no history of breast cancer, her mother has cancer.   Former smoker  Past Medical History  Diagnosis Date  . Hypertension   . Anxiety   . Arthritis   . Abdominal distension   . Chest pain   . Conjunctivitis   . Insomnia   . Obesity, unspecified   . PANIC ATTACK   . Restless leg syndrome    No Known Allergies   Review of Systems Per HPI    Objective:   Physical Exam BP 142/66 mmHg  Pulse 75  Temp(Src) 98 F (36.7 C) (Oral)  Ht 5\' 5"  (1.651 m)  Wt 242 lb 4.8 oz (109.907 kg)  BMI 40.32 kg/m2  LMP 08/17/2012 Gen: Very pleasant, Caucasian female, no acute distress, nontoxic appearance, well-developed, well-nourished, alert, oriented 3, obese. MSK: No erythema, no soft tissue swelling, no bruising. Tender to palpation over right anterior rib cage eighth to ninth rib. No Masses. Pain with laying flat, and sidebending. Mild pain with bending forward. Body habitus limits exam.     Assessment & Plan:

## 2014-08-12 NOTE — Assessment & Plan Note (Signed)
Rib Pain after trauma. Discussed with patient that x-rays, even if positive likely would not change our management and less she had a large fracture. Patient in understanding. - Patient had been prescribed Mobic 7.5 mg for arthritic pain. Have asked patient to use 15 mg of Mobic a day at this time. - Flexeril as prescribed daily at bedtime, in the event that this is a muscle injury, also allow the patient to be able to get some comfort at night. - Discussed rib belt and its use, wrote a prescription if patient desires to attempt to use a rib belt she can take it to a medical supply store. - Ordered a rib series today, will call patient with results. - Follow up with PCP in 2 weeks

## 2014-08-18 ENCOUNTER — Ambulatory Visit: Payer: 59 | Admitting: Family Medicine

## 2014-08-25 ENCOUNTER — Other Ambulatory Visit: Payer: Self-pay | Admitting: Family Medicine

## 2014-08-26 ENCOUNTER — Other Ambulatory Visit: Payer: Self-pay | Admitting: Family Medicine

## 2014-08-26 MED ORDER — MELOXICAM 15 MG PO TABS: 15.0000 mg | ORAL_TABLET | Freq: Every day | ORAL | Status: DC

## 2014-09-01 ENCOUNTER — Ambulatory Visit (INDEPENDENT_AMBULATORY_CARE_PROVIDER_SITE_OTHER): Payer: 59 | Admitting: Family Medicine

## 2014-09-01 ENCOUNTER — Encounter: Payer: Self-pay | Admitting: Family Medicine

## 2014-09-01 VITALS — BP 122/78 | HR 109 | Temp 98.9°F | Ht 65.0 in | Wt 237.7 lb

## 2014-09-01 DIAGNOSIS — M25569 Pain in unspecified knee: Secondary | ICD-10-CM

## 2014-09-01 DIAGNOSIS — E669 Obesity, unspecified: Secondary | ICD-10-CM

## 2014-09-01 DIAGNOSIS — I10 Essential (primary) hypertension: Secondary | ICD-10-CM

## 2014-09-01 LAB — BASIC METABOLIC PANEL
BUN: 25 mg/dL — ABNORMAL HIGH (ref 6–23)
CO2: 25 meq/L (ref 19–32)
CREATININE: 0.96 mg/dL (ref 0.50–1.10)
Calcium: 9 mg/dL (ref 8.4–10.5)
Chloride: 101 mEq/L (ref 96–112)
Glucose, Bld: 82 mg/dL (ref 70–99)
POTASSIUM: 4.3 meq/L (ref 3.5–5.3)
SODIUM: 135 meq/L (ref 135–145)

## 2014-09-01 MED ORDER — TRAMADOL HCL 50 MG PO TABS: 50.0000 mg | ORAL_TABLET | Freq: Three times a day (TID) | ORAL | Status: DC | PRN

## 2014-09-01 MED ORDER — PHENTERMINE HCL 30 MG PO CAPS: 30.0000 mg | ORAL_CAPSULE | ORAL | Status: DC

## 2014-09-01 NOTE — Assessment & Plan Note (Signed)
Pain is controlled with Mobic 15 mg daily -Discussed with patient the option of steroid injections if pain returns -Encouraged regular exercise

## 2014-09-01 NOTE — Assessment & Plan Note (Signed)
Patient has had 15 pound weight loss in the 2 months since initiation of phentermine -Will prescribe one more month of phentermine -Patient encouraged to continue to attend regular nutrition classes and to exercise regularly

## 2014-09-01 NOTE — Progress Notes (Signed)
   Subjective:    Patient ID: Sheryl Cooper, female    DOB: [birth year], 54 y.o.   MRN: [id number]  HPI 53 year old female presents for routine follow-up.  Hypertension-patient currently on lisinopril-HCTZ 20-25 milligrams daily, no side effects, taking daily, no chest pain, no shortness of breath, no headache, no vision changes  Bilateral knee pain-patient has known osteoarthritis of bilateral knees, patient recently increased mobility to 15 mg daily, she has noted significant improvement in her pain, continues to have pain at night after an bleeding throughout the day, pain is currently 0/10, does report occasional clicking and locking  Obesity-patient currently on phentermine 30 mg daily, she has noticed decreased appetite on this medication, she has had approximately 15 pound weight loss since initiation of therapy 2 months ago, she has not been exercising regularly due to her knee pain, she is going to nutrition counseling and "weight and wellness" weekly  Social-former smoker   Review of Systems  Constitutional: Negative for fever, chills and fatigue.  Respiratory: Negative for cough and shortness of breath.   Cardiovascular: Negative for chest pain and leg swelling.  Gastrointestinal: Negative for nausea, vomiting and diarrhea.       Objective:   Physical Exam Vitals: Reviewed Gen.: Pleasant female, no acute distress MSK: Bilateral knee examination-no swelling present, mild joint line tenderness medially in both joints, mild crepitus with flexion and extension of bilateral knees, ligamental testing deferred at this time Neuro: Strength to knee flexion and extension 5 out of 5 bilaterally  Reviewed imaging from April 2015 of bilateral knees     Assessment & Plan:  Please see problem specific assessment and plan.

## 2014-09-01 NOTE — Patient Instructions (Signed)
Knee pain - continue Mobic  Weight loss - you are doing a great job, keep up the good work, refill of phentermine prescribed  Return in one month

## 2014-09-01 NOTE — Assessment & Plan Note (Signed)
Blood pressure controlled -Check BMP to evaluate renal function as patient is on chronic NSAIDs

## 2014-09-18 ENCOUNTER — Other Ambulatory Visit: Payer: Self-pay | Admitting: Family Medicine

## 2014-09-18 MED ORDER — ROPINIROLE HCL 0.25 MG PO TABS: ORAL_TABLET | ORAL | Status: DC

## 2014-10-28 ENCOUNTER — Other Ambulatory Visit: Payer: Self-pay | Admitting: Family Medicine

## 2014-10-29 NOTE — Telephone Encounter (Signed)
E-filled wellbutrin, called in Tramadol

## 2014-10-30 ENCOUNTER — Other Ambulatory Visit: Payer: Self-pay | Admitting: Family Medicine

## 2014-11-07 ENCOUNTER — Other Ambulatory Visit (INDEPENDENT_AMBULATORY_CARE_PROVIDER_SITE_OTHER): Payer: Self-pay

## 2014-11-07 ENCOUNTER — Encounter: Payer: Self-pay | Admitting: Family Medicine

## 2014-11-07 DIAGNOSIS — M1990 Unspecified osteoarthritis, unspecified site: Secondary | ICD-10-CM

## 2014-11-07 DIAGNOSIS — Z01818 Encounter for other preprocedural examination: Secondary | ICD-10-CM

## 2014-11-07 DIAGNOSIS — I10 Essential (primary) hypertension: Secondary | ICD-10-CM

## 2014-11-07 NOTE — Progress Notes (Signed)
Patient interested in participating in BELT program (Bariatric Exercise and Lifestyle Modification) through [facility]. This program is established for patient who recently underwent bariatric surgery. Sheryl Cooper has not undergone this procedure however is eligible for the program as she is an employee of [employer]. Signed participation form today.  Patient also has upcoming appointment with [facility] to discuss gastric bypass. She is interested in a trial of the BELT program first however would at least like to discuss the procedure with the surgeon. I completed paperwork on previous previous weight loss attempts.   Documents scanned and placed in the scan box.  Dossie Arbour MD

## 2014-12-11 ENCOUNTER — Encounter: Payer: 59 | Attending: General Surgery | Admitting: Dietician

## 2014-12-11 ENCOUNTER — Encounter: Payer: Self-pay | Admitting: Dietician

## 2014-12-11 ENCOUNTER — Other Ambulatory Visit: Payer: Self-pay

## 2014-12-11 ENCOUNTER — Ambulatory Visit (HOSPITAL_COMMUNITY)
Admission: RE | Admit: 2014-12-11 | Discharge: 2014-12-11 | Disposition: A | Discharge status: Home / Self Care | Payer: 59 | Source: Ambulatory Visit | Attending: General Surgery | Admitting: General Surgery

## 2014-12-11 VITALS — Ht 65.0 in | Wt 248.2 lb

## 2014-12-11 DIAGNOSIS — E669 Obesity, unspecified: Secondary | ICD-10-CM

## 2014-12-11 DIAGNOSIS — Z6841 Body Mass Index (BMI) 40.0 and over, adult: Secondary | ICD-10-CM | POA: Insufficient documentation

## 2014-12-11 DIAGNOSIS — Z713 Dietary counseling and surveillance: Secondary | ICD-10-CM | POA: Diagnosis not present

## 2014-12-11 DIAGNOSIS — J984 Other disorders of lung: Secondary | ICD-10-CM | POA: Insufficient documentation

## 2014-12-11 DIAGNOSIS — I10 Essential (primary) hypertension: Secondary | ICD-10-CM | POA: Diagnosis not present

## 2014-12-11 DIAGNOSIS — M479 Spondylosis, unspecified: Secondary | ICD-10-CM | POA: Diagnosis not present

## 2014-12-11 NOTE — Patient Instructions (Signed)

## 2014-12-11 NOTE — Progress Notes (Signed)
  Pre-Op Assessment Visit:  Pre-Operative Sleeve Gastrectomy Surgery  Medical Nutrition Therapy:  Appt start time: 0037 End time:  1500.  Patient was seen on 12/11/2014 for Pre-Operative Nutrition Assessment. Assessment and letter of approval faxed to [facility] Bariatric Surgery Program coordinator on 12/11/2014.   Preferred Learning Style:   No preference indicated   Learning Readiness:   Ready  Handouts given during visit include:  Pre-Op Goals Bariatric Surgery Protein Shakes   During the appointment today the following Pre-Op Goals were reviewed with the patient: Maintain or lose weight as instructed by your surgeon Make healthy food choices Begin to limit portion sizes Limited concentrated sugars and fried foods Keep fat/sugar in the single digits per serving on   food labels Practice CHEWING your food  (aim for 30 chews per bite or until applesauce consistency) Practice not drinking 15 minutes before, during, and 30 minutes after each meal/snack Avoid all carbonated beverages  Avoid/limit caffeinated beverages  Avoid all sugar-sweetened beverages Consume 3 meals per day; eat every 3-5 hours Make a list of non-food related activities Aim for 64-100 ounces of FLUID daily  Aim for at least 60-80 grams of PROTEIN daily Look for a liquid protein source that contain ?15 g protein and ?5 g carbohydrate  (ex: shakes, drinks, shots)  Patient-Centered Goals: She would like to improve her stress, anxiety, depression, arthritis, and blood pressure. She would like to go outside more often and feel better in clothes. She would like to be more comfortable going to the gym. 10 level of confidence/10 level of importance  Demonstrated degree of understanding via:  Teach Back  Teaching Method Utilized:  Visual Auditory Hands on  Barriers to learning/adherence to lifestyle change: none  [facility] to [facility] to enroll in Pre-Op  and Post-Op Nutrition Education when surgery date is scheduled.

## 2014-12-17 ENCOUNTER — Other Ambulatory Visit: Payer: Self-pay | Admitting: Family Medicine

## 2014-12-17 MED ORDER — L-GLUTAMINE POWD: 1.0000 | Freq: Every day | Status: DC

## 2014-12-17 MED ORDER — FOLIC ACID-CHOLECALCIFEROL 1-5750 MG-UNIT PO CAPS: 1.0000 | ORAL_CAPSULE | Freq: Two times a day (BID) | ORAL | Status: DC

## 2014-12-17 NOTE — Telephone Encounter (Signed)
Patient plans to undergo gastric bypass procedure. She requests that I prescribe nutritional supplements. I have agree to do so. Please see prescribed meds.

## 2014-12-18 ENCOUNTER — Other Ambulatory Visit: Payer: Self-pay | Admitting: Family Medicine

## 2014-12-19 ENCOUNTER — Encounter: Payer: Self-pay | Admitting: Family Medicine

## 2014-12-19 ENCOUNTER — Ambulatory Visit (INDEPENDENT_AMBULATORY_CARE_PROVIDER_SITE_OTHER): Payer: 59 | Admitting: Family Medicine

## 2014-12-19 VITALS — BP 137/69 | HR 85 | Temp 98.4°F | Ht 65.0 in | Wt 244.3 lb

## 2014-12-19 DIAGNOSIS — R21 Rash and other nonspecific skin eruption: Secondary | ICD-10-CM

## 2014-12-19 MED ORDER — HYDROCODONE-ACETAMINOPHEN 5-325 MG PO TABS: 1.0000 | ORAL_TABLET | ORAL | Status: DC | PRN

## 2014-12-19 MED ORDER — VALACYCLOVIR HCL 1 G PO TABS: 1000.0000 mg | ORAL_TABLET | Freq: Three times a day (TID) | ORAL | Status: DC

## 2014-12-19 NOTE — Patient Instructions (Addendum)
This MAY be shingles, too early to tell.  Best if we treat as such. Watch for a blistery rash. See the eye doctor immediately if blurred vision, pain with light.  I will give you pain medicine in case it gets worse. Have your daughter check on whether your grandson has received the chicken pox vaccine.

## 2014-12-19 NOTE — Progress Notes (Signed)
   Subjective:    Patient ID: Sheryl Cooper, female    DOB: Jun 01, 1961, 54 y.o.   MRN: [id number]  HPI Awoke today with burning right facial pain in V2 distribution.  Some redness.  No blisters.  Denies blurred vision, photophbia or eye problem.  Rt side of face feels swollen.  Discomfort is out of proportion to redness.  Of course, she had chicken pox as a child.    Review of Systems     Objective:   Physical Exam Vague redness of right cheek.  Eye normal, no injection.  Cornea intact.  No photophobia.         Assessment & Plan:

## 2014-12-19 NOTE — Assessment & Plan Note (Signed)
Unclear because no vesicles, but pain and distribution is very consistent with early shingles.  Will treat as such.

## 2014-12-21 ENCOUNTER — Telehealth: Payer: Self-pay | Admitting: Family Medicine

## 2014-12-21 MED ORDER — ONDANSETRON 4 MG PO TBDP: 4.0000 mg | ORAL_TABLET | Freq: Three times a day (TID) | ORAL | Status: DC | PRN

## 2014-12-21 NOTE — Telephone Encounter (Signed)
Family Medicine After hours phone call  Pt reports having recurrent nausea and vomiting. Had prescription for zofran called [facility] to [facility] but [facility] not fill, and they are now closed on Sunday. Rx for zofran ODT #30 sent in to [facility] in [place].   Tawanna Sat, MD 12/21/2014, 1:46 PM PGY-2, [place]

## 2014-12-30 ENCOUNTER — Ambulatory Visit: Payer: 59 | Admitting: Family Medicine

## 2014-12-30 ENCOUNTER — Encounter: Payer: Self-pay | Admitting: Family Medicine

## 2014-12-30 ENCOUNTER — Ambulatory Visit (INDEPENDENT_AMBULATORY_CARE_PROVIDER_SITE_OTHER): Payer: 59 | Admitting: Family Medicine

## 2014-12-30 VITALS — BP 130/53 | HR 74 | Temp 97.5°F | Ht 65.0 in | Wt 250.0 lb

## 2014-12-30 DIAGNOSIS — F329 Major depressive disorder, single episode, unspecified: Secondary | ICD-10-CM | POA: Diagnosis not present

## 2014-12-30 DIAGNOSIS — E669 Obesity, unspecified: Secondary | ICD-10-CM | POA: Diagnosis not present

## 2014-12-30 DIAGNOSIS — F32A Depression, unspecified: Secondary | ICD-10-CM

## 2014-12-30 DIAGNOSIS — R112 Nausea with vomiting, unspecified: Secondary | ICD-10-CM | POA: Diagnosis not present

## 2014-12-30 LAB — COMPREHENSIVE METABOLIC PANEL
ALT: 25 U/L (ref 0–35)
AST: 37 U/L (ref 0–37)
Albumin: 3.8 g/dL (ref 3.5–5.2)
Alkaline Phosphatase: 62 U/L (ref 39–117)
BUN: 21 mg/dL (ref 6–23)
CALCIUM: 9.2 mg/dL (ref 8.4–10.5)
CHLORIDE: 102 meq/L (ref 96–112)
CO2: 27 mEq/L (ref 19–32)
CREATININE: 0.86 mg/dL (ref 0.50–1.10)
Glucose, Bld: 82 mg/dL (ref 70–99)
Potassium: 4.6 mEq/L (ref 3.5–5.3)
Sodium: 136 mEq/L (ref 135–145)
Total Bilirubin: 0.3 mg/dL (ref 0.2–1.2)
Total Protein: 6.6 g/dL (ref 6.0–8.3)

## 2014-12-30 NOTE — Assessment & Plan Note (Signed)
Patient planning to undergo gastric bypass surgery. Currently undergoing prerequisite testing.

## 2014-12-30 NOTE — Patient Instructions (Signed)
It was nice to see you today.  I am glad to hear that you are feeling better today.  Dr. Ree Kida will call you with your lab results.

## 2014-12-30 NOTE — Progress Notes (Signed)
   Subjective:    Patient ID: Sheryl Cooper, female    DOB: October 23, 1960, 54 y.o.   MRN: [id number]  HPI 54 y/o female presents for acute visit.  Nausea and emesis - intermittent over the past few weeks, initially thought that she had a viral gastroenteritis, nonbloody/nonbilious emesis, improved with prn zofran, able to tolerate breakfast today, has felt better over the past few day, no associated abdominal pain, no dysuria, some mild diarrhea initially that has resolved, no associated sore throat/nasal congestion. Does report a mild cough initially that has resolved  Depression with panic/anxiety - patient reports that symptoms are improved on combination of Wellbutrin 150 BID and Prozac 20 mg daily, does report occasionally missing afternoon dose of Wellbutrin, less panic and anxiety, current stressors include her job and 3 adult children and one grandchild living at home, does coloring for stress relief  Social - 3 grown children and one grandchild live with her; patient planning to undergo gastric bypass surgery   Review of Systems  Constitutional: Negative for fever, chills and fatigue.  Respiratory: Positive for cough. Negative for shortness of breath.   Cardiovascular: Negative for chest pain.  Gastrointestinal: Positive for nausea and diarrhea. Negative for abdominal pain and abdominal distention.  Genitourinary: Negative for dysuria.   See above    Objective:   Physical Exam Vitals: reviewed Gen: pleasant female, NAD Cardiac: RRR, S1 and S2 present, no murmur Resp: CTAB, normal effort Abd: soft, obese, no tenderness, normal bowel sounds Psych: appropriately dressed, affect is slightly flat, mood is stated as mildly depressed, not responding to internal stimuli, no tangential thoughts, no SI, no HI  PHQ 9 score of 2 (see scanned document).     Assessment & Plan:  Please see problem specific assessment and plan.

## 2014-12-30 NOTE — Assessment & Plan Note (Signed)
Intermittent nausea and emesis over the past month. Currently asymptomatic. No clear etiology based on history and exam however suspect resolving viral gastroenteritis.  -Check CMP and CBC.  -continue PRN Zofran

## 2014-12-30 NOTE — Assessment & Plan Note (Signed)
Stable on Prozac and Wellbutrin. PHQ score of 2 -continue current therapy -patient did not go to counseling/psychology however since doing well will not push at this time

## 2014-12-31 LAB — CBC WITH DIFFERENTIAL/PLATELET
Basophils Absolute: 0.1 10*3/uL (ref 0.0–0.1)
Basophils Relative: 1 % (ref 0–1)
EOS ABS: 0.2 10*3/uL (ref 0.0–0.7)
EOS PCT: 2 % (ref 0–5)
HEMATOCRIT: 36.6 % (ref 36.0–46.0)
Hemoglobin: 11.9 g/dL — ABNORMAL LOW (ref 12.0–15.0)
LYMPHS ABS: 2.3 10*3/uL (ref 0.7–4.0)
Lymphocytes Relative: 29 % (ref 12–46)
MCH: 27.7 pg (ref 26.0–34.0)
MCHC: 32.5 g/dL (ref 30.0–36.0)
MCV: 85.3 fL (ref 78.0–100.0)
MONO ABS: 0.5 10*3/uL (ref 0.1–1.0)
MPV: 9.2 fL (ref 8.6–12.4)
Monocytes Relative: 6 % (ref 3–12)
NEUTROS PCT: 62 % (ref 43–77)
Neutro Abs: 5 10*3/uL (ref 1.7–7.7)
Platelets: 472 10*3/uL — ABNORMAL HIGH (ref 150–400)
RBC: 4.29 MIL/uL (ref 3.87–5.11)
RDW: 14.4 % (ref 11.5–15.5)
WBC: 8 10*3/uL (ref 4.0–10.5)

## 2015-01-05 ENCOUNTER — Ambulatory Visit: Payer: 59 | Admitting: Family Medicine

## 2015-01-07 ENCOUNTER — Encounter: Payer: Self-pay | Admitting: Family Medicine

## 2015-01-07 ENCOUNTER — Ambulatory Visit (INDEPENDENT_AMBULATORY_CARE_PROVIDER_SITE_OTHER): Payer: 59 | Admitting: Family Medicine

## 2015-01-07 VITALS — BP 124/72 | HR 81 | Temp 97.9°F | Ht 65.0 in | Wt 250.0 lb

## 2015-01-07 DIAGNOSIS — M549 Dorsalgia, unspecified: Secondary | ICD-10-CM | POA: Insufficient documentation

## 2015-01-07 MED ORDER — METHYLPREDNISOLONE SODIUM SUCC 125 MG IJ SOLR
125.0000 mg | Freq: Once | INTRAMUSCULAR | Status: AC
  Administered 2015-01-07: 125 mg via INTRAMUSCULAR

## 2015-01-07 NOTE — Progress Notes (Signed)
    Subjective:    Patient ID: Sheryl Cooper is a 54 y.o. female presenting with Back Pain  on 01/07/2015  HPI: Reports h/o arthritis in back and is having worsening pain x 2 wks.  Is on Mobic.  No improvement with Ultram or Hydrocodone. Feels like it worse with sitting. Has tried stretching and chair change and some improvement with heating pad. Took to Aleve with no improvement. Does not radiate into legs or upper back or around body. There is no loss of muscle strength or numbness.  Review of Systems  Constitutional: Negative for fever and chills.  Respiratory: Negative for shortness of breath.   Cardiovascular: Negative for chest pain.  Gastrointestinal: Negative for nausea, vomiting and abdominal pain.  Genitourinary: Negative for dysuria.  Skin: Negative for rash.      Objective:    BP 131/42 mmHg  Pulse 81  Temp(Src) 97.9 F (36.6 C) (Oral)  Ht 5\' 5"  (1.651 m)  Wt 250 lb (113.399 kg)  BMI 41.60 kg/m2  LMP 08/17/2012 Physical Exam  Constitutional: She is oriented to person, place, and time. She appears well-developed and well-nourished. No distress.  HENT:  Head: Normocephalic and atraumatic.  Eyes: No scleral icterus.  Neck: Neck supple.  Cardiovascular: Normal rate.   Pulmonary/Chest: Effort normal.  Abdominal: Soft.  Musculoskeletal: She exhibits no edema or tenderness.  There is no paraspinous muscle involvement.  Pain is centrally located over lower thoracic and upper lumbar vertebrae.  Neurological: She is alert and oriented to person, place, and time.  Skin: Skin is warm and dry.  Psychiatric: She has a normal mood and affect.        Assessment & Plan:   Problem List Items Addressed This Visit      Unprioritized   Back pain without radiation - Primary    Given non-radicular nature - will give trial of systemic steroid to see if this gives relief.  Back exercises. Continue meds mobic. Consider addition of Glucosamine.      Relevant Medications     methylPREDNISolone sodium succinate (SOLU-MEDROL) 125 mg/2 mL injection 125 mg (Completed)     Return in about 4 weeks (around 02/04/2015) for a follow-up.   Aricka Goldberger S 01/07/2015 10:23 AM

## 2015-01-07 NOTE — Assessment & Plan Note (Signed)
Given non-radicular nature - will give trial of systemic steroid to see if this gives relief.  Back exercises. Continue meds mobic. Consider addition of Glucosamine.

## 2015-01-07 NOTE — Patient Instructions (Signed)

## 2015-01-10 ENCOUNTER — Telehealth: Payer: 59 | Admitting: Family

## 2015-01-10 DIAGNOSIS — N390 Urinary tract infection, site not specified: Secondary | ICD-10-CM

## 2015-01-10 MED ORDER — SULFAMETHOXAZOLE-TRIMETHOPRIM 800-160 MG PO TABS: 1.0000 | ORAL_TABLET | Freq: Two times a day (BID) | ORAL | Status: DC

## 2015-01-10 NOTE — Progress Notes (Signed)

## 2015-01-23 ENCOUNTER — Telehealth (INDEPENDENT_AMBULATORY_CARE_PROVIDER_SITE_OTHER): Payer: 59 | Admitting: Family Medicine

## 2015-01-23 DIAGNOSIS — G4700 Insomnia, unspecified: Secondary | ICD-10-CM | POA: Diagnosis not present

## 2015-01-23 DIAGNOSIS — E669 Obesity, unspecified: Secondary | ICD-10-CM | POA: Insufficient documentation

## 2015-01-23 DIAGNOSIS — R109 Unspecified abdominal pain: Secondary | ICD-10-CM | POA: Diagnosis not present

## 2015-01-23 DIAGNOSIS — R300 Dysuria: Secondary | ICD-10-CM | POA: Diagnosis not present

## 2015-01-23 DIAGNOSIS — N390 Urinary tract infection, site not specified: Secondary | ICD-10-CM | POA: Diagnosis not present

## 2015-01-23 DIAGNOSIS — F410 Panic disorder [episodic paroxysmal anxiety] without agoraphobia: Secondary | ICD-10-CM | POA: Insufficient documentation

## 2015-01-23 DIAGNOSIS — Z7951 Long term (current) use of inhaled steroids: Secondary | ICD-10-CM | POA: Insufficient documentation

## 2015-01-23 DIAGNOSIS — K5900 Constipation, unspecified: Secondary | ICD-10-CM | POA: Insufficient documentation

## 2015-01-23 DIAGNOSIS — Z87891 Personal history of nicotine dependence: Secondary | ICD-10-CM | POA: Insufficient documentation

## 2015-01-23 DIAGNOSIS — I10 Essential (primary) hypertension: Secondary | ICD-10-CM | POA: Diagnosis not present

## 2015-01-23 DIAGNOSIS — Z79899 Other long term (current) drug therapy: Secondary | ICD-10-CM | POA: Insufficient documentation

## 2015-01-23 DIAGNOSIS — G2581 Restless legs syndrome: Secondary | ICD-10-CM | POA: Insufficient documentation

## 2015-01-23 DIAGNOSIS — M545 Low back pain: Secondary | ICD-10-CM | POA: Insufficient documentation

## 2015-01-23 DIAGNOSIS — M1990 Unspecified osteoarthritis, unspecified site: Secondary | ICD-10-CM | POA: Insufficient documentation

## 2015-01-23 LAB — POCT URINALYSIS DIPSTICK
BILIRUBIN UA: NEGATIVE
Glucose, UA: NEGATIVE
Ketones, UA: NEGATIVE
Nitrite, UA: NEGATIVE
Protein, UA: NEGATIVE
RBC UA: NEGATIVE
Spec Grav, UA: 1.01
UROBILINOGEN UA: 0.2
pH, UA: 6

## 2015-01-23 MED ORDER — CIPROFLOXACIN HCL 500 MG PO TABS
500.0000 mg | ORAL_TABLET | Freq: Two times a day (BID) | ORAL | Status: DC
Start: 2015-01-23 — End: 2015-02-10

## 2015-01-23 NOTE — Telephone Encounter (Signed)
Patient tried to discuss with Dr Ree Kida regarding her urinary symptoms. Dr Ree Kida is currently not available. She mentioned she had a telephone care through [facility] on June 11th and was prescribed an A/B which she was allergic to so she did not complete the course. She continues to have urinary symptoms now associated with flank pain and nausea with no vomiting. She is currently in the clinic but not seen. Advised to give urine to check for infection.

## 2015-01-23 NOTE — Telephone Encounter (Signed)
POC UA with trace leuko. Since she is symptomatic I will treat prophylactically. Urine sent to lab for culture.

## 2015-01-24 ENCOUNTER — Emergency Department (HOSPITAL_BASED_OUTPATIENT_CLINIC_OR_DEPARTMENT_OTHER)
Admission: EM | Admit: 2015-01-24 | Discharge: 2015-01-24 | Disposition: A | Discharge status: Home / Self Care | Payer: 59 | Attending: Emergency Medicine | Admitting: Emergency Medicine

## 2015-01-24 ENCOUNTER — Encounter (HOSPITAL_BASED_OUTPATIENT_CLINIC_OR_DEPARTMENT_OTHER): Payer: Self-pay | Admitting: Emergency Medicine

## 2015-01-24 ENCOUNTER — Emergency Department (HOSPITAL_BASED_OUTPATIENT_CLINIC_OR_DEPARTMENT_OTHER): Payer: 59

## 2015-01-24 DIAGNOSIS — N390 Urinary tract infection, site not specified: Secondary | ICD-10-CM

## 2015-01-24 DIAGNOSIS — M545 Low back pain, unspecified: Secondary | ICD-10-CM

## 2015-01-24 DIAGNOSIS — K5900 Constipation, unspecified: Secondary | ICD-10-CM

## 2015-01-24 LAB — URINALYSIS, ROUTINE W REFLEX MICROSCOPIC
Bilirubin Urine: NEGATIVE
Glucose, UA: NEGATIVE mg/dL
HGB URINE DIPSTICK: NEGATIVE
KETONES UR: NEGATIVE mg/dL
NITRITE: NEGATIVE
Protein, ur: NEGATIVE mg/dL
Specific Gravity, Urine: 1.024 (ref 1.005–1.030)
UROBILINOGEN UA: 0.2 mg/dL (ref 0.0–1.0)
pH: 5.5 (ref 5.0–8.0)

## 2015-01-24 LAB — URINE MICROSCOPIC-ADD ON

## 2015-01-24 MED ORDER — PHENAZOPYRIDINE HCL 200 MG PO TABS: 200.0000 mg | ORAL_TABLET | Freq: Three times a day (TID) | ORAL | Status: DC

## 2015-01-24 MED ORDER — METHOCARBAMOL 500 MG PO TABS: 500.0000 mg | ORAL_TABLET | Freq: Two times a day (BID) | ORAL | Status: DC

## 2015-01-24 MED ORDER — PHENAZOPYRIDINE HCL 100 MG PO TABS
200.0000 mg | ORAL_TABLET | Freq: Once | ORAL | Status: AC
  Administered 2015-01-24: 200 mg via ORAL
  Filled 2015-01-24: qty 2

## 2015-01-24 MED ORDER — NITROFURANTOIN MONOHYD MACRO 100 MG PO CAPS
100.0000 mg | ORAL_CAPSULE | Freq: Once | ORAL | Status: AC
  Administered 2015-01-24: 100 mg via ORAL
  Filled 2015-01-24: qty 1

## 2015-01-24 MED ORDER — DICLOFENAC SODIUM 1 % TD GEL: 4.0000 g | Freq: Four times a day (QID) | TRANSDERMAL | Status: DC

## 2015-01-24 MED ORDER — KETOROLAC TROMETHAMINE 60 MG/2ML IM SOLN
60.0000 mg | Freq: Once | INTRAMUSCULAR | Status: AC
  Administered 2015-01-24: 60 mg via INTRAMUSCULAR
  Filled 2015-01-24: qty 2

## 2015-01-24 MED ORDER — METHOCARBAMOL 500 MG PO TABS
1000.0000 mg | ORAL_TABLET | Freq: Once | ORAL | Status: AC
  Administered 2015-01-24: 1000 mg via ORAL
  Filled 2015-01-24: qty 2

## 2015-01-24 MED ORDER — NITROFURANTOIN MONOHYD MACRO 100 MG PO CAPS: 100.0000 mg | ORAL_CAPSULE | Freq: Two times a day (BID) | ORAL | Status: DC

## 2015-01-24 NOTE — ED Notes (Signed)
Patient transported to X-ray 

## 2015-01-24 NOTE — ED Provider Notes (Signed)
CSN: [id number]     Arrival date & time 01/23/15  2356 History   First MD Initiated Contact with Patient 01/24/15 0150     Chief Complaint  Patient presents with  . Back Pain     (Consider location/radiation/quality/duration/timing/severity/associated sxs/prior Treatment) Patient is a 54 y.o. female presenting with back pain. The history is provided by the patient.  Back Pain Location:  Sacro-iliac joint Quality:  Unable to specify Radiates to:  Does not radiate Pain severity:  Severe Pain is:  Same all the time Onset quality:  Sudden Timing:  Constant Progression:  Worsening Chronicity:  Recurrent Context: not falling and not MCA   Relieved by:  Nothing Worsened by:  Nothing tried Ineffective treatments:  NSAIDs, narcotics and heating pad Associated symptoms: no abdominal pain, no abdominal swelling, no bladder incontinence, no bowel incontinence, no chest pain, no dysuria, no fever, no headaches, no leg pain, no numbness, no paresthesias, no pelvic pain, no perianal numbness, no tingling, no weakness and no weight loss   Associated symptoms comment:  Frequency Risk factors: no hx of cancer   Seen by her PMD and told it was a uti but meds not working  Past Medical History  Diagnosis Date  . Hypertension   . Anxiety   . Arthritis   . Abdominal distension   . Chest pain   . Conjunctivitis   . Insomnia   . Obesity, unspecified   . PANIC ATTACK   . Restless leg syndrome    Past Surgical History  Procedure Laterality Date  . Tonsillectomy  1976  . Endometrial ablation    . Colonoscopy     Family History  Problem Relation Age of Onset  . Cancer Mother   . Diabetes Mother   . Heart disease Mother   . Hypertension Mother   . Colon cancer Neg Hx   . Esophageal cancer Neg Hx   . Stomach cancer Neg Hx   . Rectal cancer Neg Hx    History  Substance Use Topics  . Smoking status: Former Smoker    Quit date: 09/24/1993  . Smokeless tobacco: Never Used  . Alcohol  Use: No   OB History    No data available     Review of Systems  Constitutional: Negative for fever and weight loss.  Cardiovascular: Negative for chest pain.  Gastrointestinal: Negative for abdominal pain and bowel incontinence.  Genitourinary: Negative for bladder incontinence, dysuria, hematuria, flank pain and pelvic pain.  Musculoskeletal: Positive for back pain. Negative for myalgias, joint swelling and arthralgias.  Neurological: Negative for tingling, weakness, numbness, headaches and paresthesias.  All other systems reviewed and are negative.     Allergies  Diflucan  Home Medications   Prior to Admission medications   Medication Sig Start Date End Date Taking? Authorizing Provider  ASPIR-LOW 81 MG EC tablet TAKE 1 TABLET BY MOUTH DAILY. Patient not taking: Reported on 12/11/2014 06/30/14   Lupita Dawn, MD  buPROPion [facility] SR) 150 MG 12 hr tablet TAKE 1 TABLET BY MOUTH DAILY FOR 3 DAYS IF TOLERATED WELL, THEN INCREASE TO 1 TABLET 2 TIMES A DAY 10/29/14   Lupita Dawn, MD  ciprofloxacin (CIPRO) 500 MG tablet Take 1 tablet (500 mg total) by mouth 2 (two) times daily. 01/23/15   Kinnie Feil, MD  diclofenac sodium (VOLTAREN) 1 % GEL Apply 4 g topically 4 (four) times daily. 01/24/15   Marielle Mantione, MD  FLUoxetine (PROZAC) 20 MG tablet Take 1 tablet (20  mg total) by mouth daily. 06/12/14   Dickie La, MD  fluticasone (FLONASE) 50 MCG/ACT nasal spray Place 2 sprays into both nostrils daily. 04/30/14   Lupita Dawn, MD  Folic Acid-Cholecalciferol (REVESTA) [phone] MG-UNIT CAPS Take 1 capsule by mouth 2 (two) times daily. 12/17/14   Lupita Dawn, MD  HYDROcodone-acetaminophen (NORCO/VICODIN) 5-325 MG per tablet Take 1 tablet by mouth every 4 (four) hours as needed for moderate pain. 12/19/14   Zenia Resides, MD  L-Glutamine POWD 1 scoop by Does not apply route daily. 12/17/14   Lupita Dawn, MD  lisinopril-hydrochlorothiazide (PRINZIDE,ZESTORETIC) 20-25 MG per tablet  TAKE 1 TABLET BY MOUTH ONCE DAILY 06/30/14   Lupita Dawn, MD  meloxicam (MOBIC) 15 MG tablet TAKE 1 TABLET BY MOUTH DAILY 10/30/14   Lupita Dawn, MD  methocarbamol (ROBAXIN) 500 MG tablet Take 1 tablet (500 mg total) by mouth 2 (two) times daily. 01/24/15   Marcoantonio Legault, MD  nitrofurantoin, macrocrystal-monohydrate, (MACROBID) 100 MG capsule Take 1 capsule (100 mg total) by mouth 2 (two) times daily. X 7 days 01/24/15   Ayanah Snader, MD  ondansetron (ZOFRAN ODT) 4 MG disintegrating tablet Take 1-2 tablets (4-8 mg total) by mouth every 8 (eight) hours as needed for nausea or vomiting. 12/21/14   Leone Brand, MD  phenazopyridine (PYRIDIUM) 200 MG tablet Take 1 tablet (200 mg total) by mouth 3 (three) times daily. 01/24/15   Keitra Carusone, MD  rOPINIRole (REQUIP) 0.25 MG tablet TAKE 3 TABLETS BY MOUTH AT BEDTIME AS NEEDED 12/18/14   Dickie La, MD  sulfamethoxazole-trimethoprim (BACTRIM DS,SEPTRA DS) 800-160 MG per tablet Take 1 tablet by mouth 2 (two) times daily. 01/10/15   Sharion Balloon, FNP  traMADol (ULTRAM) 50 MG tablet TAKE 1 TABLET BY MOUTH EVERY 8 HOURS AS NEEDED 10/29/14   Lupita Dawn, MD   BP 123/56 mmHg  Pulse 72  Temp(Src) 97.9 F (36.6 C) (Oral)  Resp 16  Ht 5\' 5"  (1.651 m)  Wt 251 lb (113.853 kg)  BMI 41.77 kg/m2  SpO2 95%  LMP 08/17/2012 Physical Exam  Constitutional: She is oriented to person, place, and time. She appears well-developed and well-nourished. No distress.  HENT:  Head: Normocephalic and atraumatic.  Mouth/Throat: Oropharynx is clear and moist.  Eyes: Pupils are equal, round, and reactive to light.  Neck: Normal range of motion. Neck supple.  Cardiovascular: Normal rate, regular rhythm and intact distal pulses.   Pulmonary/Chest: Effort normal and breath sounds normal. No respiratory distress. She has no wheezes. She has no rales.  Abdominal: Soft. Bowel sounds are normal. There is no tenderness. There is no rebound and no guarding.  Musculoskeletal:  Normal range of motion. She exhibits no edema or tenderness.  No step offs or crepitance or point tenderness of the c t or L spine  Neurological: She is alert and oriented to person, place, and time. She has normal reflexes.  Skin: Skin is warm and dry.  Psychiatric: She has a normal mood and affect.    ED Course  Procedures (including critical care time) Labs Review Labs Reviewed  URINALYSIS, ROUTINE W REFLEX MICROSCOPIC (NOT AT [facility]) - Abnormal; Notable for the following:    APPearance CLOUDY (*)    Leukocytes, UA MODERATE (*)    All other components within normal limits  URINE MICROSCOPIC-ADD ON - Abnormal; Notable for the following:    Squamous Epithelial / LPF MANY (*)    Bacteria, UA MANY (*)  All other components within normal limits    Imaging Review Dg Lumbar Spine Complete  01/24/2015   CLINICAL DATA:  Subacute onset of left lower back pain. Initial encounter.  EXAM: LUMBAR SPINE - COMPLETE 4+ VIEW  COMPARISON:  Images from upper GI series performed 12/11/2014  FINDINGS: There is no evidence of fracture or subluxation. Vertebral bodies demonstrate normal height and alignment. Intervertebral disc spaces are preserved. The visualized neural foramina are grossly unremarkable in appearance.  The visualized bowel gas pattern is unremarkable in appearance; air and stool are noted within the colon. The sacroiliac joints are within normal limits.  IMPRESSION: No evidence of fracture or subluxation along the lumbar spine.   Electronically Signed   By: Garald Balding M.D.   On: 01/24/2015 02:27     EKG Interpretation None      MDM   Final diagnoses:  UTI (lower urinary tract infection)  Back pain at L4-L5 level  Constipation, unspecified constipation type    Suspect multifactorial pain.  UTI will switch to macrobid as lower resistance in the community.  Will add pyridium for spasm.  Has narcotics as home will add non sedating muscle relaxant and voltaren gel and refer to  sports medicine.  Also start miralax therapy.  Patient is amenable to this plan and agrees to follow up   Pasha Gadison, MD 01/24/15 [id number]

## 2015-01-24 NOTE — ED Notes (Signed)
Hx of back pain that flared up again 2 weeks ago. Left low back pain. Being treated for UTI  - has taken 1 dose of med.

## 2015-01-24 NOTE — ED Notes (Signed)
MD at bedside. 

## 2015-01-25 LAB — CULTURE, URINE COMPREHENSIVE
Colony Count: NO GROWTH
ORGANISM ID, BACTERIA: NO GROWTH

## 2015-01-30 ENCOUNTER — Encounter: Payer: Self-pay | Admitting: Family Medicine

## 2015-01-30 ENCOUNTER — Other Ambulatory Visit: Payer: Self-pay | Admitting: Family Medicine

## 2015-01-30 MED ORDER — DICLOFENAC SODIUM 1 % TD GEL: 4.0000 g | Freq: Four times a day (QID) | TRANSDERMAL | Status: DC

## 2015-01-30 MED ORDER — TRAMADOL HCL 50 MG PO TABS: 50.0000 mg | ORAL_TABLET | Freq: Three times a day (TID) | ORAL | Status: DC | PRN

## 2015-01-30 NOTE — Telephone Encounter (Signed)
Called in Tramadol and sent Diclofenac gel to pharmacy per patient request in [place].

## 2015-02-06 ENCOUNTER — Other Ambulatory Visit: Payer: Self-pay | Admitting: General Surgery

## 2015-02-06 ENCOUNTER — Telehealth: Payer: 59 | Admitting: Nurse Practitioner

## 2015-02-06 DIAGNOSIS — M546 Pain in thoracic spine: Secondary | ICD-10-CM

## 2015-02-06 NOTE — Progress Notes (Signed)
Based on what you shared with me it looks like you have a serious condition that should be evaluated in a face to face office visit.  * COULD POSSIBLY BE A KIDNEY STONE  If you are having a true medical emergency please call 911.  If you need an urgent face to face visit, [facility] has four urgent care centers for your convenience.  . [facility] a Provider at this Location  [address] [place], [place] [address] . 8 am to 8 pm Monday-Friday . 9 am to 7 pm Saturday-Sunday  . [facility] at [place] a Provider at this Location  [place], [place], [place] [address] . 8 am to 8 pm Monday-Friday . 9 am to 6 pm Saturday . 11 am to 6 pm Sunday   . [facility] at [place] Get Driving Directions  [address], [place] [address] . 8 am to 8 pm Monday-Friday . 9 am to 4 pm Saturday-Sunday   . [facility] (a walk in primary care provider)  [place] a Provider at this Location  [place], [place] [address] . 8 am to 8:30 pm Monday-Thursday . 8 am to 6 pm Friday . 8 am to 4 pm Saturday-Sunday   Your e-visit answers were reviewed by a board certified advanced clinical practitioner to complete your personal care plan.  Depending on the condition, your plan could have included both over the counter or prescription medications.  You will get an e-mail in the next two days asking about your experience.  I hope that your e-visit has been valuable and will speed your recovery . Thank you for choosing an e-visit.

## 2015-02-10 ENCOUNTER — Ambulatory Visit (INDEPENDENT_AMBULATORY_CARE_PROVIDER_SITE_OTHER): Payer: 59 | Admitting: Family Medicine

## 2015-02-10 ENCOUNTER — Ambulatory Visit (HOSPITAL_COMMUNITY)
Admission: RE | Admit: 2015-02-10 | Discharge: 2015-02-10 | Disposition: A | Discharge status: Home / Self Care | Payer: 59 | Source: Ambulatory Visit | Attending: Family Medicine | Admitting: Family Medicine

## 2015-02-10 ENCOUNTER — Encounter: Payer: Self-pay | Admitting: Family Medicine

## 2015-02-10 VITALS — BP 139/66 | HR 80 | Temp 97.6°F | Ht 65.0 in | Wt 249.4 lb

## 2015-02-10 DIAGNOSIS — M545 Low back pain, unspecified: Secondary | ICD-10-CM

## 2015-02-10 DIAGNOSIS — R110 Nausea: Secondary | ICD-10-CM | POA: Insufficient documentation

## 2015-02-10 DIAGNOSIS — M5137 Other intervertebral disc degeneration, lumbosacral region: Secondary | ICD-10-CM | POA: Insufficient documentation

## 2015-02-10 DIAGNOSIS — K753 Granulomatous hepatitis, not elsewhere classified: Secondary | ICD-10-CM | POA: Diagnosis not present

## 2015-02-10 DIAGNOSIS — D739 Disease of spleen, unspecified: Secondary | ICD-10-CM | POA: Diagnosis not present

## 2015-02-10 DIAGNOSIS — K8020 Calculus of gallbladder without cholecystitis without obstruction: Secondary | ICD-10-CM | POA: Diagnosis not present

## 2015-02-10 DIAGNOSIS — M549 Dorsalgia, unspecified: Secondary | ICD-10-CM | POA: Insufficient documentation

## 2015-02-10 DIAGNOSIS — K5730 Diverticulosis of large intestine without perforation or abscess without bleeding: Secondary | ICD-10-CM | POA: Diagnosis not present

## 2015-02-10 LAB — POCT UA - MICROSCOPIC ONLY

## 2015-02-10 LAB — CBC WITH DIFFERENTIAL/PLATELET
BASOS PCT: 1 % (ref 0–1)
Basophils Absolute: 0.1 10*3/uL (ref 0.0–0.1)
EOS PCT: 2 % (ref 0–5)
Eosinophils Absolute: 0.1 10*3/uL (ref 0.0–0.7)
HEMATOCRIT: 34.4 % — AB (ref 36.0–46.0)
Hemoglobin: 11.6 g/dL — ABNORMAL LOW (ref 12.0–15.0)
LYMPHS ABS: 1.9 10*3/uL (ref 0.7–4.0)
Lymphocytes Relative: 29 % (ref 12–46)
MCH: 28.6 pg (ref 26.0–34.0)
MCHC: 33.7 g/dL (ref 30.0–36.0)
MCV: 84.7 fL (ref 78.0–100.0)
MONO ABS: 0.4 10*3/uL (ref 0.1–1.0)
MONOS PCT: 6 % (ref 3–12)
MPV: 9.1 fL (ref 8.6–12.4)
NEUTROS PCT: 62 % (ref 43–77)
Neutro Abs: 4.1 10*3/uL (ref 1.7–7.7)
Platelets: 358 10*3/uL (ref 150–400)
RBC: 4.06 MIL/uL (ref 3.87–5.11)
RDW: 15 % (ref 11.5–15.5)
WBC: 6.6 10*3/uL (ref 4.0–10.5)

## 2015-02-10 LAB — POCT URINALYSIS DIPSTICK
Bilirubin, UA: NEGATIVE
Blood, UA: NEGATIVE
Glucose, UA: NEGATIVE
Ketones, UA: NEGATIVE
NITRITE UA: NEGATIVE
PROTEIN UA: NEGATIVE
Spec Grav, UA: 1.015
Urobilinogen, UA: 0.2
pH, UA: 7.5

## 2015-02-10 LAB — COMPREHENSIVE METABOLIC PANEL
ALBUMIN: 3.8 g/dL (ref 3.5–5.2)
ALT: 12 U/L (ref 0–35)
AST: 22 U/L (ref 0–37)
Alkaline Phosphatase: 78 U/L (ref 39–117)
BILIRUBIN TOTAL: 0.4 mg/dL (ref 0.2–1.2)
BUN: 16 mg/dL (ref 6–23)
CALCIUM: 9.6 mg/dL (ref 8.4–10.5)
CO2: 27 mEq/L (ref 19–32)
Chloride: 103 mEq/L (ref 96–112)
Creat: 0.98 mg/dL (ref 0.50–1.10)
Glucose, Bld: 83 mg/dL (ref 70–99)
POTASSIUM: 4.4 meq/L (ref 3.5–5.3)
SODIUM: 139 meq/L (ref 135–145)
TOTAL PROTEIN: 6.9 g/dL (ref 6.0–8.3)

## 2015-02-10 MED ORDER — KETOROLAC TROMETHAMINE 60 MG/2ML IM SOLN
30.0000 mg | Freq: Once | INTRAMUSCULAR | Status: AC
  Administered 2015-02-10: 30 mg via INTRAMUSCULAR

## 2015-02-10 MED ORDER — ONDANSETRON 4 MG PO TBDP: 4.0000 mg | ORAL_TABLET | Freq: Three times a day (TID) | ORAL | Status: DC | PRN

## 2015-02-10 NOTE — Addendum Note (Signed)
Addended by: [place] Pink on: 02/10/2015 11:49 AM   Modules accepted: Orders

## 2015-02-10 NOTE — Patient Instructions (Signed)
Dr. Ree Kida will call you with your results.  Please pick up Zofran for nausea.

## 2015-02-10 NOTE — Addendum Note (Signed)
Addended by: Junious Dresser on: 02/10/2015 11:23 AM   Modules accepted: Orders

## 2015-02-10 NOTE — Progress Notes (Signed)
   Subjective:    Patient ID: Sheryl Cooper, female    DOB: 1961/05/15, 54 y.o.   MRN: [id number]  HPI 54 year old female presents for evaluation of low back and abdominal pain.  Patient reports 2 week history of intermittent bilateral low back pain and abdominal pain that is primarily in the left lower quadrant. Patient has associated nausea and emesis. Patient having regular bowel movements. No associated dysuria or hematuria.  Patient was evaluated in the emergency room on 01/24/2015. She was treated for presumptive UTI with Macrobid. She also received a Toradol shot and Robaxin. The patient had mild relief of her symptoms. Subsequent urine culture was negative for urinary tract infection. X-rays of the lumbar spine at that time were also unremarkable.  Patient has continued to have symptoms over the past 2 weeks, symptoms will last for minutes to hours at a time, they will resolve spontaneously, she has attempted heating packs as well as her home pain medications including Toradol and Mobic with minimal relief of her symptoms. No associated radicular signs or symptoms, no lateral or bowel incontinence  GYN-patient is postmenopausal, denies current vaginal discharge, denies current sexual activity   Review of Systems  Constitutional: Negative for fever, chills and fatigue.  Respiratory: Negative for choking and shortness of breath.   Gastrointestinal: Positive for nausea and abdominal pain. Negative for diarrhea, constipation and abdominal distention.       Objective:   Physical Exam Vitals: Reviewed Gen.: Pleasant female, appears in mild distress Cardiac: Regular rate and rhythm, S1 and S2 present, no murmurs, no heaves or thrills Respiratory: Clear to patient bilaterally, normal effort Abdomen: Soft, obese, no peritoneal signs, mild suprapubic and left lower quadrant abdominal tenderness, no masses, no CVA tenderness MSK: Bilateral lumbar her spinal tenderness  Reviewed  imaging and lab work from recent emergency room visit     Assessment & Plan:  Please see problem specific assessment and plan.

## 2015-02-10 NOTE — Assessment & Plan Note (Signed)
Patient presents with two-week history of intermittent low back and abdominal pain. Differential at this time includes nephrolithiasis versus diverticulosis versus diverticulitis versus constipation versus MSK etiology. Patient does not currently have GYN-related symptoms however may require additional GYN workup if workup is negative. -CBC and CMP -UA in office today -Zofran when necessary nausea -CT abdomen and pelvis without contrast to evaluate for nephrolithiasis versus diverticulosis -Results are pending at time of discharge and patient will be notified of the results -Toradol 30 mg 1 given in office

## 2015-02-11 ENCOUNTER — Encounter: Payer: Self-pay | Admitting: Family Medicine

## 2015-02-11 ENCOUNTER — Other Ambulatory Visit: Payer: Self-pay | Admitting: Family Medicine

## 2015-02-11 DIAGNOSIS — R1030 Lower abdominal pain, unspecified: Secondary | ICD-10-CM

## 2015-02-11 NOTE — Progress Notes (Signed)
Subjective Patient reports continued lower abdominal pain and low back pain, pain continues to be intermittent in nature, not relieved with movement or tramadol, patient continues to deny urinary symptoms/vaginal discharge/vaginal bleeding. She reports a history of bilateral tubal ligation. She remarks that she still has both her uterus and bilateral ovaries. We discussed the results of her workup including unremarkable CT of the abdomen without contrast as well as negative BMP and CBC. Patient continues to have associated bloating, mild diarrhea, nausea, and decreased appetite.   Objective Repeated abdominal exam which identified obese female, soft abdomen, continued left lower quadrant tenderness, minimal suprapubic tenderness, no rebound, no guarding Patient declined GYN exam today however would be open to exam by female provider  Assessment and plan 59 rolled female with continued left lower quadrant abdominal tenderness as well as low back pain. No clear etiology identified on CT abdomen without contrast. -After discussion with the patient was decided to pursue empiric treatment for diverticulitis with Cipro and Flagyl -I discussed this case with my partner Dr. Gwendlyn Deutscher who recommends pelvic ultrasound. This will be ordered to further evaluate the patient's uterus and ovaries.  Dossie Arbour M.D.

## 2015-02-12 ENCOUNTER — Other Ambulatory Visit: Payer: Self-pay | Admitting: Family Medicine

## 2015-02-12 ENCOUNTER — Ambulatory Visit (HOSPITAL_COMMUNITY)
Admission: RE | Admit: 2015-02-12 | Discharge: 2015-02-12 | Disposition: A | Discharge status: Home / Self Care | Payer: 59 | Source: Ambulatory Visit | Attending: Family Medicine | Admitting: Family Medicine

## 2015-02-12 DIAGNOSIS — D259 Leiomyoma of uterus, unspecified: Secondary | ICD-10-CM | POA: Insufficient documentation

## 2015-02-12 DIAGNOSIS — R1030 Lower abdominal pain, unspecified: Secondary | ICD-10-CM

## 2015-02-12 DIAGNOSIS — R1032 Left lower quadrant pain: Secondary | ICD-10-CM | POA: Diagnosis present

## 2015-02-13 ENCOUNTER — Telehealth: Payer: Self-pay | Admitting: Internal Medicine

## 2015-02-13 ENCOUNTER — Telehealth: Payer: Self-pay | Admitting: Family Medicine

## 2015-02-13 DIAGNOSIS — R1032 Left lower quadrant pain: Secondary | ICD-10-CM

## 2015-02-13 HISTORY — DX: Left lower quadrant pain: R10.32

## 2015-02-13 NOTE — Assessment & Plan Note (Signed)
Patient has recurrent lower quadrant abdominal pain/cramping (left greater than right). Workup thus far including CT abd/pelvis and Transvaginal US negative for acute etiology. Lab work unremarkable. Currently on empiric treatment for possible diverticulosis with Cipro/Flagyl. Provided paper script for Norco and Robaxin.  -as etiology of pain is still unclear will send to GI for further evaluation.

## 2015-02-13 NOTE — Telephone Encounter (Signed)
Spoke with pt and she states she needs to be seen for LLQ abd pain. Pt has had several tests with PCP and would like an OV. Pt scheduled to see Lori Hvozdovic, PA-C 02/17/15@1 :15pm. Pt aware of appt.

## 2015-02-13 NOTE — Telephone Encounter (Signed)
Left message for pt to call back  °

## 2015-02-13 NOTE — Telephone Encounter (Signed)
Spoke to patient about negative [place] results. She still reports intermittent lower quadrant abdominal pain with radiation to back, "describes as like contraction pain". Provided paper script for Norco and Robaxin. Will place referral to GI for further workup.

## 2015-02-17 ENCOUNTER — Other Ambulatory Visit: Payer: 59

## 2015-02-17 ENCOUNTER — Encounter: Payer: Self-pay | Admitting: Physician Assistant

## 2015-02-17 ENCOUNTER — Other Ambulatory Visit (INDEPENDENT_AMBULATORY_CARE_PROVIDER_SITE_OTHER): Payer: 59

## 2015-02-17 ENCOUNTER — Ambulatory Visit (INDEPENDENT_AMBULATORY_CARE_PROVIDER_SITE_OTHER): Payer: 59 | Admitting: Physician Assistant

## 2015-02-17 VITALS — BP 130/80 | HR 72 | Ht 65.0 in | Wt 253.6 lb

## 2015-02-17 DIAGNOSIS — R109 Unspecified abdominal pain: Secondary | ICD-10-CM | POA: Diagnosis not present

## 2015-02-17 DIAGNOSIS — R140 Abdominal distension (gaseous): Secondary | ICD-10-CM | POA: Diagnosis not present

## 2015-02-17 DIAGNOSIS — R1013 Epigastric pain: Secondary | ICD-10-CM

## 2015-02-17 DIAGNOSIS — K8070 Calculus of gallbladder and bile duct without cholecystitis without obstruction: Secondary | ICD-10-CM | POA: Diagnosis not present

## 2015-02-17 LAB — HEPATIC FUNCTION PANEL
ALT: 12 U/L (ref 0–35)
AST: 22 U/L (ref 0–37)
Albumin: 3.7 g/dL (ref 3.5–5.2)
Alkaline Phosphatase: 64 U/L (ref 39–117)
Bilirubin, Direct: 0 mg/dL (ref 0.0–0.3)
Total Bilirubin: 0.2 mg/dL (ref 0.2–1.2)
Total Protein: 6.8 g/dL (ref 6.0–8.3)

## 2015-02-17 LAB — AMYLASE: AMYLASE: 30 U/L (ref 27–131)

## 2015-02-17 LAB — LIPASE: Lipase: 16 U/L (ref 11.0–59.0)

## 2015-02-17 LAB — IGA: IgA: 251 mg/dL (ref 68–378)

## 2015-02-17 LAB — GAMMA GT: GGT: 17 U/L (ref 7–51)

## 2015-02-17 MED ORDER — PANTOPRAZOLE SODIUM 40 MG PO TBEC: DELAYED_RELEASE_TABLET | ORAL | Status: DC

## 2015-02-17 MED ORDER — DICYCLOMINE HCL 10 MG PO CAPS: 10.0000 mg | ORAL_CAPSULE | Freq: Three times a day (TID) | ORAL | Status: DC | PRN

## 2015-02-17 NOTE — Progress Notes (Signed)
Patient ID: Sheryl Cooper, female   DOB: 31-Jul-1961, 54 y.o.   MRN: [id number]     History of Present Illness: Sheryl Cooper is a 54 year old female who is known to Sheryl Cooper. She had a colonoscopy on 07/16/2013, and was found to have a diminutive polyp at the ileocecal valve and polypectomy was performed with a cold snare. She was also noted to have moderate diverticulosis throughout the entire examined colon. She was advised to have a surveillance colonoscopy in 5 years. She she states that about 2 months ago she developed nausea and vomiting that lasted about 3 weeks. He felt better for a week and then developed pain throughout the mid abdomen. She does not feel it is affected by food but she reports that she gets very bloated after she eats. The discomfort that she has radiates around to the mid back and under the right scapula. At times the pain is so severe it is caused her to vomit. She has been belching a lot and has been using toms and Maalox. She has been excessively gassy and has had loose stools. The pain occurs almost daily and typically wakes her up at night with nausea. She has no heartburn but feels she is very bloated and needs to belch a lot. She feels full sooner than normal. She has not had any abnormal weight loss. She has been to the emergency room and was treated for a urinary tract infection. She had films of her spine which were nonrevealing. She had a CT of her abdomen and pelvis that revealed cholelithiasis without cholecystitis. She has had a pelvic ultrasound that was normal. She is not aware of a family history of gallbladder disease. She reports that she gets excess pressure across the upper abdomen when she eats. She has tried eliminating dairy and fried foods with no relief. She also reports that she has been using Aleve, ibuprofen, Excedrin Migraine, and tramadol for the pain. She has no prior history of gastritis or ulcers and has never had an EGD. She reports that she  had an upper GI in May that was normal. She has no dysphagia and denies a globus sensation. Last week her PCP empirically treated her for diverticulitis with 7 days of Cipro and Flagyl and this provided no relief. She has tried drinking a mixture of apple cider vinegar and apple juice with no relief of her symptoms. She has also tried drinking mint tea with no relief of her symptoms. She has had no bright red blood per rectum or melena.   Past Medical History  Diagnosis Date  . Hypertension   . Anxiety   . Arthritis   . Abdominal distension   . Chest pain   . Conjunctivitis   . Insomnia   . Obesity, unspecified   . PANIC ATTACK   . Restless leg syndrome     Past Surgical History  Procedure Laterality Date  . Tonsillectomy  1976  . Endometrial ablation    . Colonoscopy     Family History  Problem Relation Age of Onset  . Cancer Mother   . Diabetes Mother   . Heart disease Mother   . Hypertension Mother   . Colon cancer Neg Hx   . Esophageal cancer Neg Hx   . Stomach cancer Neg Hx   . Rectal cancer Neg Hx    History  Substance Use Topics  . Smoking status: Former Smoker    Quit date: 09/24/1993  . Smokeless tobacco: Never Used  .  Alcohol Use: No   Current Outpatient Prescriptions  Medication Sig Dispense Refill  . ASPIR-LOW 81 MG EC tablet TAKE 1 TABLET BY MOUTH DAILY. 90 tablet 3  . buPROPion (WELLBUTRIN SR) 150 MG 12 hr tablet TAKE 1 TABLET BY MOUTH DAILY FOR 3 DAYS IF TOLERATED WELL, THEN INCREASE TO 1 TABLET 2 TIMES A DAY 60 tablet 5  . diclofenac sodium (VOLTAREN) 1 % GEL Apply 4 g topically 4 (four) times daily. 100 g 0  . FLUoxetine (PROZAC) 20 MG tablet Take 1 tablet (20 mg total) by mouth daily. 90 tablet 3  . fluticasone (FLONASE) 50 MCG/ACT nasal spray Place 2 sprays into both nostrils daily. 16 g 6  . Folic Acid-Cholecalciferol (REVESTA) 08-5748 MG-UNIT CAPS Take 1 capsule by mouth 2 (two) times daily. 60 capsule 11  . HYDROcodone-acetaminophen  (NORCO/VICODIN) 5-325 MG per tablet Take 1 tablet by mouth every 4 (four) hours as needed for moderate pain. 20 tablet 0  . L-Glutamine POWD 1 scoop by Does not apply route daily. 60 g 11  . lisinopril-hydrochlorothiazide (PRINZIDE,ZESTORETIC) 20-25 MG per tablet TAKE 1 TABLET BY MOUTH ONCE DAILY 90 tablet 3  . meloxicam (MOBIC) 15 MG tablet TAKE 1 TABLET BY MOUTH DAILY 90 tablet 1  . methocarbamol (ROBAXIN) 500 MG tablet Take 1 tablet (500 mg total) by mouth 2 (two) times daily. 20 tablet 0  . ondansetron (ZOFRAN ODT) 4 MG disintegrating tablet Take 1 tablet (4 mg total) by mouth every 8 (eight) hours as needed for nausea or vomiting. 20 tablet 0  . phenazopyridine (PYRIDIUM) 200 MG tablet Take 1 tablet (200 mg total) by mouth 3 (three) times daily. 6 tablet 0  . rOPINIRole (REQUIP) 0.25 MG tablet TAKE 3 TABLETS BY MOUTH AT BEDTIME AS NEEDED 90 tablet 2  . traMADol (ULTRAM) 50 MG tablet Take 1 tablet (50 mg total) by mouth every 8 (eight) hours as needed. 60 tablet 1  . dicyclomine (BENTYL) 10 MG capsule Take 1 capsule (10 mg total) by mouth 3 (three) times daily as needed for spasms. 90 capsule 1  . pantoprazole (PROTONIX) 40 MG tablet Take 1 tablet every morning 30 minutes prior to breakfast. 30 tablet 3   No current facility-administered medications for this visit.   Allergies  Allergen Reactions  . Diflucan [Fluconazole] Itching  . Sulfa Antibiotics Hives      Review of Systems: Gen: Denies any fever, chills, sweats, anorexia, fatigue, weakness, malaise, weight loss, and sleep disorder CV: Denies chest pain, angina, palpitations, syncope, orthopnea, PND, peripheral edema, and claudication. Resp: Denies dyspnea at rest, dyspnea with exercise, cough, sputum, wheezing, coughing up blood, and pleurisy. GI: Denies vomiting blood, jaundice, and fecal incontinence.   Denies dysphagia or odynophagia. GU : Denies urinary burning, blood in urine, urinary frequency, urinary hesitancy, nocturnal  urination, and urinary incontinence. MS: Denies joint pain, limitation of movement, and swelling, stiffness, low back pain, extremity pain. Denies muscle weakness, cramps, atrophy.  Derm: Denies rash, itching, dry skin, hives, moles, warts, or unhealing ulcers.  Psych: Denies depression, anxiety, memory loss, suicidal ideation, hallucinations, paranoia, and confusion. Heme: Denies bruising, bleeding, and enlarged lymph nodes. Neuro:  Denies any headaches, dizziness, paresthesia Endo:  Denies any problems with DM, thyroid, adrenal  LAB RESULTS: Comprehensive metabolic panel on 38/93/7342 showed a total bili 0.4, alkaline phosphatase 78, AST 22, ALT 12, total protein 6.9. Albumen 3.8. CBC on 02/10/2015 white count 6.6, hemoglobin 11.6, hematocrit 34.4, platelets 358,000.   Studies:   Ct Abdomen  Pelvis Wo Contrast  02/10/2015   CLINICAL DATA:  Low back pain, nausea  EXAM: CT ABDOMEN AND PELVIS WITHOUT CONTRAST  TECHNIQUE: Multidetector CT imaging of the abdomen and pelvis was performed following the standard protocol without IV contrast.  COMPARISON:  KUB prior to upper GI of 12/11/2014  FINDINGS: The lung bases are clear. The liver is unremarkable in the unenhanced hand state other than several calcified granulomas from prior granulomatous disease. Also calcified splenic granulomas are noted. Multiple gallstones with peripheral calcification fill the gallbladder with the largest stone of 2.4 cm in diameter. No gallbladder wall thickening is seen. There is mottled calcification near the neck of the pancreas but on the coronal images is appears to be just above the pancreas and may be vascular or a calcified node view the pancreas is otherwise normal in size and configuration and the pancreatic duct is not dilated. The adrenal glands are unremarkable and the spleen is normal in size. The stomach is moderately distended with food debris and some contrast with no definite abnormality noted no renal calculi  are seen and there is no evidence of hydronephrosis. Pyelonephritis cannot be evaluated on this unenhanced study. Mild atherosclerotic disease of the abdominal aorta is notable noted without aneurysm.  The uterus is normal in size. No adnexal lesion is seen. No free fluid is noted within the pelvis. The urinary bladder is decompressed and cannot be evaluated. Multiple rectosigmoid colon diverticula are present with mild diverticulosis. Diverticula also are scattered throughout the entire colon. No diverticulitis is seen. The terminal ileum and the appendix are unremarkable degenerative disc disease is present at L5-S1.  IMPRESSION: 1. No explanation for the patient's low back pain is seen. No renal or ureteral calculi are noted and there is no evidence of hydronephrosis. 2. Multiple gallstones filling the gallbladder the largest of 2.4 cm. No CT evidence of acute cholecystitis is seen. 3. Multiple colonic diverticula primarily concentrated within the rectosigmoid colon. No diverticulitis. 4. Degenerative disc disease at L5-S1. 5. Changes of prior granulomatous disease with calcified hepatic and splenic granulomas and a possible calcified node next to the pancreas.   Electronically Signed   By: Ivar Drape M.D.   On: 02/10/2015 14:23   Dg Lumbar Spine Complete  01/24/2015   CLINICAL DATA:  Subacute onset of left lower back pain. Initial encounter.  EXAM: LUMBAR SPINE - COMPLETE 4+ VIEW  COMPARISON:  Images from upper GI series performed 12/11/2014  FINDINGS: There is no evidence of fracture or subluxation. Vertebral bodies demonstrate normal height and alignment. Intervertebral disc spaces are preserved. The visualized neural foramina are grossly unremarkable in appearance.  The visualized bowel gas pattern is unremarkable in appearance; air and stool are noted within the colon. The sacroiliac joints are within normal limits.  IMPRESSION: No evidence of fracture or subluxation along the lumbar spine.    Electronically Signed   By: Garald Balding M.D.   On: 01/24/2015 02:27   US Transvaginal Non-ob  02/12/2015   CLINICAL DATA:  Left lower quadrant pain.  EXAM: TRANSABDOMINAL AND TRANSVAGINAL ULTRASOUND OF PELVIS  TECHNIQUE: Both transabdominal and transvaginal ultrasound examinations of the pelvis were performed. Transabdominal technique was performed for global imaging of the pelvis including uterus, ovaries, adnexal regions, and pelvic cul-de-sac. It was necessary to proceed with endovaginal exam following the transabdominal exam to visualize the endometrium and ovaries.  COMPARISON:  None  FINDINGS: Uterus  Measurements: 7.7 x 4.3 x 5.5 cm. Mild heterogeneous echotexture. 2.1 x 1.7 x  1.5 cm anterior fundal hypoechoic uterine mass most consistent with a uterine fibroid.  Endometrium  Thickness: 3.1 mm.  3 mm cystic structure within the endometrium.  Right ovary  Not visualized.  Left ovary  Not visualized.  Other findings  No free fluid.  The exam is limited secondary to body habitus.  IMPRESSION: 1. Limited evaluation secondary to body habitus. No findings to explain the patient's left lower quadrant pain. 2. Small uterine fibroid.   Electronically Signed   By: Kathreen Devoid   On: 02/12/2015 16:06   US Pelvis Complete  02/12/2015   CLINICAL DATA:  Left lower quadrant pain.  EXAM: TRANSABDOMINAL AND TRANSVAGINAL ULTRASOUND OF PELVIS  TECHNIQUE: Both transabdominal and transvaginal ultrasound examinations of the pelvis were performed. Transabdominal technique was performed for global imaging of the pelvis including uterus, ovaries, adnexal regions, and pelvic cul-de-sac. It was necessary to proceed with endovaginal exam following the transabdominal exam to visualize the endometrium and ovaries.  COMPARISON:  None  FINDINGS: Uterus  Measurements: 7.7 x 4.3 x 5.5 cm. Mild heterogeneous echotexture. 2.1 x 1.7 x 1.5 cm anterior fundal hypoechoic uterine mass most consistent with a uterine fibroid.  Endometrium   Thickness: 3.1 mm.  3 mm cystic structure within the endometrium.  Right ovary  Not visualized.  Left ovary  Not visualized.  Other findings  No free fluid.  The exam is limited secondary to body habitus.  IMPRESSION: 1. Limited evaluation secondary to body habitus. No findings to explain the patient's left lower quadrant pain. 2. Small uterine fibroid.   Electronically Signed   By: Kathreen Devoid   On: 02/12/2015 16:06     Physical Exam: General: Pleasant, well developed female in no acute distress Head: Normocephalic and atraumatic Eyes:  sclerae anicteric, conjunctiva pink  Ears: Normal auditory acuity Lungs: Clear throughout to auscultation Heart: Regular rate and rhythm Abdomen: Soft, non distended, mild right-sided abdominal tenderness with guarding but no rebound, No masses, no hepatomegaly. Normal bowel sounds Musculoskeletal: Symmetrical with no gross deformities  Extremities: No edema  Neurological: Alert oriented x 4, grossly nonfocal Psychological:  Alert and cooperative. Normal mood and affect  Assessment and Recommendations: 54 year old female with a one-month history of upper abdominal pain, dyspepsia, postprandial bloating, found to have cholelithiasis, here for follow-up. Laboratory studies have been normal thus far but patient reports excessive bloating and discomfort in the upper abdomen radiating to the back after meals with nausea that awakens her at night and is accompanied by similar pain. Will obtain a HIDA scan to evaluate for gallbladder function. Will repeat hepatic function with amylase and lipase and GGT. Patient would like IgA and TTG checked as well to evaluate for possible celiac. In the meantime, and antireflux regimen has been reviewed and she will be given a trial of pantoprazole 40 mg 1 by mouth every morning 30 minutes prior to breakfast. We will also try Bentyl 10 mg 1 by mouth 3 times a day when necessary cramps/spasm. She will return in 3 weeks, sooner if  needed. If above testing is nonrevealing, she will be scheduled for an EGD.        Rian Koon, Deloris Ping 02/17/2015,

## 2015-02-17 NOTE — Progress Notes (Signed)
I think she may have symptomatic cholelithiasis and should have a surgical evaluation. Discussed with GI physician assistant who will follow up with patient post HIDA

## 2015-02-17 NOTE — Patient Instructions (Signed)
We have sent medications to your pharmacy for you to pick up at your convenience.  Your physician has requested that you go to the basement for lab work before leaving today.  You have been scheduled for a HIDA scan at [facility] on 03/03/2015. Please arrive 15 minutes prior to your scheduled appointment at  1:69IH. Make certain not to have anything to eat or drink at least 6 hours prior to your test. Should this appointment date or time not work well for you, please call radiology scheduling at [phone].  _____________________________________________________________________ hepatobiliary (HIDA) scan is an imaging procedure used to diagnose problems in the liver, gallbladder and bile ducts. In the HIDA scan, a radioactive chemical or tracer is injected into a vein in your arm. The tracer is handled by the liver like bile. Bile is a fluid produced and excreted by your liver that helps your digestive system break down fats in the foods you eat. Bile is stored in your gallbladder and the gallbladder releases the bile when you eat a meal. A special nuclear medicine scanner (gamma camera) tracks the flow of the tracer from your liver into your gallbladder and small intestine.  During your HIDA scan  You'll be asked to change into a hospital gown before your HIDA scan begins. Your health care team will position you on a table, usually on your back. The radioactive tracer is then injected into a vein in your arm.The tracer travels through your bloodstream to your liver, where it's taken up by the bile-producing cells. The radioactive tracer travels with the bile from your liver into your gallbladder and through your bile ducts to your small intestine.You may feel some pressure while the radioactive tracer is injected into your vein. As you lie on the table, a special gamma camera is positioned over your abdomen taking pictures of the tracer as it moves through your body. The gamma camera takes pictures  continually for about an hour. You'll need to keep still during the HIDA scan. This can become uncomfortable, but you may find that you can lessen the discomfort by taking deep breaths and thinking about other things. Tell your health care team if you're uncomfortable. The radiologist will watch on a computer the progress of the radioactive tracer through your body. The HIDA scan may be stopped when the radioactive tracer is seen in the gallbladder and enters your small intestine. This typically takes about an hour. In some cases extra imaging will be performed if original images aren't satisfactory, if morphine is given to help visualize the gallbladder or if the medication CCK is given to look at the contraction of the gallbladder. This test typically takes 2 hours to complete. ________________________________________________________________________

## 2015-02-23 ENCOUNTER — Encounter: Payer: 59 | Attending: General Surgery

## 2015-02-23 VITALS — Ht 65.0 in | Wt 252.0 lb

## 2015-02-23 DIAGNOSIS — E669 Obesity, unspecified: Secondary | ICD-10-CM | POA: Diagnosis present

## 2015-02-23 DIAGNOSIS — Z713 Dietary counseling and surveillance: Secondary | ICD-10-CM | POA: Diagnosis not present

## 2015-02-23 DIAGNOSIS — Z6841 Body Mass Index (BMI) 40.0 and over, adult: Secondary | ICD-10-CM | POA: Insufficient documentation

## 2015-02-23 NOTE — Progress Notes (Signed)
  Pre-Operative Nutrition Class:  Appt start time: 2902   End time:  1830.  Patient was seen on 02/23/15 for Pre-Operative Bariatric Surgery Education at [facility].   Surgery date:  Surgery type: Sleeve Gastrectomy Start weight at [facility]: 248 lbs on 12/11/14 Weight today: 252 lbs  TANITA  BODY COMP RESULTS  02/23/15   BMI (kg/m^2) 41.9   Fat Mass (lbs) 120   Fat Free Mass (lbs) 132   Total Body Water (lbs) 96.5   Samples given per MNT protocol. Patient educated on appropriate usage: Premier protein shake (chocolate - qty 1) Lot #: [id number] Exp: 10/2015  Celebrate Calcium Citrate chew (berry - qty 1) Lot #: [id number] Exp: 09/2016  Renee Pain Protein Powder (vanilla - qty 1) Lot #: [id number] Exp: 01/2016  The following the learning objectives were met by the patient during this course:  Identify Pre-Op Dietary Goals and will begin 2 weeks pre-operatively  Identify appropriate sources of fluids and proteins   State protein recommendations and appropriate sources pre and post-operatively  Identify Post-Operative Dietary Goals and will follow for 2 weeks post-operatively  Identify appropriate multivitamin and calcium sources  Describe the need for physical activity post-operatively and will follow MD recommendations  State when to call healthcare provider regarding medication questions or post-operative complications  Handouts given during class include:  Pre-Op Bariatric Surgery Diet Handout  Protein Shake Handout  Post-Op Bariatric Surgery Nutrition Handout  BELT Program Information Flyer  Support Group Information Flyer  [facility] Bariatric Supplements Price List  Follow-Up Plan: Patient will follow-up at [facility] 2 weeks post operatively for diet advancement per MD.

## 2015-02-25 ENCOUNTER — Telehealth: Payer: Self-pay | Admitting: Family Medicine

## 2015-02-25 DIAGNOSIS — E669 Obesity, unspecified: Secondary | ICD-10-CM

## 2015-02-25 MED ORDER — PHENTERMINE HCL 37.5 MG PO TABS: 37.5000 mg | ORAL_TABLET | Freq: Every day | ORAL | Status: DC

## 2015-02-25 NOTE — Assessment & Plan Note (Signed)
Patient currently undergoing preparation for bariatric surgery. However, is rethinking her decision. We discussed in detail multiple possible weight loss mediations. Patient also planning to restart Atkins. She has decided to postpone bariatric surgery. Will start Phentermine (considered Qsymia however patient on Wellbutrin and combination with Topiramate may cause seizures). Patient to follow up in a few weeks for weight check.

## 2015-02-25 NOTE — Telephone Encounter (Addendum)
Patient currently undergoing preparation for bariatric surgery. However, is rethinking her decision. We discussed in detail multiple possible weight loss mediations. Patient also planning to restart Atkins. She has decided to postpone bariatric surgery. Will start Phentermine. Patient to follow up in a few weeks.

## 2015-03-03 ENCOUNTER — Encounter (HOSPITAL_COMMUNITY): Admission: RE | Admit: 2015-03-03 | Payer: 59 | Source: Ambulatory Visit

## 2015-03-10 ENCOUNTER — Ambulatory Visit: Payer: 59 | Admitting: Physician Assistant

## 2015-03-14 ENCOUNTER — Telehealth: Payer: 59 | Admitting: Family

## 2015-03-14 DIAGNOSIS — M545 Low back pain: Secondary | ICD-10-CM

## 2015-03-14 MED ORDER — ETODOLAC 300 MG PO CAPS: 300.0000 mg | ORAL_CAPSULE | Freq: Two times a day (BID) | ORAL | Status: DC

## 2015-03-14 MED ORDER — CYCLOBENZAPRINE HCL 10 MG PO TABS: 10.0000 mg | ORAL_TABLET | Freq: Three times a day (TID) | ORAL | Status: DC | PRN

## 2015-03-14 NOTE — Progress Notes (Signed)
We are sorry that you are not feeling well.  Here is how we plan to help!  Based on what you have shared with me it looks like you mostly have acute back pain.  Acute back pain is defined as musculoskeletal pain that can resolve in 1-3 weeks with conservative treatment.  I have prescribed Etodolac 300 mg twice a day non-steroid anti-inflammatory (NSAID) as well as Flexeril 10 mg every eight hours as needed which is a muscle relaxer.  Some patients experience stomach irritation or in increased heartburn with anti-inflammatory drugs.  Please keep in mind that muscle relaxer's can cause fatigue and should not be taken while at work or driving.  Back pain is very common.  The pain often gets better over time.  The cause of back pain is usually not dangerous.  Most people can learn to manage their back pain on their own.  Home Care  Stay active.  Start with short walks on flat ground if you can.  Try to walk farther each day.  Do not sit, drive or stand in one place for more than 30 minutes.  Do not stay in bed.  Do not avoid exercise or work.  Activity can help your back heal faster.  Be careful when you bend or lift an object.  Bend at your knees, keep the object close to you, and do not twist.  Sleep on a firm mattress.  Lie on your side, and bend your knees.  If you lie on your back, put a pillow under your knees.  Only take medicines as told by your doctor.  Put ice on the injured area.  Put ice in a plastic bag  Place a towel between your skin and the bag  Leave the ice on for 15-20 minutes, 3-4 times a day for the first 2-3 days.  After that, you can switch between ice and heat packs.  Ask your doctor about back exercises or massage.  Avoid feeling anxious or stressed.  Find good ways to deal with stress, such as exercise.  Get Help Right Way If:  Your pain does not go away with rest or medicine.  Your pain does not go away in 1 week.  You have new problems.  You do not  feel well.  The pain spreads into your legs.  You cannot control when you poop (bowel movement) or pee (urinate)  You feel sick to your stomach (nauseous) or throw up (vomit)  You have belly (abdominal) pain.  You feel like you may pass out (faint).  If you develop a fever.  Make Sure you:  Understand these instructions.  Will watch your condition  Will get help right away if you are not doing well or get worse.  Your e-visit answers were reviewed by a board certified advanced clinical practitioner to complete your personal care plan.  Depending on the condition, your plan could have included both over the counter or prescription medications.  If there is a problem please reply  once you have received a response from your provider.  Your safety is important to [place].  If you have drug allergies check your prescription carefully.    You can use MyChart to ask questions about today's visit, request a non-urgent call back, or ask for a work or school excuse.  You will get an e-mail in the next two days asking about your experience.  I hope that your e-visit has been valuable and will speed your recovery. Thank you  for using e-visits.

## 2015-03-18 ENCOUNTER — Other Ambulatory Visit: Payer: Self-pay | Admitting: Family Medicine

## 2015-03-18 MED ORDER — TRAMADOL HCL 50 MG PO TABS: 50.0000 mg | ORAL_TABLET | Freq: Three times a day (TID) | ORAL | Status: DC | PRN

## 2015-03-20 ENCOUNTER — Inpatient Hospital Stay (HOSPITAL_COMMUNITY): Admission: RE | Admit: 2015-03-20 | Payer: 59 | Source: Ambulatory Visit

## 2015-03-20 ENCOUNTER — Other Ambulatory Visit: Payer: Self-pay | Admitting: Family Medicine

## 2015-03-24 ENCOUNTER — Encounter (HOSPITAL_COMMUNITY): Admission: RE | Payer: Self-pay | Source: Ambulatory Visit

## 2015-03-24 SURGERY — GASTRECTOMY, SLEEVE, LAPAROSCOPIC
Anesthesia: General

## 2015-03-26 ENCOUNTER — Other Ambulatory Visit: Payer: Self-pay | Admitting: Family Medicine

## 2015-03-26 DIAGNOSIS — M545 Low back pain: Secondary | ICD-10-CM

## 2015-03-26 NOTE — Telephone Encounter (Signed)
Needs refill on cyclobenzapar, Was prescribed thru my chart

## 2015-03-27 MED ORDER — CYCLOBENZAPRINE HCL 10 MG PO TABS
10.0000 mg | ORAL_TABLET | Freq: Three times a day (TID) | ORAL | Status: DC | PRN
Start: 2015-03-27 — End: 2015-10-30

## 2015-03-31 ENCOUNTER — Ambulatory Visit: Payer: 59

## 2015-04-02 ENCOUNTER — Inpatient Hospital Stay (HOSPITAL_COMMUNITY): Admission: RE | Admit: 2015-04-02 | Payer: 59 | Source: Ambulatory Visit | Admitting: General Surgery

## 2015-04-08 ENCOUNTER — Encounter: Payer: Self-pay | Admitting: Family Medicine

## 2015-04-08 NOTE — Progress Notes (Signed)
Entered patient reported weight at initiation of Adipex (03/02/15)

## 2015-04-09 ENCOUNTER — Ambulatory Visit (INDEPENDENT_AMBULATORY_CARE_PROVIDER_SITE_OTHER): Payer: 59 | Admitting: Family Medicine

## 2015-04-09 ENCOUNTER — Encounter: Payer: Self-pay | Admitting: Family Medicine

## 2015-04-09 VITALS — BP 130/63 | HR 88 | Temp 98.3°F | Ht 65.0 in | Wt 244.0 lb

## 2015-04-09 DIAGNOSIS — E669 Obesity, unspecified: Secondary | ICD-10-CM

## 2015-04-09 DIAGNOSIS — Z23 Encounter for immunization: Secondary | ICD-10-CM

## 2015-04-09 DIAGNOSIS — R1032 Left lower quadrant pain: Secondary | ICD-10-CM | POA: Diagnosis not present

## 2015-04-09 DIAGNOSIS — F329 Major depressive disorder, single episode, unspecified: Secondary | ICD-10-CM

## 2015-04-09 DIAGNOSIS — F32A Depression, unspecified: Secondary | ICD-10-CM

## 2015-04-09 MED ORDER — PHENTERMINE HCL 37.5 MG PO TABS: 37.5000 mg | ORAL_TABLET | Freq: Every day | ORAL | Status: DC

## 2015-04-09 NOTE — Assessment & Plan Note (Signed)
Resolved since stopping mild products -encouraged calcium supplementation -continue to hold mild products

## 2015-04-09 NOTE — Progress Notes (Signed)
   Subjective:    Patient ID: Sheryl Cooper, female    DOB: 1961-04-26, 54 y.o.   MRN: [id number]  HPI 54 y/o female presents for follow up of obesity and depression.  Obesity - taking Adipex for one month, reports decreased appetite, eating 3-4 small meals per day, has not been exercising  Depression - PHQ score of 9 (see scanned document), reports stress at home, daughter and grandson did recently move out, however 2 sons still living at home, able to fall asleep with soft music, has stopped taking otc medications for insomnia  Abdominal pain - has resolved since stopping mild, still has intermittent back pain which she thinks is related to arthritis and obesity, controlled with Mobic  Social - applying for a transfer to [employer], moving into new house in October.    Review of Systems  Constitutional: Negative for fever, chills and fatigue.  Respiratory: Negative for cough and shortness of breath.   Cardiovascular: Negative for chest pain and leg swelling.  Gastrointestinal: Negative for abdominal pain, diarrhea, constipation and abdominal distention.       Objective:   Physical Exam Vital: reviewd (lost 8 pounds since last visit) Gen: pleasant female, NAD Cardiac: RRR, S1 and S2 present, no murmur, no heaves/thrills Resp: CTAB, normal effort Psych: flat affect, mood is depressed, no tangential thoughts, no SI, no HI     Assessment & Plan:  Please see problem specific assessment and plan.

## 2015-04-09 NOTE — Assessment & Plan Note (Signed)
Patient has had 8 pound weight loss since last visit -continue Adipex -encouraged regular exercise -F/U one month

## 2015-04-09 NOTE — Assessment & Plan Note (Signed)
PHQ score of 9.  -continue Prozac and Wellbutrin -suspect mood will improve with move to new house and regular exercise -monitor at subsequent visits.

## 2015-04-09 NOTE — Patient Instructions (Signed)
Congratulations on losing 8 pounds.  Begin to exercise in the morning for at least 30 minutes (3-4 times per week).  Continue Adipex.  Continue Prozac and Wellbutrin.  Return in one month for weight check.

## 2015-05-18 ENCOUNTER — Other Ambulatory Visit: Payer: Self-pay | Admitting: Family Medicine

## 2015-05-25 ENCOUNTER — Other Ambulatory Visit: Payer: Self-pay | Admitting: Family Medicine

## 2015-05-25 MED ORDER — BENZONATATE 100 MG PO CAPS: 100.0000 mg | ORAL_CAPSULE | Freq: Three times a day (TID) | ORAL | Status: DC | PRN

## 2015-06-09 ENCOUNTER — Other Ambulatory Visit: Payer: Self-pay | Admitting: Family Medicine

## 2015-06-09 NOTE — Telephone Encounter (Signed)
RN staff - please call in Tramadol 50 mg Q 8 hours prn pain, dispense #60, refill #1

## 2015-06-09 NOTE — Telephone Encounter (Signed)
Called in rx to the outpatient pharmacy. Sejla Marzano Kennon Holter, CMA

## 2015-06-15 ENCOUNTER — Other Ambulatory Visit: Payer: Self-pay | Admitting: Family Medicine

## 2015-06-15 MED ORDER — ROPINIROLE HCL 1 MG PO TABS: 1.0000 mg | ORAL_TABLET | Freq: Every day | ORAL | Status: DC

## 2015-06-20 DIAGNOSIS — K219 Gastro-esophageal reflux disease without esophagitis: Secondary | ICD-10-CM | POA: Insufficient documentation

## 2015-07-01 ENCOUNTER — Other Ambulatory Visit: Payer: Self-pay | Admitting: Family Medicine

## 2015-07-01 MED ORDER — BENZONATATE 100 MG PO CAPS: 100.0000 mg | ORAL_CAPSULE | Freq: Three times a day (TID) | ORAL | Status: DC | PRN

## 2015-07-02 ENCOUNTER — Other Ambulatory Visit: Payer: Self-pay | Admitting: Family Medicine

## 2015-07-10 ENCOUNTER — Encounter: Payer: Self-pay | Admitting: Family Medicine

## 2015-07-10 ENCOUNTER — Ambulatory Visit (INDEPENDENT_AMBULATORY_CARE_PROVIDER_SITE_OTHER): Payer: 59 | Admitting: Family Medicine

## 2015-07-10 VITALS — BP 128/58 | HR 78 | Temp 98.7°F

## 2015-07-10 DIAGNOSIS — F329 Major depressive disorder, single episode, unspecified: Secondary | ICD-10-CM | POA: Diagnosis not present

## 2015-07-10 DIAGNOSIS — R079 Chest pain, unspecified: Secondary | ICD-10-CM

## 2015-07-10 DIAGNOSIS — F32A Depression, unspecified: Secondary | ICD-10-CM

## 2015-07-10 DIAGNOSIS — G2581 Restless legs syndrome: Secondary | ICD-10-CM

## 2015-07-10 MED ORDER — FLUOXETINE HCL 20 MG PO TABS: 40.0000 mg | ORAL_TABLET | Freq: Every day | ORAL | Status: DC

## 2015-07-10 NOTE — Assessment & Plan Note (Signed)
Symptoms worsened in past few weeks. Suspect likely due to increased stress/anxiety.  -no change in Ropinirole at this time -monitor clinically

## 2015-07-10 NOTE — Patient Instructions (Signed)
Depression/Anxiety - increase Prozac to 40 mg daily (take two 20 mg tablets daily)  Chest pain - I think this is related to your anxiety/panic.  Attempt trial of medication: Headspace App

## 2015-07-10 NOTE — Assessment & Plan Note (Signed)
Hospital follow up from [facility] 11/19-11/21. Cardiac workup including cardiac cath negative. Clinically suspect due to increased anxiety/depression. -obtain records from [facility] -see plan for anxiety/depression -no plan for further cardiac workup at this time

## 2015-07-10 NOTE — Progress Notes (Signed)
   Subjective:    Patient ID: Sheryl Cooper, female    DOB: 1961/06/11, 54 y.o.   MRN: [id number]  HPI 54 y/o female presents for hospital follow up. Admitted 11/19 - 11/21 for evaluation of chest pain. Cardiac workup including cardiac cath negative per patient. (Records not available for review).  Chest pain - intermittent, stabbing, some associated sob, associated with anxiety/panic, resolve spontaneously, no relation to exertion, no fevers/chills/cough.  Depression/Anxiety - patient reports almost daily panic attacks, has lots of stress related to work and home situation (has multiple adult children that live at home). See below for PHQ 9 and GAD 7 score. No suicidal thoughts.  Restless leg Syndrome - symptoms worse over the past few weeks.  Social - increased life stressors (work and home life), does not exercise regularly, non-smoker   Review of Systems  Constitutional: Negative for fever, chills and fatigue.  Respiratory: Positive for chest tightness and shortness of breath.   Cardiovascular: Positive for chest pain.  Gastrointestinal: Negative for nausea, vomiting and diarrhea.       Objective:   Physical Exam Vitals: reviewed Gen: pleasant, anxious appearing, Caucasian female Cardiac: RRR, S1 and S2 present, no murmur Resp: CTAB, normal effort Psych: appropriately dressed, mood is described as anxious, no suicidal or homicidal thoughts  PHQ 9 score of 19 GAD 7 score of 16     Assessment & Plan:  Chest pain Hospital follow up from [facility] 11/19-11/21. Cardiac workup including cardiac cath negative. Clinically suspect due to increased anxiety/depression. -obtain records from [facility] -see plan for anxiety/depression -no plan for further cardiac workup at this time  Restless leg syndrome Symptoms worsened in past few weeks. Suspect likely due to increased stress/anxiety.  -no change in Ropinirole at this time -monitor  clinically  Depression Worsening Depression/Anxiety. PHQ 9 score of 19 and GAD 7 score of 16. -patient not interested in psychology referral at this time -increase Prozac to 40 mg daily -continue current dose of Wellbutrin 150 XR BID -return in 2 weeks for follow up

## 2015-07-10 NOTE — Assessment & Plan Note (Signed)
Worsening Depression/Anxiety. PHQ 9 score of 19 and GAD 7 score of 16. -patient not interested in psychology referral at this time -increase Prozac to 40 mg daily -continue current dose of Wellbutrin 150 XR BID -return in 2 weeks for follow up

## 2015-07-13 ENCOUNTER — Other Ambulatory Visit: Payer: Self-pay | Admitting: Family Medicine

## 2015-08-02 DIAGNOSIS — K912 Postsurgical malabsorption, not elsewhere classified: Secondary | ICD-10-CM | POA: Diagnosis not present

## 2015-08-03 MED FILL — traMADol HCL 50 MG TABS: 50 | 20 days supply | Qty: 60 | Fill #0

## 2015-08-07 MED FILL — CYCLOBENZAPRINE 10 MG TAB: 10 | 10 days supply | Qty: 30 | Fill #0

## 2015-08-12 ENCOUNTER — Other Ambulatory Visit: Payer: Self-pay | Admitting: Family Medicine

## 2015-08-12 MED FILL — rOPINIRole HCL 1 MG TABS: 1 | 30 days supply | Qty: 30 | Fill #2

## 2015-08-12 MED FILL — PANTOPRAZOLE SOD DR 40 MG T: 40 | 30 days supply | Qty: 30 | Fill #2

## 2015-08-12 MED FILL — BUPROPION SR 150 MG TABLET: 150 | 30 days supply | Qty: 60 | Fill #0

## 2015-08-21 ENCOUNTER — Other Ambulatory Visit: Payer: Self-pay | Admitting: Family Medicine

## 2015-08-21 DIAGNOSIS — I10 Essential (primary) hypertension: Secondary | ICD-10-CM

## 2015-08-21 MED ORDER — HYDROCHLOROTHIAZIDE 25 MG PO TABS: 25.0000 mg | ORAL_TABLET | Freq: Every day | ORAL | Status: DC

## 2015-08-21 MED ORDER — LOSARTAN POTASSIUM 50 MG PO TABS: 50.0000 mg | ORAL_TABLET | Freq: Every day | ORAL | Status: DC

## 2015-08-21 MED FILL — HYDROCHLOROTHIAZIDE 25 MG T: 25 | 90 days supply | Qty: 90 | Fill #0

## 2015-08-21 MED FILL — LOSARTAN POTASSIUM 50 MG TA: 50 | 90 days supply | Qty: 90 | Fill #0

## 2015-08-21 MED FILL — MELOXICAM 15 MG TABLET: 15 | 90 days supply | Qty: 90 | Fill #1

## 2015-08-21 NOTE — Progress Notes (Signed)
Patient reports chronic dry cough. ? Due to lisinopril. Will attempt trial of ARB.

## 2015-09-02 DIAGNOSIS — K912 Postsurgical malabsorption, not elsewhere classified: Secondary | ICD-10-CM | POA: Diagnosis not present

## 2015-09-07 MED FILL — FLUoxetine HCL 20 MG CAPS: 20 | 30 days supply | Qty: 60 | Fill #1

## 2015-09-07 MED FILL — PANTOPRAZOLE SOD DR 40 MG T: 40 | 30 days supply | Qty: 30 | Fill #3

## 2015-09-07 MED FILL — traMADol HCL 50 MG TABS: 50 | 20 days supply | Qty: 60 | Fill #1

## 2015-09-10 ENCOUNTER — Other Ambulatory Visit: Payer: Self-pay | Admitting: Family Medicine

## 2015-09-10 MED FILL — rOPINIRole HCL 1 MG TABS: 1 | 30 days supply | Qty: 30 | Fill #0

## 2015-09-11 ENCOUNTER — Encounter: Payer: Self-pay | Admitting: Family Medicine

## 2015-09-11 ENCOUNTER — Ambulatory Visit (INDEPENDENT_AMBULATORY_CARE_PROVIDER_SITE_OTHER): Payer: 59 | Admitting: Family Medicine

## 2015-09-11 DIAGNOSIS — F32A Depression, unspecified: Secondary | ICD-10-CM

## 2015-09-11 DIAGNOSIS — G2581 Restless legs syndrome: Secondary | ICD-10-CM | POA: Diagnosis not present

## 2015-09-11 DIAGNOSIS — F329 Major depressive disorder, single episode, unspecified: Secondary | ICD-10-CM | POA: Diagnosis not present

## 2015-09-11 MED ORDER — PHENTERMINE HCL 37.5 MG PO TABS: 37.5000 mg | ORAL_TABLET | Freq: Every day | ORAL | Status: DC

## 2015-09-11 MED FILL — PHENTERMINE 37.5 MG TABLET: 37.5 | 30 days supply | Qty: 30 | Fill #0

## 2015-09-11 NOTE — Assessment & Plan Note (Signed)
Patient stopped Adipex in September. Would like to restart. -restart Adipex -discussed lifestyle modifications -encouraged exercise 3-4 times per week and healthy eating habits -return in one month for weight check

## 2015-09-11 NOTE — Assessment & Plan Note (Signed)
Improved on Prozac 40 mg daily. No PHQ completed today. -continue Prozac 40 mg daily -continue Wellbutrin XR 150 mg BID

## 2015-09-11 NOTE — Progress Notes (Signed)
   Subjective:    Patient ID: Sheryl Cooper, female    DOB: 02/02/1961, 55 y.o.   MRN: [id number]  HPI 55 y/o female presents to discuss weight loss.  Obesity: Exercise - 3-4 times per week at [employer] (30 minute circuit), walking intermittently.  Meds: non currently, would like to restart Phentermine Diet: "not good at the moment", trying to do better over the past few weeks, has cut out soda/ice cream/bread/pasta, eating 3 meals per day   Restless legs: Stable, improved with Requip  Depression: Mood is improved with increased dose of Prozac 40 mg daily Wellbutrin 150 mg BID States that she is "trying to let things go" Not currently seeing a psychologist/psychiatrist  Social - nonsmoker, one child traveling to [place] in March   Review of Systems  Constitutional: Negative for fever, chills and fatigue.  Respiratory: Negative for cough and choking.   Cardiovascular: Negative for chest pain.  Gastrointestinal: Negative for nausea and vomiting.       Objective:   Physical Exam  Filed Vitals:   09/11/15 0824  BP: 140/68  Pulse: 78  Temp: 98.5 F (36.9 C)   Filed Weights   09/11/15 0824  Weight: 263 lb (119.296 kg)   Gen: pleasant female, NAD Cardiac: RRR, S1 and S2 present, no murmur Resp: CTAB, normal effort Psych: dressed appropriately, affect outgoing, mood expressed as mildly depressed, no tangential thoughts, no flight of ideas, no pressured speech, no SI, no HI  Reviewed labs from 01/2015     Assessment & Plan:  No problem-specific assessment & plan notes found for this encounter.

## 2015-09-11 NOTE — Assessment & Plan Note (Signed)
Stable. Continue Ropinirole.

## 2015-09-11 NOTE — Patient Instructions (Signed)
It was nice to see you.  Weight loss - restart Phentermine, continue to exercise 3-4 times per week, congratulations on making some diet changes  Restless legs - continue Ropinirole  Depression/mood - continue Wellbutrin and Prozac

## 2015-09-14 MED FILL — BUPROPION SR 150 MG TABLET: 150 | 30 days supply | Qty: 60 | Fill #1

## 2015-09-18 ENCOUNTER — Ambulatory Visit: Payer: 59 | Admitting: Family Medicine

## 2015-10-01 DIAGNOSIS — K912 Postsurgical malabsorption, not elsewhere classified: Secondary | ICD-10-CM | POA: Diagnosis not present

## 2015-10-08 ENCOUNTER — Other Ambulatory Visit: Payer: Self-pay | Admitting: *Deleted

## 2015-10-08 DIAGNOSIS — K8070 Calculus of gallbladder and bile duct without cholecystitis without obstruction: Secondary | ICD-10-CM

## 2015-10-08 DIAGNOSIS — R1013 Epigastric pain: Secondary | ICD-10-CM

## 2015-10-08 DIAGNOSIS — R109 Unspecified abdominal pain: Secondary | ICD-10-CM

## 2015-10-08 DIAGNOSIS — R140 Abdominal distension (gaseous): Secondary | ICD-10-CM

## 2015-10-08 MED FILL — rOPINIRole HCL 1 MG TABS: 1 | 30 days supply | Qty: 30 | Fill #1

## 2015-10-08 MED FILL — FLUoxetine HCL 20 MG CAPS: 20 | 30 days supply | Qty: 60 | Fill #2

## 2015-10-09 MED ORDER — PANTOPRAZOLE SODIUM 40 MG PO TBEC: DELAYED_RELEASE_TABLET | ORAL | Status: DC

## 2015-10-09 MED FILL — PANTOPRAZOLE SOD DR 40 MG T: 40 | 90 days supply | Qty: 90 | Fill #0

## 2015-10-14 MED FILL — BUPROPION SR 150 MG TABLET: 150 | 30 days supply | Qty: 60 | Fill #2

## 2015-10-14 MED FILL — ASPIR-LOW EC 81 MG TABLET: 81 | 90 days supply | Qty: 90 | Fill #1

## 2015-10-23 ENCOUNTER — Telehealth: Payer: Self-pay | Admitting: Family Medicine

## 2015-10-23 MED ORDER — DICLOFENAC SODIUM 1 % TD GEL: 4.0000 g | Freq: Four times a day (QID) | TRANSDERMAL | Status: DC

## 2015-10-23 MED FILL — DICLOFENAC SODIUM 1% GEL: 1 | 10 days supply | Qty: 100 | Fill #0

## 2015-10-23 NOTE — Telephone Encounter (Signed)
Pt needs refill on diclofenac sodium.  Please send to outpatient pharmacy

## 2015-10-23 NOTE — Telephone Encounter (Signed)
Refill sent to pharmacy.   

## 2015-10-30 ENCOUNTER — Encounter: Payer: Self-pay | Admitting: Family Medicine

## 2015-10-30 ENCOUNTER — Ambulatory Visit (INDEPENDENT_AMBULATORY_CARE_PROVIDER_SITE_OTHER): Payer: 59 | Admitting: Family Medicine

## 2015-10-30 VITALS — BP 131/63 | HR 77 | Temp 98.6°F | Ht 65.0 in | Wt 245.0 lb

## 2015-10-30 DIAGNOSIS — Z0000 Encounter for general adult medical examination without abnormal findings: Secondary | ICD-10-CM | POA: Insufficient documentation

## 2015-10-30 DIAGNOSIS — I10 Essential (primary) hypertension: Secondary | ICD-10-CM | POA: Diagnosis not present

## 2015-10-30 LAB — CBC
HCT: 35.1 % — ABNORMAL LOW (ref 36.0–46.0)
HEMOGLOBIN: 11.5 g/dL — AB (ref 12.0–15.0)
MCH: 26.7 pg (ref 26.0–34.0)
MCHC: 32.8 g/dL (ref 30.0–36.0)
MCV: 81.6 fL (ref 78.0–100.0)
MPV: 10.1 fL (ref 8.6–12.4)
PLATELETS: 360 10*3/uL (ref 150–400)
RBC: 4.3 MIL/uL (ref 3.87–5.11)
RDW: 15.4 % (ref 11.5–15.5)
WBC: 5.6 10*3/uL (ref 4.0–10.5)

## 2015-10-30 LAB — COMPLETE METABOLIC PANEL WITH GFR
ALBUMIN: 4 g/dL (ref 3.6–5.1)
ALK PHOS: 62 U/L (ref 33–130)
ALT: 12 U/L (ref 6–29)
AST: 22 U/L (ref 10–35)
BILIRUBIN TOTAL: 0.5 mg/dL (ref 0.2–1.2)
BUN: 20 mg/dL (ref 7–25)
CO2: 25 mmol/L (ref 20–31)
CREATININE: 0.87 mg/dL (ref 0.50–1.05)
Calcium: 8.7 mg/dL (ref 8.6–10.4)
Chloride: 105 mmol/L (ref 98–110)
GFR, Est African American: 86 mL/min (ref >=60)
GFR, Est Non African American: 74 mL/min (ref >=60)
GLUCOSE: 88 mg/dL (ref 65–99)
Potassium: 4.4 mmol/L (ref 3.5–5.3)
SODIUM: 139 mmol/L (ref 135–146)
TOTAL PROTEIN: 6.5 g/dL (ref 6.1–8.1)

## 2015-10-30 LAB — LIPID PANEL
CHOL/HDL RATIO: 2 ratio (ref <=5.0)
Cholesterol: 113 mg/dL — ABNORMAL LOW (ref 125–200)
HDL: 57 mg/dL (ref >=46)
LDL Cholesterol: 44 mg/dL (ref <130)
Triglycerides: 61 mg/dL (ref <150)
VLDL: 12 mg/dL (ref <30)

## 2015-10-30 LAB — TSH: TSH: 1.14 mIU/L

## 2015-10-30 LAB — HEPATITIS C ANTIBODY: HCV AB: NEGATIVE

## 2015-10-30 MED ORDER — PHENTERMINE HCL 37.5 MG PO CAPS
37.5000 mg | ORAL_CAPSULE | ORAL | Status: DC
Start: 2015-10-30 — End: 2016-01-14

## 2015-10-30 MED FILL — PHENTERMINE 37.5 MG TABLET: 37.5 | 30 days supply | Qty: 30 | Fill #0

## 2015-10-30 NOTE — Patient Instructions (Addendum)
Thank you for coming in today.   Please continue to exercise 2-3 times per week. Refill of Phentermine provided  Please return in one month for recheck of weight.  Please schedule a follow up with your GYN provider.

## 2015-10-30 NOTE — Progress Notes (Signed)
   Subjective:    Patient ID: Sheryl Cooper, female    DOB: November 01, 1960, 55 y.o.   MRN: [id number]  HPI 55 y/o female presents for weight check.  Morbid Obesity Restarted Adipex one month ago, not taking daily "takes when needs extra help" exercising 2-3 times per week ([employer] and exercise class at work). Down 18 pounds from last visit. Attempting to eat a healthier diet. Protein shake in the morning Lunch - boiled eggs, vegetables, yogurt with granola; Dinner - V8, small piece of meat or soup, drinking close to a gallon of water per day. Only weighs self once per week.   HTN - no chest pain, no sob (only with extreme exertion), takes HCTZ daily, no side effects  HM - due for HCV and Hepatitis. Gets annual well woman exam completed at [facility].   FH: Mother - history of breast cancer, cervical cancer, and skin cancer (updated in EPIC).   Social - nonsmoker  Review of Systems     Objective:   Physical Exam BP 131/63 mmHg  Pulse 77  Temp(Src) 98.6 F (37 C) (Oral)  Ht 5\' 5"  (1.651 m)  Wt 245 lb (111.131 kg)  BMI 40.77 kg/m2  LMP 08/17/2012  Gen: pleasant female, NAD Cardiac: RRR, S1 and S2 present, no murmur Resp: CTAB, normal effort     Assessment & Plan:  Morbid obesity ([place]) 18 pound weight loss since last visit. -continue diet and activity changes -refill of Phentermine provided -return for weight check in one month  HTN (hypertension) Blood pressure controlled on HCTZ 25 mg and Losartan 50 mg daily. -no changes in therapy -check CMP/CBC/Lipids/TSH  Health care maintenance Due for HIV and HCV. No current high risk sexual activity or history of blood transfusion. Encouraged follow up with GYN physician.

## 2015-10-30 NOTE — Assessment & Plan Note (Signed)
Due for HIV and HCV. No current high risk sexual activity or history of blood transfusion. Encouraged follow up with GYN physician.

## 2015-10-30 NOTE — Assessment & Plan Note (Signed)
18 pound weight loss since last visit. -continue diet and activity changes -refill of Phentermine provided -return for weight check in one month

## 2015-10-30 NOTE — Assessment & Plan Note (Signed)
Blood pressure controlled on HCTZ 25 mg and Losartan 50 mg daily. -no changes in therapy -check CMP/CBC/Lipids/TSH

## 2015-10-31 LAB — HIV ANTIBODY (ROUTINE TESTING W REFLEX): HIV 1&2 Ab, 4th Generation: NONREACTIVE

## 2015-11-06 ENCOUNTER — Other Ambulatory Visit: Payer: Self-pay | Admitting: *Deleted

## 2015-11-06 MED ORDER — FLUOXETINE HCL 20 MG PO TABS: 40.0000 mg | ORAL_TABLET | Freq: Every day | ORAL | Status: DC

## 2015-11-06 MED FILL — FLUoxetine HCL 20 MG CAPS: 20 | 30 days supply | Qty: 60 | Fill #0

## 2015-11-06 NOTE — Telephone Encounter (Signed)
90 day supply. Martin, Tamika L, RN  

## 2015-11-09 MED FILL — rOPINIRole HCL 1 MG TABS: 1 | 30 days supply | Qty: 30 | Fill #2

## 2015-11-13 MED FILL — BUPROPION SR 150 MG TABLET: 150 | 30 days supply | Qty: 60 | Fill #3

## 2015-11-19 ENCOUNTER — Other Ambulatory Visit: Payer: Self-pay | Admitting: Family Medicine

## 2015-11-19 MED FILL — HYDROCHLOROTHIAZIDE 25 MG T: 25 | 90 days supply | Qty: 90 | Fill #0

## 2015-11-19 MED FILL — LOSARTAN POTASSIUM 50 MG TA: 50 | 90 days supply | Qty: 90 | Fill #0

## 2015-11-25 ENCOUNTER — Other Ambulatory Visit: Payer: Self-pay | Admitting: Family Medicine

## 2015-11-25 MED FILL — MELOXICAM 15 MG TABLET: 15 | 90 days supply | Qty: 90 | Fill #0

## 2015-11-25 NOTE — Telephone Encounter (Signed)
RN staff - please call in Tramadol 50 mg Q8 hours prn pain, dispense #60, refill #1, thanks

## 2015-11-26 MED FILL — traMADol HCL 50 MG TABS: 50 | 20 days supply | Qty: 60 | Fill #0

## 2015-11-26 NOTE — Telephone Encounter (Signed)
Rx called into patient pharmacy. 

## 2015-12-03 ENCOUNTER — Other Ambulatory Visit: Payer: Self-pay | Admitting: Family Medicine

## 2015-12-03 MED FILL — FLUoxetine HCL 20 MG CAPS: 20 | 30 days supply | Qty: 60 | Fill #1

## 2015-12-03 MED FILL — rOPINIRole HCL 1 MG TABS: 1 | 30 days supply | Qty: 30 | Fill #0

## 2015-12-14 MED FILL — BUPROPION SR 150 MG TABLET: 150 | 30 days supply | Qty: 60 | Fill #4

## 2016-01-07 MED FILL — rOPINIRole HCL 1 MG TABS: 1 | 30 days supply | Qty: 30 | Fill #1

## 2016-01-07 MED FILL — PANTOPRAZOLE SOD DR 40 MG T: 40 | 90 days supply | Qty: 90 | Fill #1

## 2016-01-07 MED FILL — FLUoxetine HCL 20 MG CAPS: 20 | 30 days supply | Qty: 60 | Fill #2

## 2016-01-13 MED FILL — BUPROPION SR 150 MG TABLET: 150 | 30 days supply | Qty: 60 | Fill #5

## 2016-01-13 MED FILL — ASPIR-LOW EC 81 MG TABLET: 81 | 90 days supply | Qty: 90 | Fill #2

## 2016-01-14 ENCOUNTER — Ambulatory Visit (INDEPENDENT_AMBULATORY_CARE_PROVIDER_SITE_OTHER): Payer: 59 | Admitting: Family Medicine

## 2016-01-14 VITALS — BP 140/68 | HR 75 | Temp 98.4°F | Wt 239.0 lb

## 2016-01-14 DIAGNOSIS — F418 Other specified anxiety disorders: Secondary | ICD-10-CM

## 2016-01-14 DIAGNOSIS — F32A Depression, unspecified: Secondary | ICD-10-CM

## 2016-01-14 DIAGNOSIS — F419 Anxiety disorder, unspecified: Principal | ICD-10-CM

## 2016-01-14 DIAGNOSIS — F329 Major depressive disorder, single episode, unspecified: Secondary | ICD-10-CM | POA: Diagnosis not present

## 2016-01-14 MED ORDER — FLUTICASONE PROPIONATE 50 MCG/ACT NA SUSP: 2.0000 | Freq: Every day | NASAL | Status: DC

## 2016-01-14 MED ORDER — PHENTERMINE HCL 37.5 MG PO CAPS: 37.5000 mg | ORAL_CAPSULE | ORAL | Status: DC

## 2016-01-14 MED ORDER — FLUOXETINE HCL 20 MG PO TABS: 60.0000 mg | ORAL_TABLET | Freq: Every day | ORAL | Status: DC

## 2016-01-14 MED FILL — FLUTICASONE PROP 50 MCG SPR: 50 | 30 days supply | Qty: 16 | Fill #0

## 2016-01-14 MED FILL — PHENTERMINE 37.5 MG TABLET: 37.5 | 30 days supply | Qty: 30 | Fill #0

## 2016-01-14 NOTE — Assessment & Plan Note (Signed)
Weight down to 239 lbs.  -refill of Phentermine provided -encouraged continued lifestyle modifications -weight check in one month

## 2016-01-14 NOTE — Patient Instructions (Signed)
It was nice to see you today.  Increase Prozac to 60 mg daily. Continue Wellbutrin 300 mg daily. A referral has been placed to [facility].  A refill of Phentermine was printed for you. Continue to exercise 4-5 times per week.   Return in 2-3 weeks.

## 2016-01-14 NOTE — Assessment & Plan Note (Signed)
Uncontrolled related to recent social issues (see note). GAD7 score of 19. PHQ9 score of 20. -increase Prozac to 60 mg daily -continue Wellbutrin 300 mg daily -referral to [facility]

## 2016-01-14 NOTE — Progress Notes (Signed)
   Subjective:    Patient ID: Sheryl Cooper, female    DOB: [birth year], 55 y.o.   MRN: [id number]  HPI 55 y/o female presents for follow up of anxiety/depression.  Anxiety/Depression - currently on Prozac 40 mg and Wellbutrin 300 mg daily, not currently in behavioral therapy, worsening symptoms over past month, related to foreclosure on home (see below). Worsening insomnia.   Obesity - exercise has decreased to 2-3 times per week, has lost approx. 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    Review of Systems  Constitutional: Negative for fever, chills and fatigue.  Respiratory: Negative for cough.   Cardiovascular: Negative for chest pain.  Gastrointestinal: Negative for nausea, vomiting and diarrhea.       Objective:   Physical Exam  Wt 239 lb (108.41 kg)  LMP 08/17/2012  Gen: pleasant female, NAD Cardiac: RRR, S1 and S2 present, no murmur Resp: CTAB, normal effort Psych: alert, appropriately dressed, affect is outgoing, mood is depressed, no SI, no HI, no tangential thoughts  PHQ9 score of 19 (somewhat difficult) GAD7 score of 20 (somewhat difficult)     Assessment & Plan:  Anxiety and depression Uncontrolled related to recent social issues (see note). GAD7 score of 19. PHQ9 score of 20. -increase Prozac to 60 mg daily -continue Wellbutrin 300 mg daily -referral to [facility]  Morbid obesity ([place]) Weight down to 239 lbs.  -refill of Phentermine provided -encouraged continued lifestyle modifications -weight check in one month

## 2016-01-26 ENCOUNTER — Telehealth: Payer: Self-pay | Admitting: *Deleted

## 2016-01-26 MED ORDER — BENZONATATE 100 MG PO CAPS: 100.0000 mg | ORAL_CAPSULE | Freq: Two times a day (BID) | ORAL | Status: DC | PRN

## 2016-01-26 MED FILL — BENZONATATE 100 MG CAPSULE: 100 | 15 days supply | Qty: 30 | Fill #0

## 2016-01-26 NOTE — Telephone Encounter (Signed)
Sent in [place].

## 2016-01-26 NOTE — Telephone Encounter (Signed)
Pt wants to know if you can send her in some tessalon perles for her cough. Please advise. Deseree Kennon Holter, CMA

## 2016-01-27 MED ORDER — ALBUTEROL SULFATE HFA 108 (90 BASE) MCG/ACT IN AERS: 2.0000 | INHALATION_SPRAY | Freq: Four times a day (QID) | RESPIRATORY_TRACT | Status: DC | PRN

## 2016-01-27 MED FILL — VENTOLIN HFA 90 MCG INHALER: 108 (90 BAS | 25 days supply | Qty: 18 | Fill #0

## 2016-01-27 NOTE — Addendum Note (Signed)
Addended by: Lupita Dawn on: 01/27/2016 08:51 AM   Modules accepted: Orders

## 2016-01-27 NOTE — Telephone Encounter (Signed)
Patient continues to have cough and wheeze. Sent in albuterol.

## 2016-01-29 ENCOUNTER — Other Ambulatory Visit: Payer: Self-pay | Admitting: Family Medicine

## 2016-01-29 MED ORDER — FLUOXETINE HCL 20 MG PO TABS: 60.0000 mg | ORAL_TABLET | Freq: Every day | ORAL | Status: DC

## 2016-01-29 MED FILL — DICLOFENAC SODIUM 1% GEL: 1 | 10 days supply | Qty: 100 | Fill #1

## 2016-01-29 MED FILL — FLUoxetine HCL 20 MG CAPS: 20 | 30 days supply | Qty: 90 | Fill #0

## 2016-02-05 MED FILL — rOPINIRole HCL 1 MG TABS: 1 | 30 days supply | Qty: 30 | Fill #2

## 2016-02-11 ENCOUNTER — Encounter: Payer: Self-pay | Admitting: Family Medicine

## 2016-02-11 ENCOUNTER — Ambulatory Visit (INDEPENDENT_AMBULATORY_CARE_PROVIDER_SITE_OTHER): Payer: 59 | Admitting: Family Medicine

## 2016-02-11 DIAGNOSIS — F419 Anxiety disorder, unspecified: Secondary | ICD-10-CM

## 2016-02-11 DIAGNOSIS — F418 Other specified anxiety disorders: Secondary | ICD-10-CM | POA: Diagnosis not present

## 2016-02-11 DIAGNOSIS — F32A Depression, unspecified: Secondary | ICD-10-CM

## 2016-02-11 DIAGNOSIS — F329 Major depressive disorder, single episode, unspecified: Secondary | ICD-10-CM

## 2016-02-11 DIAGNOSIS — Z0000 Encounter for general adult medical examination without abnormal findings: Secondary | ICD-10-CM | POA: Diagnosis not present

## 2016-02-11 MED ORDER — PHENTERMINE HCL 37.5 MG PO CAPS: 37.5000 mg | ORAL_CAPSULE | ORAL | Status: DC

## 2016-02-11 MED ORDER — BUPROPION HCL ER (SR) 150 MG PO TB12: ORAL_TABLET | ORAL | Status: DC

## 2016-02-11 MED FILL — BUPROPION SR 150 MG TABLET: 150 | 30 days supply | Qty: 60 | Fill #0

## 2016-02-11 MED FILL — PHENTERMINE 37.5 MG TABLET: 37.5 | 30 days supply | Qty: 30 | Fill #0

## 2016-02-11 NOTE — Assessment & Plan Note (Signed)
Improved from visit one month ago -continue Prozac 60 mg daily and Wellbutrin 300 mg daily -patient to call Behavioral Health to check status of referral

## 2016-02-11 NOTE — Assessment & Plan Note (Signed)
Patient to call OB/GYN to schedule Pap Smear. Patient to call to schedule mammogram.

## 2016-02-11 NOTE — Assessment & Plan Note (Signed)
Patient has had 31 pound weight loss since 09/2015. -Encouraged continued lifestyle changes -One month supply of Phentermine -BP controlled -Return in one month for recheck of weight.

## 2016-02-11 NOTE — Progress Notes (Signed)
   Subjective:    Patient ID: Sheryl Cooper, female    DOB: 1961/01/23, 55 y.o.   MRN: [id number]  HPI  55 y/o female presents for follow up of Anxiety and Depression.  Anxiety/Depression - at last visit increased Prozac to 60 mg daily and kept Wellbutrin, improved from last visit, related to social stressors, currently in same house, sons have moved out of the house, plans to live in daughter's house and watch grandson for the next month, needs refill of Wellbutrin, no HI/SI. Has referral to [facility] - has not heard from their office.   Obesity - down 31 pounds since February, walking every other day (45 minutes). Has stopped going to the gym since watching grandson, has been taking Phentermine daily for the past month, has been watching diet, attempting to cut down on snacks. AM - protein shake, Lunch - fruit and vegetables, chickem; Dinner - meat + fruit/vegetables.   [facility], coming due for mammogram.   Social - nonsmoker, bought new car (2014 [place]).    Review of Systems  Constitutional: Negative for fever, chills and fatigue.  Respiratory: Negative for shortness of breath.        Objective:   Physical Exam BP 112/77 mmHg  Pulse 82  Temp(Src) 98 F (36.7 C) (Oral)  Ht 5\' 5"  (1.651 m)  Wt 232 lb (105.235 kg)  BMI 38.61 kg/m2  LMP 08/17/2012 Gen: pleasant female, NAD Psych: well groomed, mood is mild depressed, affect outgoing, no tangential thoughts, no flight of ideas, no SI, no HI.        Assessment & Plan:  Morbid obesity ([place]) Patient has had 31 pound weight loss since 09/2015. -Encouraged continued lifestyle changes -One month supply of Phentermine -BP controlled -Return in one month for recheck of weight.  Anxiety and depression Improved from visit one month ago -continue Prozac 60 mg daily and Wellbutrin 300 mg daily -patient to call Behavioral Health to check status of  referral  Health care maintenance Patient to call OB/GYN to schedule Pap Smear. Patient to call to schedule mammogram.

## 2016-02-11 NOTE — Patient Instructions (Signed)
It was nice to see you today.  Continue Prozac at 60 mg. No change to Wellbutrin. Call [facility] to follow up on referral.   Continue to exercise daily. CONGRATULATION's on losing 31 pounds.   Follow up in one month.

## 2016-02-15 ENCOUNTER — Other Ambulatory Visit: Payer: Self-pay | Admitting: Family Medicine

## 2016-02-15 DIAGNOSIS — Z1231 Encounter for screening mammogram for malignant neoplasm of breast: Secondary | ICD-10-CM

## 2016-02-16 MED FILL — LOSARTAN POTASSIUM 50 MG TA: 50 | 90 days supply | Qty: 90 | Fill #1

## 2016-02-16 MED FILL — HYDROCHLOROTHIAZIDE 25 MG T: 25 | 90 days supply | Qty: 90 | Fill #1

## 2016-02-22 ENCOUNTER — Ambulatory Visit: Payer: 59

## 2016-02-29 MED FILL — FLUoxetine HCL 20 MG CAPS: 20 | 30 days supply | Qty: 90 | Fill #1

## 2016-02-29 MED FILL — MELOXICAM 15 MG TABLET: 15 | 90 days supply | Qty: 90 | Fill #1

## 2016-03-07 ENCOUNTER — Other Ambulatory Visit: Payer: Self-pay | Admitting: Family Medicine

## 2016-03-07 MED FILL — rOPINIRole HCL 1 MG TABS: 1 | 30 days supply | Qty: 30 | Fill #0

## 2016-03-14 MED FILL — BUPROPION HCL SR 150 MG TAB: 150 | 30 days supply | Qty: 60 | Fill #1

## 2016-03-25 ENCOUNTER — Other Ambulatory Visit: Payer: Self-pay | Admitting: Family Medicine

## 2016-03-25 DIAGNOSIS — M545 Low back pain, unspecified: Secondary | ICD-10-CM

## 2016-03-25 MED ORDER — ONDANSETRON 4 MG PO TBDP: 4.0000 mg | ORAL_TABLET | Freq: Three times a day (TID) | ORAL | 0 refills | Status: DC | PRN

## 2016-03-25 MED FILL — ONDANSETRON ODT 4 MG TABLET: 4 | 7 days supply | Qty: 20 | Fill #0

## 2016-03-30 ENCOUNTER — Other Ambulatory Visit: Payer: Self-pay | Admitting: Family Medicine

## 2016-03-30 MED ORDER — FLUOXETINE HCL 20 MG PO TABS: 60.0000 mg | ORAL_TABLET | Freq: Every day | ORAL | 1 refills | Status: DC

## 2016-03-30 NOTE — Telephone Encounter (Signed)
Needs refill  On fluoxetine. Please call it in to the [facility] on [address] in [place]

## 2016-03-31 ENCOUNTER — Encounter: Payer: Self-pay | Admitting: *Deleted

## 2016-03-31 ENCOUNTER — Ambulatory Visit: Payer: 59 | Admitting: Obstetrics and Gynecology

## 2016-03-31 NOTE — Progress Notes (Signed)
Sheryl Cooper missed a scheduled appointment for annual exam.  Per discussion with provider no need to call patient.  If she calls, may be rescheduled.

## 2016-04-01 IMAGING — CT CT ABD-PELV W/O CM
2 of 4 series · 8 of 46 positions shown, 9 images · non-contrast
Comparison: KUB prior to upper GI of 12/11/2014

CLINICAL DATA: Low back pain, nausea

EXAM:
CT ABDOMEN AND PELVIS WITHOUT CONTRAST
TECHNIQUE: Multidetector CT imaging of the abdomen and pelvis was performed
following the standard protocol without IV contrast.

[Series 201: routine, idose (2) · axial · 0.77mm/px · z∈[-396,-31]mm · 5 of 99 slices shown, 6 images]
[im 13/99  soft-tissue]
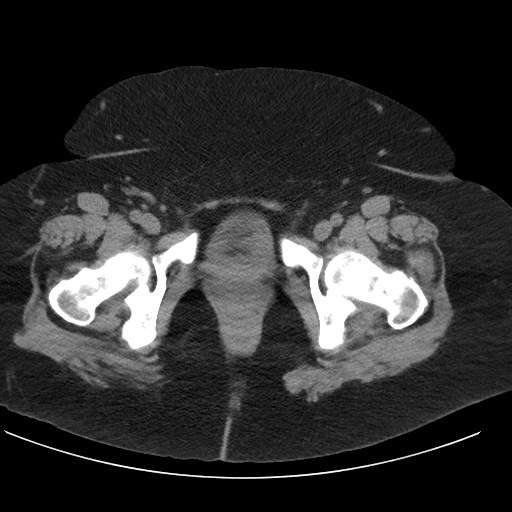
[im 13/99  bone]
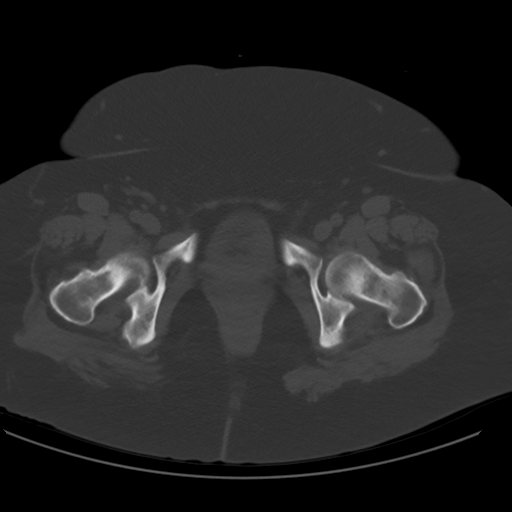
[im 29/99  soft-tissue]
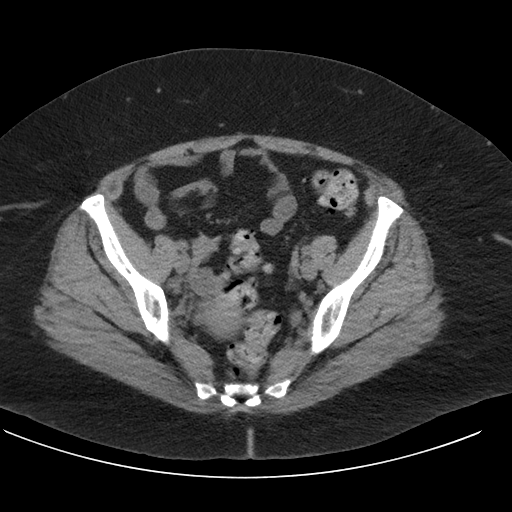
[im 50/99  soft-tissue]
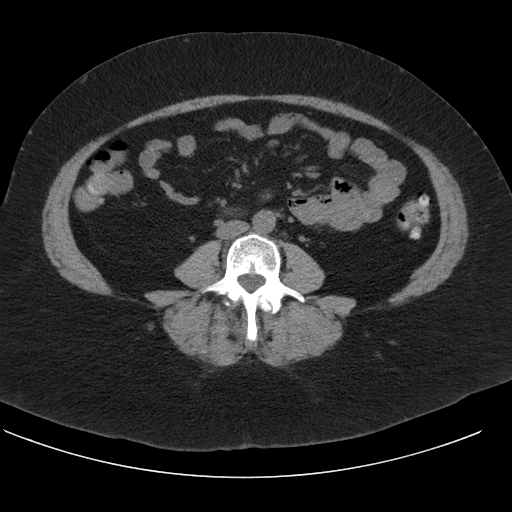
[im 70/99  soft-tissue]
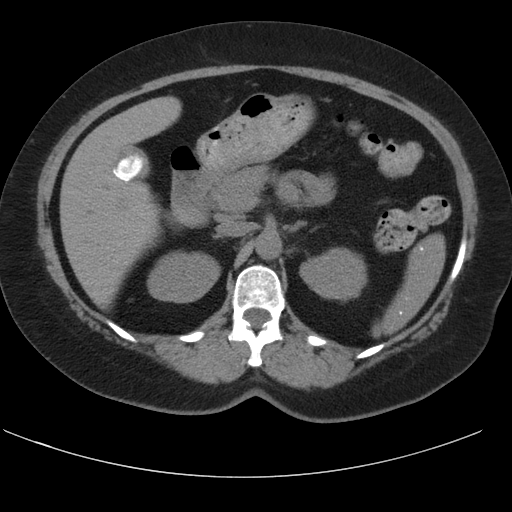
[im 86/99  soft-tissue]
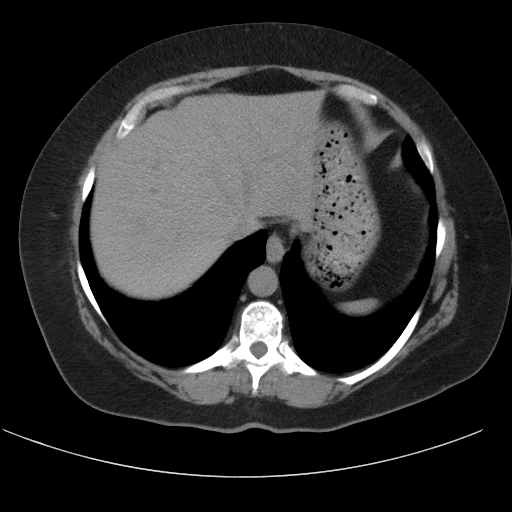

[Series 203: coronals, idose (2) · coronal · 0.45mm/px · 3 of 151 slices shown]
[im 51/151  soft-tissue]
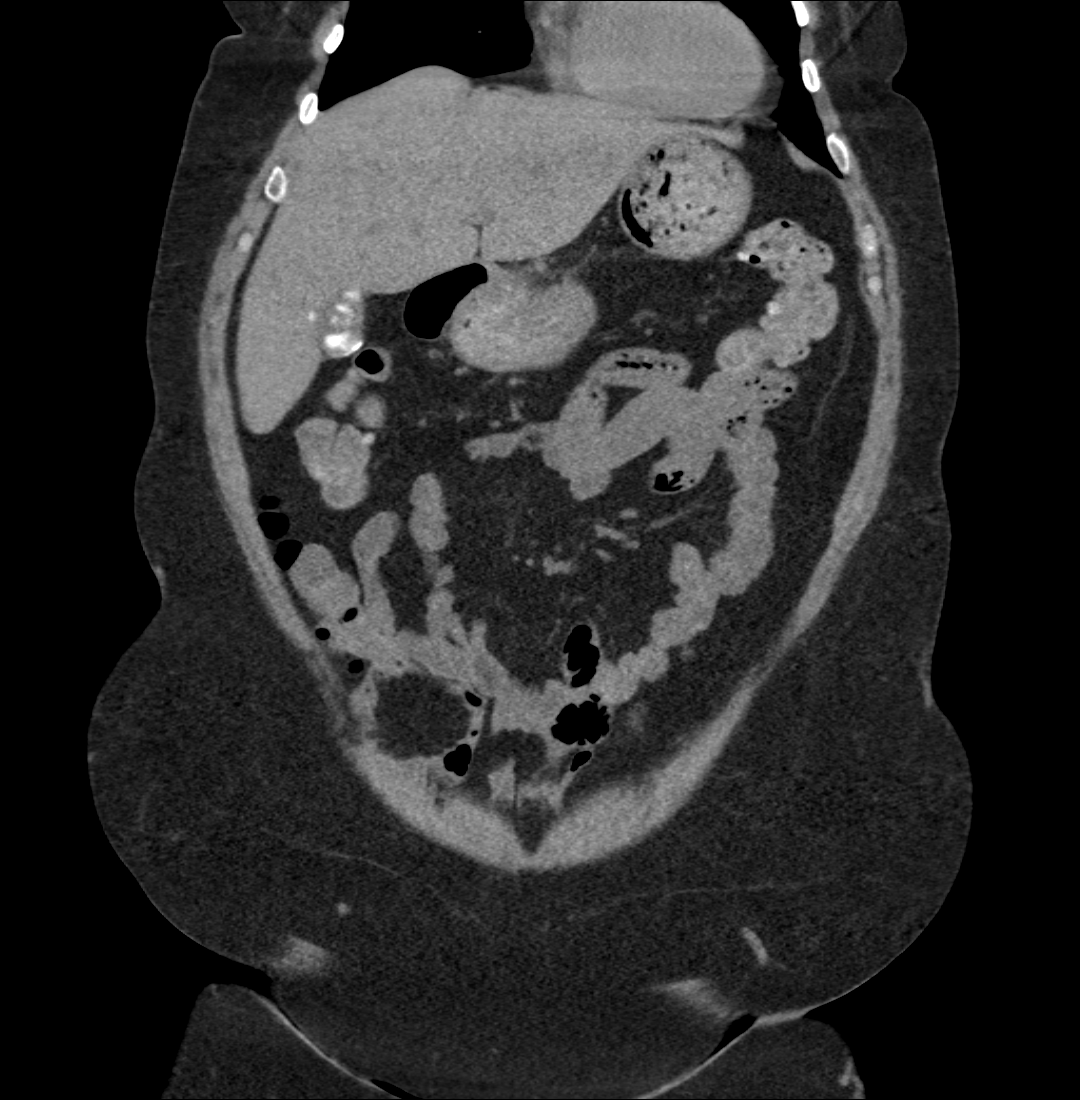
[im 67/151  soft-tissue]
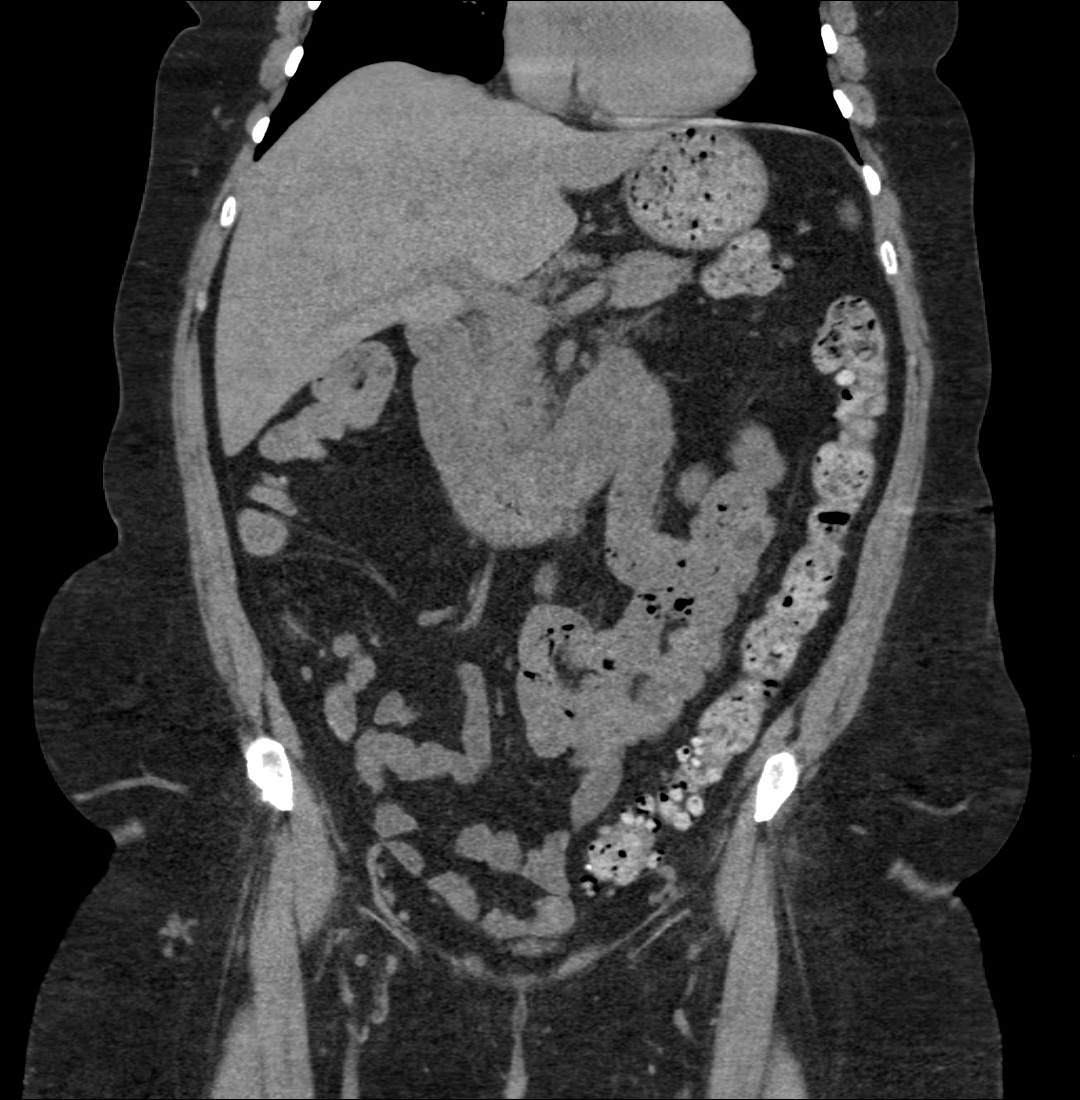
[im 84/151  soft-tissue]
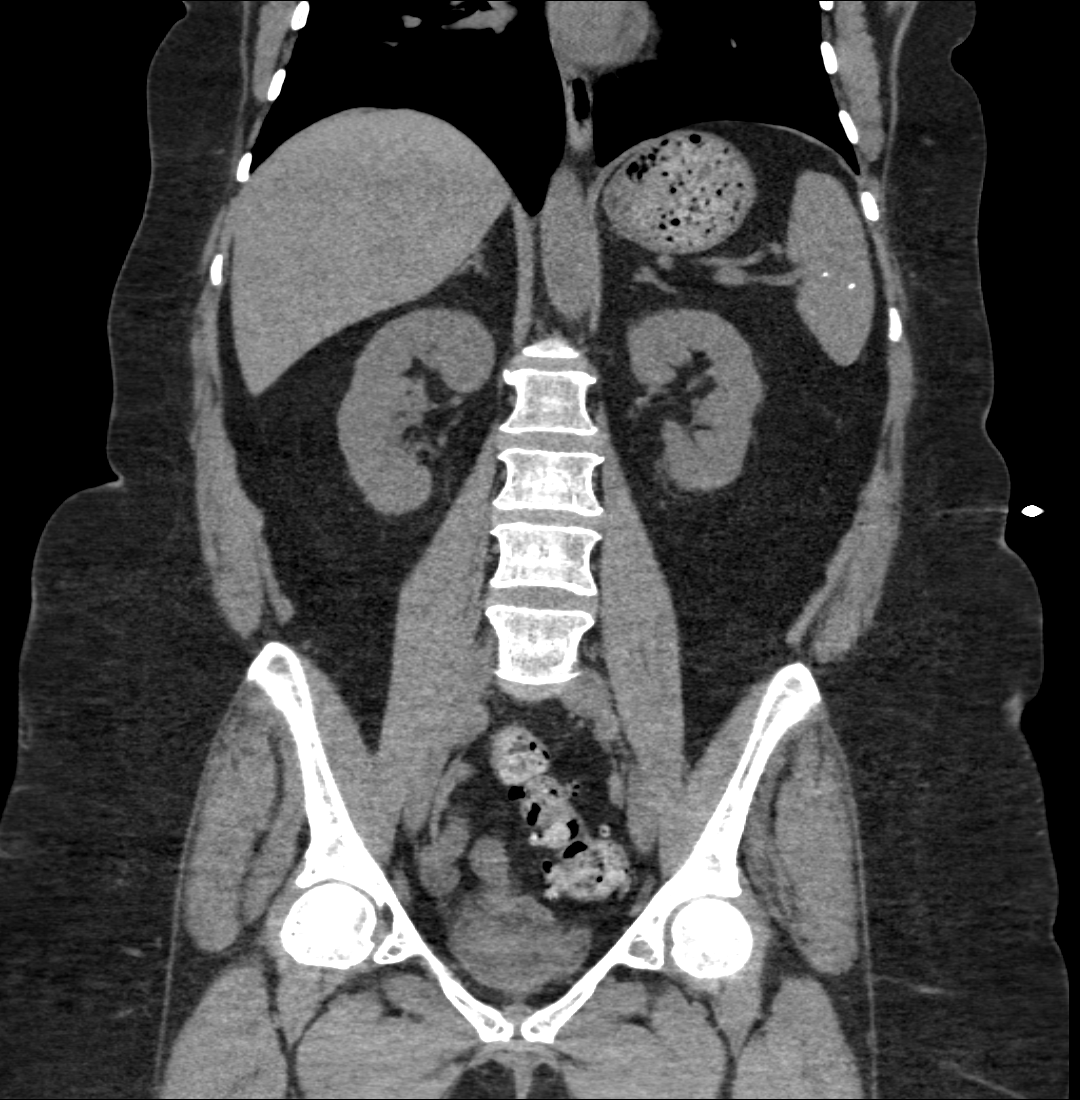

[8 of 46 positions shown; findings below may reference images not displayed]

FINDINGS: The lung bases are clear. The liver is unremarkable in the
unenhanced hand state other than several calcified granulomas from
prior granulomatous disease. Also calcified splenic granulomas are
noted. Multiple gallstones with peripheral calcification fill the
gallbladder with the largest stone of 2.4 cm in diameter. No
gallbladder wall thickening is seen. There is mottled calcification
near the neck of the pancreas but on the coronal images is appears
to be just above the pancreas and may be vascular or a calcified
node view the pancreas is otherwise normal in size and configuration
and the pancreatic duct is not dilated. The adrenal glands are
unremarkable and the spleen is normal in size. The stomach is
moderately distended with food debris and some contrast with no
definite abnormality noted no renal calculi are seen and there is no
evidence of hydronephrosis. Pyelonephritis cannot be evaluated on
this unenhanced study. Mild atherosclerotic disease of the abdominal
aorta is notable noted without aneurysm.

The uterus is normal in size. No adnexal lesion is seen. No free
fluid is noted within the pelvis. The urinary bladder is
decompressed and cannot be evaluated. Multiple rectosigmoid colon
diverticula are present with mild diverticulosis. Diverticula also
are scattered throughout the entire colon. No diverticulitis is
seen. The terminal ileum and the appendix are unremarkable
degenerative disc disease is present at L5-S1.
IMPRESSION: 1. No explanation for the patient's low back pain is seen. No renal
or ureteral calculi are noted and there is no evidence of
hydronephrosis.
2. Multiple gallstones filling the gallbladder the largest of
cm. No CT evidence of acute cholecystitis is seen.
3. Multiple colonic diverticula primarily concentrated within the
rectosigmoid colon. No diverticulitis.
4. Degenerative disc disease at L5-S1.
5. Changes of prior granulomatous disease with calcified hepatic and
splenic granulomas and a possible calcified node next to the
pancreas.

## 2016-04-01 MED FILL — FLUoxetine HCL 20 MG CAPS: 20 | 30 days supply | Qty: 90 | Fill #0

## 2016-04-05 ENCOUNTER — Other Ambulatory Visit: Payer: Self-pay | Admitting: Family Medicine

## 2016-04-05 DIAGNOSIS — K8070 Calculus of gallbladder and bile duct without cholecystitis without obstruction: Secondary | ICD-10-CM

## 2016-04-05 DIAGNOSIS — R109 Unspecified abdominal pain: Secondary | ICD-10-CM

## 2016-04-05 DIAGNOSIS — R140 Abdominal distension (gaseous): Secondary | ICD-10-CM

## 2016-04-05 DIAGNOSIS — R1013 Epigastric pain: Secondary | ICD-10-CM

## 2016-04-05 MED FILL — PANTOPRAZOLE SOD DR 40 MG T: 40 | 90 days supply | Qty: 90 | Fill #0

## 2016-04-05 MED FILL — rOPINIRole HCL 1 MG TABS: 1 | 30 days supply | Qty: 30 | Fill #1

## 2016-04-05 MED FILL — traMADol HCL 50 MG TABS: 50 | 20 days supply | Qty: 60 | Fill #1

## 2016-04-05 MED FILL — ASPIR-LOW EC 81 MG TABLET: 81 | 90 days supply | Qty: 90 | Fill #3

## 2016-04-11 MED FILL — BUPROPION HCL SR 150 MG TAB: 150 | 30 days supply | Qty: 60 | Fill #2

## 2016-04-22 ENCOUNTER — Ambulatory Visit
Admission: RE | Admit: 2016-04-22 | Discharge: 2016-04-22 | Disposition: A | Discharge status: Home / Self Care | Payer: 59 | Source: Ambulatory Visit | Attending: Family Medicine | Admitting: Family Medicine

## 2016-04-22 DIAGNOSIS — Z1231 Encounter for screening mammogram for malignant neoplasm of breast: Secondary | ICD-10-CM

## 2016-04-27 ENCOUNTER — Ambulatory Visit: Payer: 59 | Admitting: Student

## 2016-04-28 MED FILL — FLUoxetine HCL 20 MG CAPS: 20 | 30 days supply | Qty: 90 | Fill #1

## 2016-04-28 MED FILL — DICLOFENAC SODIUM 1% GEL: 1 | 10 days supply | Qty: 100 | Fill #2

## 2016-04-29 ENCOUNTER — Encounter: Payer: Self-pay | Admitting: Sports Medicine

## 2016-04-29 ENCOUNTER — Ambulatory Visit (INDEPENDENT_AMBULATORY_CARE_PROVIDER_SITE_OTHER): Payer: 59 | Admitting: Sports Medicine

## 2016-04-29 ENCOUNTER — Ambulatory Visit (INDEPENDENT_AMBULATORY_CARE_PROVIDER_SITE_OTHER): Payer: 59 | Admitting: Family Medicine

## 2016-04-29 DIAGNOSIS — M25561 Pain in right knee: Secondary | ICD-10-CM

## 2016-04-29 MED ORDER — METHYLPREDNISOLONE ACETATE 40 MG/ML IJ SUSP
40.0000 mg | Freq: Once | INTRAMUSCULAR | Status: AC
  Administered 2016-04-29: 40 mg via INTRA_ARTICULAR

## 2016-04-29 NOTE — Patient Instructions (Signed)
[facility] (Dr. Damita Dunnings office) will be calling you today or next week to set you up with your knee injections AFTER they review your UMR coverage. Their office number is [phone]

## 2016-04-29 NOTE — Addendum Note (Signed)
Addended by: Londell Moh T on: 04/29/2016 05:11 PM   Modules accepted: Orders

## 2016-04-29 NOTE — Assessment & Plan Note (Signed)
Steroid injection of right knee completed.  See procedure note.  Patient encouraged to follow up with Orthopedics for visco supplementation and discussion on knee replacement.

## 2016-04-29 NOTE — Assessment & Plan Note (Signed)
Her symptoms are most likely related to her end-stage osteoarthritis. X-rays from 2 years ago are showing severe osteoarthritis. She most likely has components of patellofemoral syndrome as well. Has tried steroid injections in the past with minimal improvement. - Referral to orthopedics for viscous supplementation - She can also have the discussion about possible right knee replacement. - Follow-up when necessary

## 2016-04-29 NOTE — Progress Notes (Signed)
  Sheryl Cooper - 55 y.o. female MRN [id number]  Date of birth: July 08, 1961  SUBJECTIVE:  Including CC & ROS.  Chief Complaint  Patient presents with  . Knee Pain     Sheryl Cooper is a 55 year old female that is presenting with bilateral knee pain. Her right knee pain is worse than her left knee pain. She reports that this pain is acute on chronic. It has been getting worse and constant over the past 3 weeks. She takes mobic on a daily occurrence is no improvement. She takes tramadol if her pain is severe but has not taken it recently. The pain is worse at the end of the day. She has tried a knee brace before with no improvement. She also uses Voltaren gel with minimal relief. She denies any recent injury and has had no prior surgeries on her knees. She reports some radiation proximally of the pain. She notes that both of her knees swell. She has had a steroid injection performed a couple of years ago with minimal to no improvement. She has not tried any viscous supplementation.  ROS: No unexpected weight loss, fever, chills, swelling, instability, muscle pain, numbness/tingling, redness, otherwise see HPI    HISTORY: Past Medical, Surgical, Social, and Family History Reviewed & Updated per EMR.   Pertinent Historical Findings include: PMSHx -  anxiety and depression, PTSD, hypertension, morbid obesity PSHx -  former smoker Medications -Prozac, losartan, mobic, tramadol, Wellbutrin  DATA REVIEWED: 11/05/13: left and right knee x-ray: Severe medial and patellofemoral compartment joint space narrowing, much worse than the left knee. No effusion   PHYSICAL EXAM:  VS: BP:(!) 118/57  HR:75bpm  TEMP: ( )  RESP:   HT:5\' 5"  (165.1 cm)   WT:234 lb (106.1 kg)  BMI:39 PHYSICAL EXAM: Gen: NAD, alert, cooperative with exam, obese HEENT: clear conjunctiva, EOMI CV:  no edema, capillary refill brisk,  Resp: non-labored, normal speech Skin: no rashes, normal turgor  Neuro: no gross deficits.    Psych:  alert and oriented Right Knee: Normal to inspection with no erythema or obvious bony abnormalities. Palpation normal with no warmth, patellar tenderness, or condyle tenderness. Some pain to palpation over the medial lateral joint line of the right knee. ROM full in flexion and extension and lower leg rotation. Ligaments with solid consistent endpoints including LCL, MCL. Negative Mcmurray's Non painful patellar compression. Patellar glide with crepitus. Patellar and quadriceps tendons unremarkable. Hamstring and quadriceps strength is normal.  Neurovascularly intact   ASSESSMENT & PLAN:   Right knee pain Her symptoms are most likely related to her end-stage osteoarthritis. X-rays from 2 years ago are showing severe osteoarthritis. She most likely has components of patellofemoral syndrome as well. Has tried steroid injections in the past with minimal improvement. - Referral to orthopedics for viscous supplementation - She can also have the discussion about possible right knee replacement. - Follow-up when necessary

## 2016-04-29 NOTE — Progress Notes (Signed)
   Subjective:    Patient ID: Sheryl Cooper, female    DOB: Dec 18, 1960, 55 y.o.   MRN: [id number]  HPI 55 y/o female presents for "steroid injection" of right knee.  Right knee pain Seen at Sports medicine today. Knee pain thought to be related to OA. Referred to Orthopedics for possible visco supplementation and discussion on knee replacement. Patient would like steroid injection to treat acute pain.    Review of Systems     Objective:   Physical Exam  Procedure Note: Knee Injection Risks and Benefits of the procedure discussed with patient. Verbal and Written consent obtained. Knee prepped in sterile fashion. 40 cc DepoMedrol and 4 cc 1% Lidocaine without Epinephrine was injected using a medial approach. Patient tolerated well. No complications. Bandage applied.       Assessment & Plan:  Right knee pain Steroid injection of right knee completed.  See procedure note.  Patient encouraged to follow up with Orthopedics for visco supplementation and discussion on knee replacement.

## 2016-05-03 MED FILL — rOPINIRole HCL 1 MG TABS: 1 | 30 days supply | Qty: 30 | Fill #2

## 2016-05-13 MED FILL — BUPROPION SR 150 MG TABLET: 150 | 30 days supply | Qty: 60 | Fill #3

## 2016-05-16 ENCOUNTER — Other Ambulatory Visit: Payer: Self-pay | Admitting: Family Medicine

## 2016-05-16 MED FILL — LOSARTAN POTASSIUM 50 MG TA: 50 | 90 days supply | Qty: 90 | Fill #0

## 2016-05-16 MED FILL — HYDROCHLOROTHIAZIDE 25 MG T: 25 | 90 days supply | Qty: 90 | Fill #0

## 2016-05-30 ENCOUNTER — Other Ambulatory Visit: Payer: Self-pay | Admitting: *Deleted

## 2016-05-30 MED ORDER — TRAMADOL HCL 50 MG PO TABS: 50.0000 mg | ORAL_TABLET | Freq: Three times a day (TID) | ORAL | 1 refills | Status: DC | PRN

## 2016-05-30 MED ORDER — MELOXICAM 15 MG PO TABS: 15.0000 mg | ORAL_TABLET | Freq: Every day | ORAL | 1 refills | Status: DC

## 2016-05-30 MED FILL — MELOXICAM 15 MG TABLET: 15 | 90 days supply | Qty: 90 | Fill #0

## 2016-05-30 NOTE — Telephone Encounter (Signed)
Please call in Tramadol 50 mg Q8 hours prn pain, dispense # 60, refill #1, thanks.

## 2016-05-31 MED FILL — traMADol HCL 50 MG TABS: 50 | 20 days supply | Qty: 60 | Fill #0

## 2016-05-31 NOTE — Telephone Encounter (Signed)
Rx called into patient pharmacy. 

## 2016-06-01 ENCOUNTER — Ambulatory Visit: Payer: 59 | Admitting: Advanced Practice Midwife

## 2016-06-02 MED FILL — rOPINIRole HCL 1 MG TABS: 1 | 30 days supply | Qty: 30 | Fill #3

## 2016-06-03 ENCOUNTER — Other Ambulatory Visit: Payer: Self-pay | Admitting: Family Medicine

## 2016-06-03 MED ORDER — FLUOXETINE HCL 20 MG PO TABS: 60.0000 mg | ORAL_TABLET | Freq: Every day | ORAL | 2 refills | Status: DC

## 2016-06-03 MED FILL — FLUoxetine HCL 20 MG CAPS: 20 | 30 days supply | Qty: 90 | Fill #0

## 2016-06-11 DIAGNOSIS — H5213 Myopia, bilateral: Secondary | ICD-10-CM | POA: Diagnosis not present

## 2016-06-11 DIAGNOSIS — H524 Presbyopia: Secondary | ICD-10-CM | POA: Diagnosis not present

## 2016-06-11 DIAGNOSIS — H52223 Regular astigmatism, bilateral: Secondary | ICD-10-CM | POA: Diagnosis not present

## 2016-06-14 MED FILL — BUPROPION SR 150 MG TABLET: 150 | 30 days supply | Qty: 60 | Fill #4

## 2016-07-01 MED FILL — rOPINIRole HCL 1 MG TABS: 1 | 30 days supply | Qty: 30 | Fill #4

## 2016-07-01 MED FILL — FLUoxetine HCL 20 MG CAPS: 20 | 30 days supply | Qty: 90 | Fill #1

## 2016-07-04 ENCOUNTER — Encounter (INDEPENDENT_AMBULATORY_CARE_PROVIDER_SITE_OTHER): Payer: 59 | Admitting: Family Medicine

## 2016-07-07 ENCOUNTER — Other Ambulatory Visit: Payer: Self-pay | Admitting: Family Medicine

## 2016-07-07 MED FILL — PANTOPRAZOLE SOD DR 40 MG T: 40 | 90 days supply | Qty: 90 | Fill #1

## 2016-07-07 MED FILL — ASPIR-LOW EC 81 MG TABLET: 81 | 90 days supply | Qty: 90 | Fill #0

## 2016-07-15 MED FILL — BUPROPION SR 150 MG TABLET: 150 | 30 days supply | Qty: 60 | Fill #5

## 2016-07-19 ENCOUNTER — Ambulatory Visit (INDEPENDENT_AMBULATORY_CARE_PROVIDER_SITE_OTHER): Payer: 59 | Admitting: Family Medicine

## 2016-07-28 ENCOUNTER — Telehealth: Payer: Self-pay | Admitting: *Deleted

## 2016-07-28 ENCOUNTER — Encounter (INDEPENDENT_AMBULATORY_CARE_PROVIDER_SITE_OTHER): Payer: 59 | Admitting: Family Medicine

## 2016-07-28 ENCOUNTER — Encounter (INDEPENDENT_AMBULATORY_CARE_PROVIDER_SITE_OTHER): Payer: Self-pay

## 2016-07-28 ENCOUNTER — Other Ambulatory Visit: Payer: Self-pay | Admitting: Family Medicine

## 2016-07-28 MED ORDER — ROPINIROLE HCL 1 MG PO TABS: 1.0000 mg | ORAL_TABLET | Freq: Every day | ORAL | 1 refills | Status: DC

## 2016-07-28 MED ORDER — BENZONATATE 100 MG PO CAPS: 100.0000 mg | ORAL_CAPSULE | Freq: Two times a day (BID) | ORAL | 1 refills | Status: DC | PRN

## 2016-07-28 MED FILL — rOPINIRole HCL 1 MG TABS: 1 | 90 days supply | Qty: 90 | Fill #0

## 2016-07-28 MED FILL — BENZONATATE 100 MG CAPSULE: 100 | 10 days supply | Qty: 30 | Fill #0

## 2016-07-28 NOTE — Telephone Encounter (Signed)
Patient stated medication was sent to wrong pharmacy.  Medication resent to [place].  Derl Barrow, RN

## 2016-07-29 ENCOUNTER — Telehealth: Payer: Self-pay | Admitting: Family Medicine

## 2016-08-03 MED FILL — FLUoxetine HCL 20 MG CAPS: 20 | 30 days supply | Qty: 90 | Fill #2

## 2016-08-09 ENCOUNTER — Other Ambulatory Visit: Payer: Self-pay | Admitting: Family Medicine

## 2016-08-09 MED ORDER — GUAIFENESIN-CODEINE 100-10 MG/5ML PO SOLN: 5.0000 mL | Freq: Four times a day (QID) | ORAL | 0 refills | Status: DC | PRN

## 2016-08-09 NOTE — Telephone Encounter (Signed)
Spoke to patient in office. She reports continued cough/sputum over the past few weeks despite multiple otc cough medications. Lungs clear on ausculation. Sent in prescription for guifenesin and codeine.

## 2016-08-10 ENCOUNTER — Other Ambulatory Visit: Payer: Self-pay | Admitting: Family Medicine

## 2016-08-10 MED ORDER — GUAIFENESIN-CODEINE 100-10 MG/5ML PO SOLN: 5.0000 mL | Freq: Four times a day (QID) | ORAL | 0 refills | Status: DC | PRN

## 2016-08-10 NOTE — Progress Notes (Signed)
   Subjective:    Patient ID: Sheryl Cooper, female    DOB: Sep 24, 1960, 56 y.o.   MRN: [id number]  HPI 56 y/o female presents for follow up of right knee pain.   Right Knee OA Last seen in office September 2017, received steroid injection and patient reports improvement of pain/function. Pain currently 8/10, "not unbearable", worse with lots of walking, taking Mobic 15 mg and using Voltaren gel prn  Seen by Sports Medicine in September. Referred to [facility] (Dr. Mayer Camel) for Visco-supplemenation. Patient reports that she was not contacted about an appointment.    Anxiety Mother has been sick and passed away, reports stress related to this, feels like she doesn't have a great support system, would like to schedule appointment with psychology student/integrated care, takes Wellbutrin 300 mg daily and Prozac 60 mg daily, no thoughts of harming self  HM Due for Pap Smear (wants to have completed at [facility] hospital).   Social Recently moved into own home, non-smoker; has not been exercising regularly due to above stressors   Review of Systems  Constitutional: Negative for chills, fatigue and fever.  Respiratory: Negative for chest tightness and shortness of breath.   Cardiovascular: Negative for chest pain.  Gastrointestinal: Negative for diarrhea, nausea and vomiting.       Objective:   Physical Exam BP 124/62   Pulse 95   Temp 97.8 F (36.6 C) (Oral)   Wt 249 lb 9.6 oz (113.2 kg)   LMP 08/17/2012   BMI 41.54 kg/m   Gen: pleasant female, NAD Psych: well groomed, affect outgoing, tearful at times, mood is depressed, no SI, no HI MSK: right knee - no swelling, no erythema, crepitus with flexion/extension, tenderness over both medial and lateral joint space, no baker's cyst, good endpoint with lachman/varus stress/valgus stress, McMurry negative  PHQ 9 score of 18 (somewhat difficult) GAD 7 score of 13 (somwhat difficult)      Assessment & Plan:  Anxiety and  depression Increased Depression and Anxiety due to recent loss of mother. PHQ 9 score of 18. GAD 7 score of 13. -will arrange appointment with [facility] psychology student in office -continue Wellbutrin and Prozac  Right knee pain Stable right knee pain. Patient interested in [place] -referral to orthopedics -continue Mobic and Voltaren gel

## 2016-08-11 ENCOUNTER — Ambulatory Visit (INDEPENDENT_AMBULATORY_CARE_PROVIDER_SITE_OTHER): Payer: 59 | Admitting: Family Medicine

## 2016-08-11 ENCOUNTER — Encounter: Payer: Self-pay | Admitting: Family Medicine

## 2016-08-11 DIAGNOSIS — F329 Major depressive disorder, single episode, unspecified: Secondary | ICD-10-CM

## 2016-08-11 DIAGNOSIS — G8929 Other chronic pain: Secondary | ICD-10-CM | POA: Diagnosis not present

## 2016-08-11 DIAGNOSIS — F418 Other specified anxiety disorders: Secondary | ICD-10-CM | POA: Diagnosis not present

## 2016-08-11 DIAGNOSIS — F32A Depression, unspecified: Secondary | ICD-10-CM

## 2016-08-11 DIAGNOSIS — M25561 Pain in right knee: Secondary | ICD-10-CM

## 2016-08-11 DIAGNOSIS — F419 Anxiety disorder, unspecified: Secondary | ICD-10-CM

## 2016-08-11 MED FILL — VENTOLIN HFA 90 MCG INHALER: 108 (90 BAS | 25 days supply | Qty: 18 | Fill #1

## 2016-08-11 NOTE — Assessment & Plan Note (Signed)
Stable right knee pain. Patient interested in [place] -referral to orthopedics -continue Mobic and Voltaren gel

## 2016-08-11 NOTE — Assessment & Plan Note (Signed)
Increased Depression and Anxiety due to recent loss of mother. PHQ 9 score of 18. GAD 7 score of 13. -will arrange appointment with [facility] psychology student in office -continue Wellbutrin and Prozac

## 2016-08-11 NOTE — Patient Instructions (Signed)
Knee Pain - referral to orthopedics for gel injections, continue Mobic and Voltaren  I will reach out to one of the psychology students to arrange counseling.

## 2016-08-15 ENCOUNTER — Other Ambulatory Visit: Payer: Self-pay | Admitting: Family Medicine

## 2016-08-15 MED FILL — BUPROPION SR 150 MG TABLET: 150 | 30 days supply | Qty: 60 | Fill #0

## 2016-08-15 MED FILL — HYDROCHLOROTHIAZIDE 25 MG T: 25 | 90 days supply | Qty: 90 | Fill #1

## 2016-08-15 MED FILL — BENZONATATE 100 MG CAPSULE: 100 | 10 days supply | Qty: 30 | Fill #1

## 2016-08-15 MED FILL — LOSARTAN POTASSIUM 50 MG TA: 50 | 90 days supply | Qty: 90 | Fill #1

## 2016-08-16 ENCOUNTER — Telehealth: Payer: Self-pay

## 2016-08-16 NOTE — Telephone Encounter (Signed)
Phone rang and then gave busy signal, could not leave msg

## 2016-08-18 ENCOUNTER — Ambulatory Visit (INDEPENDENT_AMBULATORY_CARE_PROVIDER_SITE_OTHER): Payer: 59 | Admitting: Family Medicine

## 2016-08-18 ENCOUNTER — Encounter (INDEPENDENT_AMBULATORY_CARE_PROVIDER_SITE_OTHER): Payer: Self-pay

## 2016-08-22 ENCOUNTER — Encounter (INDEPENDENT_AMBULATORY_CARE_PROVIDER_SITE_OTHER): Payer: Self-pay

## 2016-08-26 MED FILL — MELOXICAM 15 MG TABLET: 15 | 90 days supply | Qty: 90 | Fill #1

## 2016-08-30 ENCOUNTER — Telehealth: Payer: Self-pay

## 2016-08-30 NOTE — Telephone Encounter (Signed)
No answer, no voicemail so could not leave message

## 2016-08-31 ENCOUNTER — Other Ambulatory Visit: Payer: Self-pay | Admitting: Family Medicine

## 2016-08-31 MED FILL — FLUoxetine HCL 20 MG CAPS: 20 | 30 days supply | Qty: 90 | Fill #0

## 2016-09-06 ENCOUNTER — Ambulatory Visit (INDEPENDENT_AMBULATORY_CARE_PROVIDER_SITE_OTHER): Payer: 59 | Admitting: Family Medicine

## 2016-09-06 ENCOUNTER — Encounter (INDEPENDENT_AMBULATORY_CARE_PROVIDER_SITE_OTHER): Payer: Self-pay

## 2016-09-16 MED FILL — BUPROPION SR 150 MG TABLET: 150 | 30 days supply | Qty: 60 | Fill #1

## 2016-09-22 ENCOUNTER — Encounter (INDEPENDENT_AMBULATORY_CARE_PROVIDER_SITE_OTHER): Payer: 59 | Admitting: Family Medicine

## 2016-10-03 MED FILL — FLUoxetine HCL 20 MG CAPS: 20 | 30 days supply | Qty: 90 | Fill #1

## 2016-10-05 NOTE — Progress Notes (Signed)
   Subjective:    Patient ID: Sheryl Cooper, female    DOB: [birth year], 56 y.o.   MRN: [id number]  HPI 56 y/o female presents for routine follow up.  Depression Currently taking Prozac 60 mg daily and Wellbutrin SR 150 mg BID, mood was better a few weeks ago but has worsened over the past week. Frequent thoughts of mother (plans to bury in may). Multiple family members stating that they can't come to burial. Thoughts of whether family would come to her funeral. 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 Did not follow up with Integrated Care in [facility].   Obesity Overeating due to depressed mood, has not been exercising regularly, wants to get back on track, previously on Phentermine, would like something for weight control.   Knee Pain Bilateral (R>L), currently taking Mobic 15 mg daily and Voltaren gel, last steroid injection was in the right knee 04/2016, referred to Orthopedics at last visit for Visco supplementation (states she never received the referral). Not interested in an injection at this time.   HTN On losartan and HCTZ, tolerating well, no chest pain    HM Due for Pap Smear (plans to make an appointment with GYN in near future).  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 Will be fileing for bankruptcy soon (she is ok with this as it will help her to get out from under her debt).    Review of Systems  Constitutional: Negative for chills, fatigue and fever.  Respiratory: Negative for shortness of breath.   Cardiovascular: Negative for chest pain.  Gastrointestinal: Negative for diarrhea and vomiting.       Objective:   Physical Exam BP 138/70   Pulse 74   Temp 97.9 F (36.6 C) (Oral)   Ht 5\' 5"  (1.651 m)   Wt 260 lb 12.8 oz (118.3 kg)   LMP 08/17/2012   SpO2 95%   BMI 43.40 kg/m    Gen:  pleasant female, NAD Cardiac: RRR, S1 and S2 present, no murmur Resp: CTAB, normal effort MSK: Right Knee - no swelling, no redness, no crepitus with flexion, diffuse joint line tenderness  PHQ 9 score of 14 (not difficult) - highest scores of overeating and not feeling good about self.  GAD 7 score of 15 (not difficult)      Assessment & Plan:  HTN (hypertension) At goal of <140/90 -continue Losartan and HCTZ  Anxiety and depression Stable. Little improvement from last visit. PHQ 9 score of 14 and GAD 7 score of 15. No SI.  -encouraged regular exercise, increase socialization (recently joined [employer]) -continue Wellbutrin and Prozac -encouraged follow up with Integrated Healthcare Team  KNEE PAIN, BILATERAL Bilateral knee pain (R>L) likely exacerbated by weight gain.  -encouraged weight loss -continue Mobic and Voltaren gel -consider visco supplementation in the future  Morbid obesity ([place]) Weight stable however still severely overweight.  -encouraged patient to restart healthy eating habits and start to exercise regularly -started Saxenda 0.6 mg daily -return in 2 week for weight check

## 2016-10-07 ENCOUNTER — Ambulatory Visit (INDEPENDENT_AMBULATORY_CARE_PROVIDER_SITE_OTHER): Payer: 59 | Admitting: Family Medicine

## 2016-10-07 ENCOUNTER — Telehealth: Payer: Self-pay | Admitting: *Deleted

## 2016-10-07 ENCOUNTER — Encounter: Payer: Self-pay | Admitting: Family Medicine

## 2016-10-07 ENCOUNTER — Other Ambulatory Visit: Payer: Self-pay | Admitting: *Deleted

## 2016-10-07 DIAGNOSIS — M25569 Pain in unspecified knee: Secondary | ICD-10-CM

## 2016-10-07 DIAGNOSIS — F32A Depression, unspecified: Secondary | ICD-10-CM

## 2016-10-07 DIAGNOSIS — R109 Unspecified abdominal pain: Secondary | ICD-10-CM

## 2016-10-07 DIAGNOSIS — I10 Essential (primary) hypertension: Secondary | ICD-10-CM | POA: Diagnosis not present

## 2016-10-07 DIAGNOSIS — R1013 Epigastric pain: Secondary | ICD-10-CM

## 2016-10-07 DIAGNOSIS — F419 Anxiety disorder, unspecified: Secondary | ICD-10-CM

## 2016-10-07 DIAGNOSIS — F418 Other specified anxiety disorders: Secondary | ICD-10-CM | POA: Diagnosis not present

## 2016-10-07 DIAGNOSIS — K8070 Calculus of gallbladder and bile duct without cholecystitis without obstruction: Secondary | ICD-10-CM

## 2016-10-07 DIAGNOSIS — R140 Abdominal distension (gaseous): Secondary | ICD-10-CM

## 2016-10-07 DIAGNOSIS — F329 Major depressive disorder, single episode, unspecified: Secondary | ICD-10-CM

## 2016-10-07 MED ORDER — PANTOPRAZOLE SODIUM 40 MG PO TBEC: DELAYED_RELEASE_TABLET | ORAL | 1 refills | Status: DC

## 2016-10-07 MED ORDER — DICLOFENAC SODIUM 1 % TD GEL: 4.0000 g | Freq: Four times a day (QID) | TRANSDERMAL | 2 refills | Status: DC

## 2016-10-07 MED ORDER — LIRAGLUTIDE -WEIGHT MANAGEMENT 18 MG/3ML ~~LOC~~ SOPN: 0.6000 mg | PEN_INJECTOR | Freq: Every morning | SUBCUTANEOUS | 2 refills | Status: DC

## 2016-10-07 MED ORDER — INSULIN PEN NEEDLE 32G X 4 MM MISC: 1 refills | Status: DC

## 2016-10-07 MED FILL — DICLOFENAC SODIUM 1% GEL: 1 | 6 days supply | Qty: 100 | Fill #0

## 2016-10-07 MED FILL — ASPIR-LOW EC 81 MG TABLET: 81 | 90 days supply | Qty: 90 | Fill #1

## 2016-10-07 MED FILL — PANTOPRAZOLE SOD DR 40 MG T: 40 | 90 days supply | Qty: 90 | Fill #0

## 2016-10-07 NOTE — Assessment & Plan Note (Signed)
Weight stable however still severely overweight.  -encouraged patient to restart healthy eating habits and start to exercise regularly -started Saxenda 0.6 mg daily -return in 2 week for weight check

## 2016-10-07 NOTE — Assessment & Plan Note (Signed)
Bilateral knee pain (R>L) likely exacerbated by weight gain.  -encouraged weight loss -continue Mobic and Voltaren gel -consider visco supplementation in the future

## 2016-10-07 NOTE — Assessment & Plan Note (Signed)
Stable. Little improvement from last visit. PHQ 9 score of 14 and GAD 7 score of 15. No SI.  -encouraged regular exercise, increase socialization (recently joined [employer]) -continue Wellbutrin and Prozac -encouraged follow up with Integrated Healthcare Team

## 2016-10-07 NOTE — Assessment & Plan Note (Signed)
At goal of <140/90 -continue Losartan and HCTZ

## 2016-10-07 NOTE — Telephone Encounter (Signed)
PA form for Saxenda faxed to [employer] for review. Review process could take 24-72 hours to complete.  Derl Barrow, RN

## 2016-10-07 NOTE — Telephone Encounter (Signed)
Prior Authorization received from [place] for Saxenda 18 mg/3 ml. Formulary and PA form placed in provider box for completion. Derl Barrow, RN

## 2016-10-07 NOTE — Patient Instructions (Signed)
It was nice to meet with you.  Overweight - please get back on track with you healthy eating habits, attempt to move/exercise 3 times per week. Start Saxenda - please bring the supplies to the office and I will show you how to inject it in the office.   Knee Pain - please let me know if you would like to go to the orthopedic doctor for the gel injection, continue Mobic and Voltaren gel.   Decreased mood - continue Prozac and Wellbutrin, please follow up with integrated behavior health  Return in 2 weeks.

## 2016-10-12 NOTE — Telephone Encounter (Signed)
Pt would like to know if medication has been approved or not. ep

## 2016-10-13 MED FILL — traMADol HCL 50 MG TABS: 50 | 20 days supply | Qty: 60 | Fill #1

## 2016-10-13 NOTE — Telephone Encounter (Signed)
I notified patient via email that I had not heard back regarding prior authorization. Sheryl Cooper have you heard anything?

## 2016-10-14 NOTE — Telephone Encounter (Signed)
Received fax from [place] requesting more information for PA Saxenda. PA questions completed and faxed back to [place].  Derl Barrow, RN

## 2016-10-14 NOTE — Telephone Encounter (Signed)
Should have a response from insurance today.  Derl Barrow, RN

## 2016-10-18 MED FILL — UNIFINE PENTIPS 32GX5/32: 32G X 4 MM | 90 days supply | Qty: 100 | Fill #0

## 2016-10-18 MED FILL — UNIFINE PENTIPS 32GX5/32": 32G X 4 MM | 90 days supply | Qty: 100 | Fill #0

## 2016-10-18 MED FILL — SAXENDA 18 MG/3 ML PEN: 18 | 30 days supply | Qty: 15 | Fill #0

## 2016-10-18 MED FILL — BUPROPION SR 150 MG TABLET: 150 | 30 days supply | Qty: 60 | Fill #2

## 2016-10-19 NOTE — Telephone Encounter (Signed)
PA was approved for Saxenda 3 mg/0.5 ml via [employer]. Valid dates 10/13/16-02/11/2017.  Derl Barrow, RN

## 2016-10-26 MED FILL — rOPINIRole HCL 1 MG TABS: 1 | 90 days supply | Qty: 90 | Fill #1

## 2016-11-02 MED FILL — FLUoxetine HCL 20 MG CAPS: 20 | 30 days supply | Qty: 90 | Fill #2

## 2016-11-09 ENCOUNTER — Other Ambulatory Visit: Payer: Self-pay | Admitting: Family Medicine

## 2016-11-09 MED ORDER — ONDANSETRON 4 MG PO TBDP: 4.0000 mg | ORAL_TABLET | Freq: Three times a day (TID) | ORAL | 0 refills | Status: DC | PRN

## 2016-11-09 MED FILL — ONDANSETRON ODT 4 MG TABLET: 4 | 7 days supply | Qty: 20 | Fill #0

## 2016-11-09 NOTE — Progress Notes (Signed)
Patient requested refill of Zofran as having Nausea with Saxenda.

## 2016-11-14 ENCOUNTER — Other Ambulatory Visit: Payer: Self-pay | Admitting: *Deleted

## 2016-11-14 MED ORDER — HYDROCHLOROTHIAZIDE 25 MG PO TABS: 25.0000 mg | ORAL_TABLET | Freq: Every day | ORAL | 1 refills | Status: DC

## 2016-11-14 MED ORDER — LOSARTAN POTASSIUM 50 MG PO TABS: 50.0000 mg | ORAL_TABLET | Freq: Every day | ORAL | 1 refills | Status: DC

## 2016-11-14 MED FILL — LOSARTAN POTASSIUM 50 MG TA: 50 | 90 days supply | Qty: 90 | Fill #0

## 2016-11-14 MED FILL — HYDROCHLOROTHIAZIDE 25 MG T: 25 | 90 days supply | Qty: 90 | Fill #0

## 2016-11-14 NOTE — Telephone Encounter (Signed)
Patient filled last script for losartan and HCTZ and will need refills for next month. Jazmin Hartsell,CMA

## 2016-11-16 MED FILL — BUPROPION SR 150 MG TABLET: 150 | 30 days supply | Qty: 60 | Fill #3

## 2016-11-22 ENCOUNTER — Other Ambulatory Visit: Payer: Self-pay | Admitting: *Deleted

## 2016-11-22 MED ORDER — LIRAGLUTIDE -WEIGHT MANAGEMENT 18 MG/3ML ~~LOC~~ SOPN: 0.6000 mg | PEN_INJECTOR | Freq: Every morning | SUBCUTANEOUS | 2 refills | Status: DC

## 2016-11-22 MED FILL — SAXENDA 18 MG/3 ML PEN: 18 | 30 days supply | Qty: 15 | Fill #0

## 2016-11-22 NOTE — Progress Notes (Addendum)
   Subjective:    Patient ID: Sheryl Cooper, female    DOB: [birth year], 58 y.o.   MRN: [id number]  HPI 56 y/o female presents for weight check.  Obesity Started on Saxenda on 10/07/16. Has titrated up to 3 mg daily. Tolerating well. Reports increased satiety, occasional GI upset and nausea. Does take Zofran occasionally (1-2 times per week as needed).   Tries to eat lunch each day, shake at dinner, does not always eat breakfast (sometimes oatmeal).  Exercise is limited at times due to knee pain, walking daily (up to one mile at a time).    Anxiety and Depression Prescribed Prozac 60 mg daily and Wellbutrin 150 mg BID, at last visit had worsening symptoms due to upcoming burial for mother. Malvin Johns is next week in [place]. Reports reading more, mood is improved. Feels like "something is gone" is a good way.  Still bowling once per week.   Restless Leg Controlled with Requip, notices increased symptoms if misses a dose.   HM Due for Pap Smear  Social Nonsmoker   Review of Systems  Constitutional: Negative for chills and fatigue.  Respiratory: Negative for shortness of breath and wheezing.   Cardiovascular: Negative for chest pain.  Gastrointestinal: Positive for nausea. Negative for diarrhea and vomiting.       Objective:   Physical Exam BP 118/68   Pulse 81   Temp 97.9 F (36.6 C) (Oral)   Ht 5\' 5"  (1.651 m)   Wt 242 lb (109.8 kg)   LMP 08/17/2012   SpO2 95%   BMI 40.27 kg/m  Weight on 10/07/16 of 260 Weight today is 242  Gen: pleasant female, NAD Cardiac: RRR, S1 and S2 present, no murmur Resp: CTAB, normal effort Psych: affect is outgoing, mood is stated as good, no SI, no HI      Assessment & Plan:  Anxiety and depression Improved from last visit.  -continue Prozac and Wellbutrin  Restless leg syndrome Controlled with Requip.   Morbid obesity ([place]) 18 pound weight loss from last visit. -continue Saxenda 3 mg daily (patient does have some GI side  effects, will monitor) -encouraged continued daily exercise  Encouraged patient to schedule Pap Smear with Dr. Nori Riis

## 2016-11-24 ENCOUNTER — Ambulatory Visit (INDEPENDENT_AMBULATORY_CARE_PROVIDER_SITE_OTHER): Payer: 59 | Admitting: Family Medicine

## 2016-11-24 ENCOUNTER — Encounter: Payer: Self-pay | Admitting: Family Medicine

## 2016-11-24 DIAGNOSIS — F32A Depression, unspecified: Secondary | ICD-10-CM

## 2016-11-24 DIAGNOSIS — F419 Anxiety disorder, unspecified: Secondary | ICD-10-CM

## 2016-11-24 DIAGNOSIS — F329 Major depressive disorder, single episode, unspecified: Secondary | ICD-10-CM | POA: Diagnosis not present

## 2016-11-24 DIAGNOSIS — G2581 Restless legs syndrome: Secondary | ICD-10-CM | POA: Diagnosis not present

## 2016-11-24 NOTE — Patient Instructions (Signed)
It was great to see you today.  Congratulations on your weight loss. Keep up the good work.  Continue to walk/exercise daily.  Please return to see me in 4-6 weeks.

## 2016-11-24 NOTE — Assessment & Plan Note (Signed)
Improved from last visit.  -continue Prozac and Wellbutrin

## 2016-11-24 NOTE — Assessment & Plan Note (Addendum)
18 pound weight loss from last visit. -continue Saxenda 3 mg daily (patient does have some GI side effects, will monitor) -encouraged continued daily exercise

## 2016-11-24 NOTE — Assessment & Plan Note (Signed)
Controlled with Requip.

## 2016-11-25 ENCOUNTER — Other Ambulatory Visit: Payer: Self-pay | Admitting: Family Medicine

## 2016-11-25 MED FILL — MELOXICAM 15 MG TABLET: 15 | 90 days supply | Qty: 90 | Fill #0

## 2016-11-30 ENCOUNTER — Other Ambulatory Visit: Payer: Self-pay | Admitting: Family Medicine

## 2016-11-30 MED FILL — FLUoxetine HCL 20 MG CAPS: 20 | 30 days supply | Qty: 90 | Fill #0

## 2016-12-02 ENCOUNTER — Telehealth: Payer: 59 | Admitting: Family

## 2016-12-02 DIAGNOSIS — M5440 Lumbago with sciatica, unspecified side: Secondary | ICD-10-CM

## 2016-12-02 MED ORDER — BACLOFEN 10 MG PO TABS: 10.0000 mg | ORAL_TABLET | Freq: Three times a day (TID) | ORAL | 0 refills | Status: DC | PRN

## 2016-12-02 MED ORDER — ETODOLAC 300 MG PO CAPS: 300.0000 mg | ORAL_CAPSULE | Freq: Two times a day (BID) | ORAL | 0 refills | Status: DC

## 2016-12-02 MED FILL — ETODOLAC 300 MG CAPSULE: 300 | 10 days supply | Qty: 20 | Fill #0

## 2016-12-02 MED FILL — BACLOFEN 10 MG TABLET: 10 | 10 days supply | Qty: 30 | Fill #0

## 2016-12-02 NOTE — Progress Notes (Signed)

## 2016-12-19 ENCOUNTER — Other Ambulatory Visit: Payer: Self-pay | Admitting: Family Medicine

## 2016-12-19 DIAGNOSIS — M79604 Pain in right leg: Secondary | ICD-10-CM

## 2016-12-19 MED FILL — BUPROPION SR 150 MG TABLET: 150 | 30 days supply | Qty: 60 | Fill #4

## 2016-12-19 NOTE — Progress Notes (Addendum)
Patient seen as walk in. No same day appointments available. She reports Right leg pain, primarily in posterior knee but does radiate to upper thigh and down to calf. No swelling, no fevers, no sob, she has been taking multiple pain medications including aleve and Tramadol with little improvement. She also took Flexeril that was previously prescribed which provided short term relief. Symptoms present for 2 weeks now and have not improved. No trauma, pain is different from previous OA of knee.   MSK: right knee - significant tenderness of posterior knee an upper calf, leg is not swollen compared to the left, no palpable cords, joint line tenderness present; gait - limping while walking  Assessment and Plan Right knee pain - most likely MSK etiology (Bakers cyst vs aggravated OA) however can not rule out DVT. Will order stat US of Right LE.   Dossie Arbour MD

## 2016-12-20 ENCOUNTER — Ambulatory Visit (HOSPITAL_COMMUNITY)
Admission: RE | Admit: 2016-12-20 | Discharge: 2016-12-20 | Disposition: A | Discharge status: Home / Self Care | Payer: 59 | Source: Ambulatory Visit | Attending: Family Medicine | Admitting: Family Medicine

## 2016-12-20 DIAGNOSIS — M79604 Pain in right leg: Secondary | ICD-10-CM | POA: Insufficient documentation

## 2016-12-20 NOTE — Progress Notes (Signed)
VASCULAR LAB PRELIMINARY  PRELIMINARY  PRELIMINARY  PRELIMINARY  Bilateral lower extremity venous duplex completed.    Preliminary report:  Right - No evidence of DVT, superficial thrombosis, or Baker's cyst. Left - No evidence of DVT or superficial thrombosis. There is a small Baker's cyst in the popliteal fossa.  Reeta Kuk, [place], RVS 12/20/2016, 2:23 PM

## 2016-12-28 MED FILL — SAXENDA 18 MG/3 ML PEN: 18 | 30 days supply | Qty: 15 | Fill #1

## 2016-12-28 NOTE — Progress Notes (Signed)
   Subjective:    Patient ID: Sheryl Cooper, female    DOB: 1961/04/12, 56 y.o.   MRN: [id number]  HPI 56 y/o female presents for follow up of right knee pain and Obesity.  Right Knee Pain Patient recently evaluated and sent for LE Doppler which was negative for DVT. Patient taking Mobic 15 mg daily and applying Voltaren gel. Occasional aleve/tylenol as needed.  Knee pain worse over the past 3-4 weeks, no injury, no redness, no increased swelling, points to anterior shin as point of most tenderness, radiates down and up the leg, occasional clicking, rare locking  Obesity Taking Saxenda 3 mg daily. No side effects other than rare nausea.  Tries to eat lunch each day, shake at dinner, does not always eat breakfast (sometimes oatmeal).  Exercise is limited at times due to knee pain, has really gone down over the past few weeks. \  Shaking Occasional, not worse with intension, mother with tremor as she aged, concerned that she may have Parkinson's  Social Nonsmoker  Review of Systems  Constitutional: Negative for fatigue and fever.  Respiratory: Negative for cough.   Cardiovascular: Positive for leg swelling. Negative for chest pain.       Objective:   Physical Exam BP 120/68   Pulse 74   Temp 98.2 F (36.8 C) (Oral)   Ht 5\' 5"  (1.651 m)   Wt 238 lb 12.8 oz (108.3 kg)   LMP 08/17/2012   SpO2 95%   BMI 39.74 kg/m   Weight down to 238 from 260 on 10/07/16.   Gen: pleasant female, NAD MSK: right knee - tenderness over patellar tendon and bilateral joint line, no swelling, mild crepitus with flexion/extension, good end point with lachman/anterior drawer/varus stress/valgus stress; +Thessely, pain but to crepitus with McMurray; Right Hip - no pain with FABER/FADIR testing.  Neuro: minimal baseline tremor in bilateral hands, no pill rolling, extinguishes with intention (finger to nose)  Recent LE Doppler negative for acute DVT.   Knee Injection: Right Written and verbal  consent was obtained after discussing the risks and benefits of the procedure with the patient. The anterior knee was cleansed in a sterile fashion with betadine. 40 mg Depo-medrol and 3 cc 1% Lidocaine was injected using an anterolateral approach using a 5 cc syringe and 25 gauge 11/2 in needle. No complications were encountered. Minimal blood loss. A band aid was applied.       Assessment & Plan:  Right knee pain Right knee pain due to OA (may have meniscal injury as well as +Thessaly) -steroid injection today -if no improvement consider MRI right knee  Morbid obesity ([place]) Weight improved. Down to 238 pounds with Saxenda.  -continue therapy -encouraged exercise as able to with OA  Benign essential tremor Patient with tremor most consistent with essential tremor. Does not significantly interfere with daily living.  -monitor for now, consider BB is symptoms persist

## 2016-12-30 ENCOUNTER — Encounter: Payer: Self-pay | Admitting: Family Medicine

## 2016-12-30 ENCOUNTER — Ambulatory Visit (INDEPENDENT_AMBULATORY_CARE_PROVIDER_SITE_OTHER): Payer: 59 | Admitting: Family Medicine

## 2016-12-30 VITALS — BP 120/68 | HR 74 | Temp 98.2°F | Ht 65.0 in | Wt 238.8 lb

## 2016-12-30 DIAGNOSIS — G8929 Other chronic pain: Secondary | ICD-10-CM

## 2016-12-30 DIAGNOSIS — M25569 Pain in unspecified knee: Secondary | ICD-10-CM | POA: Diagnosis not present

## 2016-12-30 DIAGNOSIS — M25561 Pain in right knee: Secondary | ICD-10-CM | POA: Diagnosis not present

## 2016-12-30 DIAGNOSIS — G250 Essential tremor: Secondary | ICD-10-CM | POA: Diagnosis not present

## 2016-12-30 MED ORDER — METHYLPREDNISOLONE ACETATE 40 MG/ML IJ SUSP
40.0000 mg | Freq: Once | INTRAMUSCULAR | Status: AC
  Administered 2016-12-30: 40 mg via INTRAMUSCULAR

## 2016-12-30 NOTE — Assessment & Plan Note (Signed)
Right knee pain due to OA (may have meniscal injury as well as +Thessaly) -steroid injection today -if no improvement consider MRI right knee

## 2016-12-30 NOTE — Assessment & Plan Note (Signed)
Patient with tremor most consistent with essential tremor. Does not significantly interfere with daily living.  -monitor for now, consider BB is symptoms persist

## 2016-12-30 NOTE — Assessment & Plan Note (Signed)
Weight improved. Down to 238 pounds with Saxenda.  -continue therapy -encouraged exercise as able to with OA

## 2017-01-03 MED FILL — FLUoxetine HCL 20 MG CAPS: 20 | 30 days supply | Qty: 90 | Fill #1

## 2017-01-05 MED FILL — ASPIRIN EC 81 MG TABLET: 81 | 90 days supply | Qty: 90 | Fill #2

## 2017-01-05 MED FILL — PANTOPRAZOLE SOD DR 40 MG T: 40 | 90 days supply | Qty: 90 | Fill #1

## 2017-01-06 ENCOUNTER — Telehealth: Payer: Self-pay | Admitting: Family Medicine

## 2017-01-06 NOTE — Telephone Encounter (Signed)
Patient seen in office.   She reports worsening mood/depression/anxiety. Questions whether she "needs to be committed". Reports frequent worrying after the death of her mother. States that she has "not really done anything" with her life. Fearful that no one would come to her funeral if she died. Has feelings of "would be better off dead" however no active plans to harm self. Her Christella Noa is a joy in her life and gives her a purpose. She has been taking her Prozac and Wellbutrin as prescribed. Tearful during visit.   We discussed that patient could benefit from CBT. Offer phone number to [facility] in [place]. Patient counseled to let PCP know immediately if she has active thoughts of suicide. Patient agreed to notify PCP if she has these thoughts. Offer support.   Dossie Arbour MD

## 2017-01-09 ENCOUNTER — Other Ambulatory Visit: Payer: Self-pay | Admitting: Family Medicine

## 2017-01-11 NOTE — Progress Notes (Signed)
   Subjective:    Patient ID: Sheryl Cooper, female    DOB: [birth year], 58 y.o.   MRN: [id number]  HPI 56 y/o female presents for follow up of Depression and Morbid Obesity.  Depression Currently prescribed Prozac 60 mg daily and Wellbutrin SR 150 mg BID. Patient recently reported worsening mood/depression (see telephone note from 6/8). Patient reported feelings of "I might be better off dead", no active suicide plan. Frequent worrying about the direction of her life after the death of her mother. [facility] for [facility] [place]. Symptoms are relatively unchanged from visit last week.   Obesity Taking Saxenda 3 mg daily. No side effects. Has been able to start using the recumbent bike again (goes most nights of the week for 30 minutes). Reports that sensation of satiety is less than when she started the medication. Reports some dietary indiscretion over the past few weeks.   Right Knee Pain/OA Provided steroid injection on 12/30/16. Patient reports improvement in pain. Has been able to start using the recumbent bike again (goes most nights of the week for 30 minutes).   HM Due for pap smear  Social Nonsmoker, lives along  Review of Systems     Objective:   Physical Exam BP 122/76   Pulse 72   Temp 98.3 F (36.8 C) (Oral)   Ht 5\' 5"  (1.651 m)   Wt 239 lb 9.6 oz (108.7 kg)   LMP 08/17/2012   SpO2 97%   BMI 39.87 kg/m   Gen: pleasant female, NAD Cardiac: RRR, S1 and S2 present, no murmur Resp: CTAB, normal effort      Assessment & Plan:  OA (osteoarthritis) of knee Knee pain improved after steroid injection. No further therapy recommend at this time.   Anxiety and depression Symptoms stable. Still having intermittent thoughts of "would be better off not here" but no active SI.  -encouraged patient to seek [facility] at [facility] [place] -patient working to find activities that bring her joy (exercise, bowling)   Morbid obesity  ([place]) Weight stable.  -encouraged patient to continue exercise now that knee pain is improved -continue Saxenda daily.  -Return in one month for recheck of weight.

## 2017-01-13 ENCOUNTER — Ambulatory Visit (INDEPENDENT_AMBULATORY_CARE_PROVIDER_SITE_OTHER): Payer: 59 | Admitting: Family Medicine

## 2017-01-13 DIAGNOSIS — F32A Depression, unspecified: Secondary | ICD-10-CM

## 2017-01-13 DIAGNOSIS — F419 Anxiety disorder, unspecified: Secondary | ICD-10-CM

## 2017-01-13 DIAGNOSIS — F329 Major depressive disorder, single episode, unspecified: Secondary | ICD-10-CM | POA: Diagnosis not present

## 2017-01-13 DIAGNOSIS — M1711 Unilateral primary osteoarthritis, right knee: Secondary | ICD-10-CM

## 2017-01-13 NOTE — Assessment & Plan Note (Signed)
Knee pain improved after steroid injection. No further therapy recommend at this time.

## 2017-01-13 NOTE — Assessment & Plan Note (Signed)
Symptoms stable. Still having intermittent thoughts of "would be better off not here" but no active SI.  -encouraged patient to seek counseling/CBT at the [facility] [place] -patient working to find activities that bring her joy (exercise, bowling)

## 2017-01-13 NOTE — Assessment & Plan Note (Addendum)
Weight stable.  -encouraged patient to continue exercise now that knee pain is improved -continue Saxenda daily. Reviewed potential risk of thyroid disease with patient.  -Return in one month for recheck of weight.

## 2017-01-13 NOTE — Patient Instructions (Signed)
Please continue to exercise regularly. I like the idea of using the recumbent bike. Look into some of the [facility] fitness classes.   Mood - please call the [facility] as I think this will help with your mood. Continue to go to your bowling league. Consider getting involved with other groups/hobbies.

## 2017-01-14 LAB — CBC
HEMATOCRIT: 37.8 % (ref 34.0–46.6)
HEMOGLOBIN: 12.1 g/dL (ref 11.1–15.9)
MCH: 27.2 pg (ref 26.6–33.0)
MCHC: 32 g/dL (ref 31.5–35.7)
MCV: 85 fL (ref 79–97)
Platelets: 296 10*3/uL (ref 150–379)
RBC: 4.45 x10E6/uL (ref 3.77–5.28)
RDW: 16.6 % — AB (ref 12.3–15.4)
WBC: 6.1 10*3/uL (ref 3.4–10.8)

## 2017-01-14 LAB — CMP14+EGFR
A/G RATIO: 1.6 (ref 1.2–2.2)
ALBUMIN: 4.2 g/dL (ref 3.5–5.5)
ALK PHOS: 78 IU/L (ref 39–117)
ALT: 15 IU/L (ref 0–32)
AST: 24 IU/L (ref 0–40)
BUN / CREAT RATIO: 18 (ref 9–23)
BUN: 17 mg/dL (ref 6–24)
Bilirubin Total: 0.4 mg/dL (ref 0.0–1.2)
CALCIUM: 9.4 mg/dL (ref 8.7–10.2)
CO2: 26 mmol/L (ref 20–29)
CREATININE: 0.96 mg/dL (ref 0.57–1.00)
Chloride: 100 mmol/L (ref 96–106)
GFR calc Af Amer: 75 mL/min/{1.73_m2} (ref >59)
GFR, EST NON AFRICAN AMERICAN: 65 mL/min/{1.73_m2} (ref >59)
GLOBULIN, TOTAL: 2.6 g/dL (ref 1.5–4.5)
Glucose: 82 mg/dL (ref 65–99)
POTASSIUM: 4.3 mmol/L (ref 3.5–5.2)
SODIUM: 140 mmol/L (ref 134–144)
Total Protein: 6.8 g/dL (ref 6.0–8.5)

## 2017-01-14 LAB — LIPID PANEL
CHOL/HDL RATIO: 3.4 ratio (ref 0.0–4.4)
Cholesterol, Total: 200 mg/dL — ABNORMAL HIGH (ref 100–199)
HDL: 59 mg/dL (ref >39)
LDL CALC: 123 mg/dL — AB (ref 0–99)
TRIGLYCERIDES: 88 mg/dL (ref 0–149)
VLDL Cholesterol Cal: 18 mg/dL (ref 5–40)

## 2017-01-17 ENCOUNTER — Telehealth: Payer: Self-pay | Admitting: Family Medicine

## 2017-01-17 NOTE — Telephone Encounter (Signed)
Called and discussed lab results. CBC and CMP in normal limits. Cholesterol mildly elevated. ASCVD 3.1%. Held on Statin at this time.

## 2017-01-18 MED FILL — rOPINIRole HCL 1 MG TABS: 1 | 30 days supply | Qty: 30 | Fill #5

## 2017-01-18 MED FILL — BUPROPION SR 150 MG TABLET: 150 | 30 days supply | Qty: 60 | Fill #5

## 2017-02-03 MED FILL — FLUoxetine HCL 20 MG CAPS: 20 | 30 days supply | Qty: 90 | Fill #2

## 2017-02-06 ENCOUNTER — Telehealth: Payer: 59 | Admitting: Family

## 2017-02-06 DIAGNOSIS — R5381 Other malaise: Secondary | ICD-10-CM

## 2017-02-06 NOTE — Progress Notes (Signed)
Thank you for the details you put in the comment boxes. Those details really help [place] take better care of you. I am very concerned about your symptoms as they may indicate another serious condition such as appendicitis or problems with your gallbladder or kidneys. The only safe way to evaluate you is face-to-face so that we can do a physical exam and probably an ultrasound and labwork also.   Based on what you shared with me it looks like you have a serious condition that should be evaluated in a face to face office visit.  NOTE: Even if you have entered your credit card information for this eVisit, you will not be charged.   If you are having a true medical emergency please call 911.  If you need an urgent face to face visit, [facility] has four urgent care centers for your convenience.  If you need care fast and have a high deductible or no insurance consider:   [email or web address]  [phone]  [address], [place], [place] [address] 8 am to 8 pm Monday-Friday 10 am to 4 pm Saturday-Sunday   The following sites will take your  insurance:    . [facility] [place] a Provider at this Location  [address] [place], [place] [address] . 10 am to 8 pm Monday-Friday . 12 pm to 8 pm Saturday-Sunday   . [facility] [place] a Provider at this Location  [address], [place], [place] [address] . 8 am to 8 pm Monday-Friday . 9 am to 6 pm Saturday . 11 am to 6 pm Sunday   . [facility] at [place] Get Driving Directions  [address], [place] [address] . 8 am to 8 pm Monday-Friday . 8 am to 4 pm Saturday-Sunday   Your e-visit answers were reviewed by a board certified advanced clinical practitioner to complete your personal care plan.  Thank you for using  e-Visits.

## 2017-02-15 ENCOUNTER — Other Ambulatory Visit: Payer: Self-pay | Admitting: *Deleted

## 2017-02-15 MED ORDER — ROPINIROLE HCL 1 MG PO TABS: 1.0000 mg | ORAL_TABLET | Freq: Every day | ORAL | 1 refills | Status: DC

## 2017-02-15 MED ORDER — TRAMADOL HCL 50 MG PO TABS: 50.0000 mg | ORAL_TABLET | Freq: Three times a day (TID) | ORAL | 1 refills | Status: DC | PRN

## 2017-02-15 MED ORDER — BUPROPION HCL ER (SR) 150 MG PO TB12: ORAL_TABLET | ORAL | 5 refills | Status: DC

## 2017-02-15 MED FILL — BUPROPION SR 150 MG TABLET: 150 | 30 days supply | Qty: 60 | Fill #0

## 2017-02-15 MED FILL — LOSARTAN POTASSIUM 50 MG TA: 50 | 90 days supply | Qty: 90 | Fill #1

## 2017-02-15 MED FILL — HYDROCHLOROTHIAZIDE 25 MG T: 25 | 90 days supply | Qty: 90 | Fill #1

## 2017-02-15 MED FILL — rOPINIRole HCL 1 MG TABS: 1 | 90 days supply | Qty: 90 | Fill #0

## 2017-02-17 MED FILL — traMADol HCL 50 MG TABS: 50 | 20 days supply | Qty: 60 | Fill #0

## 2017-02-21 MED FILL — MELOXICAM 15 MG TABLET: 15 | 90 days supply | Qty: 90 | Fill #1

## 2017-02-21 MED FILL — DICLOFENAC SODIUM 1% GEL: 1 | 6 days supply | Qty: 100 | Fill #1

## 2017-02-22 ENCOUNTER — Telehealth: Payer: Self-pay | Admitting: *Deleted

## 2017-02-22 NOTE — Telephone Encounter (Signed)
Prior Authorization received from [facility] for [place]. PA form placed in provider box for completion. Derl Barrow, RN

## 2017-03-02 ENCOUNTER — Other Ambulatory Visit: Payer: Self-pay | Admitting: *Deleted

## 2017-03-02 NOTE — Telephone Encounter (Signed)
Patient still have refill at the pharmacy. Please advise her to contact pharmacy first.

## 2017-03-06 ENCOUNTER — Other Ambulatory Visit: Payer: Self-pay | Admitting: *Deleted

## 2017-03-06 MED ORDER — FLUOXETINE HCL 20 MG PO CAPS: 60.0000 mg | ORAL_CAPSULE | Freq: Every day | ORAL | 2 refills | Status: DC

## 2017-03-06 MED FILL — FLUoxetine HCL 20 MG CAPS: 20 | 30 days supply | Qty: 90 | Fill #0

## 2017-03-08 ENCOUNTER — Ambulatory Visit: Payer: 59 | Admitting: Family Medicine

## 2017-03-09 NOTE — Telephone Encounter (Addendum)
Contacted April at [facility] [phone]) to complete PA for Saxenda via phone. Phentermine previously tried and failed. PA has been successfully submitted. Will take 24-72 hours for response. No PA ref # at this time since there has been a previous number generated. Ref will be "April" and today's date (03/09/2017) L. Silvano Rusk, RN, BSN

## 2017-03-14 MED FILL — SAXENDA 18 MG/3 ML PEN: 18 | 30 days supply | Qty: 15 | Fill #2

## 2017-03-14 NOTE — Telephone Encounter (Signed)
PA approved for Saxenda 3 mg/ml valid dates 02/23/17-02/22/18, with max of 12 refills.  Approved for quantity limit of 15 mL (5 pre-filled syringes) per 30 days. Reference number: [id number], Plan Code: IY641.  Derl Barrow, RN

## 2017-03-15 ENCOUNTER — Ambulatory Visit (INDEPENDENT_AMBULATORY_CARE_PROVIDER_SITE_OTHER): Payer: 59 | Admitting: Family Medicine

## 2017-03-15 ENCOUNTER — Encounter: Payer: Self-pay | Admitting: Family Medicine

## 2017-03-15 DIAGNOSIS — F329 Major depressive disorder, single episode, unspecified: Secondary | ICD-10-CM | POA: Diagnosis not present

## 2017-03-15 DIAGNOSIS — F32A Depression, unspecified: Secondary | ICD-10-CM

## 2017-03-15 DIAGNOSIS — F419 Anxiety disorder, unspecified: Secondary | ICD-10-CM | POA: Diagnosis not present

## 2017-03-15 NOTE — Patient Instructions (Signed)
[facility]    [address] [place], [place], [place] [address]  Hours:  Open ? Closes 7PM                       Phone: [phone]

## 2017-03-15 NOTE — Assessment & Plan Note (Signed)
Acute worsening of symptomatology. We discussed. She's currently on a good medication regimen and will continue that. She is not missing doses. Long discussion with her spending greater than 50% of our 25 minute office  in counseling and education regarding absolute need for some cognitive behavioral therapy. Gave her phone number. I told her not to worry the next month about household chores, weight loss etc. Her main focus would be to continue to work full-time at her job, meet the absolute essentials of her ADLs including food, bathing. It is most important to make phone: call and get counseling appointment. We'll see her one month. She will call in the interim with new or worsening symptoms.

## 2017-03-15 NOTE — Progress Notes (Signed)
    CHIEF COMPLAINT / HPI:   The patient to my practice here for follow-up. Major issues today is depression. The last 3-4 weeks her depression is been much more severe. She is barely able to make herself go to work. She's not attending to her household chores, dying her hair like she typically does, exercising or food management. Says she just doesn't feel like doing anything. She'll spend entire weekend essentially staring at the wall or the television. Frequent does not get dressed on weekends. She's very upset with herself for this behavior. Has not had any recent suicidal ideation but a couple weeks ago she did think that she might "new better off dead". Currently none. Never had intent or plan. Says her grandson needs her; those thoughts  focuse  and t  always grounds her.   REVIEW OF SYSTEMS:  See HPI  OBJECTIVE:  Vital signs are reviewed.   GEN.: Well-developed overweight female no acute distress but appears unhappy. CV: Regular rate and rhythm Respiratory: Normal respiratory effort. Psychiatric: Alert and oriented 4. Affects flat with some occasional tearfulness but she has intact mentation. Recent and remote memory is intact. Judgment is normal. Speech is low in volume but otherwise fluent. Denies suicidal or homicidal ideation. Denies hallucinations. No psychomotor retardation and no agitation.  ASSESSMENT / PLAN: Please see problem oriented charting for details

## 2017-03-17 ENCOUNTER — Telehealth: Payer: 59 | Admitting: Family

## 2017-03-17 DIAGNOSIS — M545 Low back pain, unspecified: Secondary | ICD-10-CM

## 2017-03-17 MED ORDER — BACLOFEN 10 MG PO TABS: 10.0000 mg | ORAL_TABLET | Freq: Three times a day (TID) | ORAL | 0 refills | Status: DC | PRN

## 2017-03-17 MED ORDER — ETODOLAC 300 MG PO CAPS: 300.0000 mg | ORAL_CAPSULE | Freq: Three times a day (TID) | ORAL | 0 refills | Status: DC

## 2017-03-17 MED FILL — ETODOLAC 300 MG CAPSULE: 300 | 3 days supply | Qty: 10 | Fill #0

## 2017-03-17 MED FILL — BUPROPION SR 150 MG TABLET: 150 | 30 days supply | Qty: 60 | Fill #1

## 2017-03-17 MED FILL — BACLOFEN 10 MG TABLET: 10 | 10 days supply | Qty: 30 | Fill #0

## 2017-03-17 NOTE — Progress Notes (Signed)
Thank you for the details you put in the comment boxes. Those details really help us take better care of you.   We are sorry that you are not feeling well.  Here is how we plan to help!  Based on what you have shared with me it looks like you mostly have acute back pain.  Acute back pain is defined as musculoskeletal pain that can resolve in 1-3 weeks with conservative treatment.  I have prescribed Etodolac 300 mg twice a day non-steroid anti-inflammatory (NSAID) as well as Baclofen 10 mg every eight hours as needed which is a muscle relaxer  Some patients experience stomach irritation or in increased heartburn with anti-inflammatory drugs.  Please keep in mind that muscle relaxer's can cause fatigue and should not be taken while at work or driving.  Back pain is very common.  The pain often gets better over time.  The cause of back pain is usually not dangerous.  Most people can learn to manage their back pain on their own.  Home Care  Stay active.  Start with short walks on flat ground if you can.  Try to walk farther each day.  Do not sit, drive or stand in one place for more than 30 minutes.  Do not stay in bed.  Do not avoid exercise or work.  Activity can help your back heal faster.  Be careful when you bend or lift an object.  Bend at your knees, keep the object close to you, and do not twist.  Sleep on a firm mattress.  Lie on your side, and bend your knees.  If you lie on your back, put a pillow under your knees.  Only take medicines as told by your doctor.  Put ice on the injured area.  Put ice in a plastic bag  Place a towel between your skin and the bag  Leave the ice on for 15-20 minutes, 3-4 times a day for the first 2-3 days. 210 After that, you can switch between ice and heat packs.  Ask your doctor about back exercises or massage.  Avoid feeling anxious or stressed.  Find good ways to deal with stress, such as exercise.  Get Help Right Way If:  Your pain does  not go away with rest or medicine.  Your pain does not go away in 1 week.  You have new problems.  You do not feel well.  The pain spreads into your legs.  You cannot control when you poop (bowel movement) or pee (urinate)  You feel sick to your stomach (nauseous) or throw up (vomit)  You have belly (abdominal) pain.  You feel like you may pass out (faint).  If you develop a fever.  Make Sure you:  Understand these instructions.  Will watch your condition  Will get help right away if you are not doing well or get worse.  Your e-visit answers were reviewed by a board certified advanced clinical practitioner to complete your personal care plan.  Depending on the condition, your plan could have included both over the counter or prescription medications.  If there is a problem please reply  once you have received a response from your provider.  Your safety is important to us.  If you have drug allergies check your prescription carefully.    You can use MyChart to ask questions about today's visit, request a non-urgent call back, or ask for a work or school excuse for 24 hours related to this e-Visit. If   it has been greater than 24 hours you will need to follow up with your provider, or enter a new e-Visit to address those concerns.  You will get an e-mail in the next two days asking about your experience.  I hope that your e-visit has been valuable and will speed your recovery. Thank you for using e-visits.    

## 2017-03-30 ENCOUNTER — Ambulatory Visit (INDEPENDENT_AMBULATORY_CARE_PROVIDER_SITE_OTHER): Payer: 59 | Admitting: Student

## 2017-03-30 DIAGNOSIS — H65191 Other acute nonsuppurative otitis media, right ear: Secondary | ICD-10-CM | POA: Diagnosis not present

## 2017-03-30 MED ORDER — AMOXICILLIN 500 MG PO CAPS: 500.0000 mg | ORAL_CAPSULE | Freq: Three times a day (TID) | ORAL | 0 refills | Status: DC

## 2017-03-30 MED FILL — AMOXICILLIN 500 MG CAPSULE: 500 | 5 days supply | Qty: 15 | Fill #0

## 2017-03-30 NOTE — Patient Instructions (Addendum)
It was great seeing you today! We have addressed the following issues today 1. Ear pain: This is likely due to infection. We sent a prescription for Amoxil in to your pharmacy. Take this medication until completed the course. We also suggest you use Afrin nose spray but not for more than 3 days.  If we did any lab work today, and the results require attention, either me or my nurse will get in touch with you. If everything is normal, you will get a letter in mail and a message via . If you don't hear from [place] in two weeks, please give [place] a call. Otherwise, we look forward to seeing you again at your next visit. If you have any questions or concerns before then, please call the clinic at [phone].  Please bring all your medications to every doctors visit  Sign up for My Chart to have easy access to your labs results, and communication with your Primary care physician.    Please check-out at the front desk before leaving the clinic.    Take Care,   Dr. Cyndia Skeeters

## 2017-03-30 NOTE — Progress Notes (Signed)
  Subjective:    Sheryl Cooper is a 56 y.o. old female here SDA for right ear pain  HPI Right ear pain: for one day. No history of ear infection in the past. Denies URI symptoms or swimming. She describes the pain as achy. Denies Q-tip use. Denies fever. Denies chewing gum. Denies new medication. PMH/Problem List: has Morbid obesity ([place]); PANIC ATTACK; KNEE PAIN, BILATERAL; HTN (hypertension); Well woman exam; Restless leg syndrome; Insomnia; Venous insufficiency; OA (osteoarthritis) of knee; Chronic post-traumatic stress disorder (PTSD); Anxiety and depression; Back pain; Health care maintenance; Gastro-esophageal reflux disease without esophagitis; Right leg pain; and Benign essential tremor on her problem list.   has a past medical history of Abdominal distension; Abdominal pain, left lower quadrant (02/13/2015); Anxiety; Arthritis; Chest pain; Conjunctivitis; Episode of syncope (09/27/2010); Hypertension; Insomnia; Obesity, unspecified; PANIC ATTACK; and Restless leg syndrome.  FH:  Family History  Problem Relation Age of Onset  . Diabetes Mother   . Heart disease Mother   . Hypertension Mother   . Cancer Mother        breast (74), cervical (68), skin (30)  . Colon cancer Neg Hx   . Esophageal cancer Neg Hx   . Stomach cancer Neg Hx   . Rectal cancer Neg Hx     SH Social History  Substance Use Topics  . Smoking status: Former Smoker    Quit date: 09/24/1993  . Smokeless tobacco: Never Used  . Alcohol use No    Review of Systems Review of systems negative except for pertinent positives and negatives in history of present illness above.     Objective:    Physical Exam GEN: appears well, no apparent distress. Head: normocephalic and atraumatic.  Ear: No periauricular skin lesion or swelling, no discharge, no periauricular tenderness to palpation, no tenderness with pressure on tragus or gentle tug on pinnae.   Ear canal: with normal landmarks of external ear, erythema of the  pars flaccida in the right, more opacity of right TM compared to left TM.  Hearing: hearing is grossly intact bilaterally  Eyes: conjunctiva without injection, sclera anicteric Oropharynx: mmm without erythema or exudation HEM: negative for cervical or periauricular lymphadenopathies RESP: no IWOB NEURO: alert and oiented appropriately, no gross deficits  PSYCH: euthymic mood with congruent affect    Assessment and Plan:  1. Other acute nonsuppurative otitis media of right ear, recurrence not specified - amoxicillin (AMOXIL) 500 MG capsule; Take 1 capsule (500 mg total) by mouth 3 (three) times daily.  Dispense: 15 capsule; Refill: 0 -Recommended Afrin for 3 days.   Return if symptoms worsen or fail to improve.  Mercy Riding, MD 03/30/17 Pager: [phone]

## 2017-04-04 MED FILL — FLUoxetine HCL 20 MG CAPS: 20 | 30 days supply | Qty: 90 | Fill #1

## 2017-04-04 MED FILL — ASPIRIN ADULT LOW STRENGTH: 81 | 90 days supply | Qty: 90 | Fill #3

## 2017-04-10 ENCOUNTER — Telehealth: Payer: Self-pay | Admitting: Family Medicine

## 2017-04-10 DIAGNOSIS — R1013 Epigastric pain: Secondary | ICD-10-CM

## 2017-04-10 DIAGNOSIS — R109 Unspecified abdominal pain: Secondary | ICD-10-CM

## 2017-04-10 DIAGNOSIS — R140 Abdominal distension (gaseous): Secondary | ICD-10-CM

## 2017-04-10 DIAGNOSIS — K8070 Calculus of gallbladder and bile duct without cholecystitis without obstruction: Secondary | ICD-10-CM

## 2017-04-10 MED ORDER — PANTOPRAZOLE SODIUM 40 MG PO TBEC: DELAYED_RELEASE_TABLET | ORAL | 3 refills | Status: DC

## 2017-04-10 MED FILL — PANTOPRAZOLE SOD DR 40 MG T: 40 | 90 days supply | Qty: 90 | Fill #0

## 2017-04-10 NOTE — Telephone Encounter (Signed)
Needs her refill on protonix.  [facility] pharmacy

## 2017-04-17 ENCOUNTER — Other Ambulatory Visit: Payer: Self-pay | Admitting: Student

## 2017-04-17 DIAGNOSIS — H9201 Otalgia, right ear: Secondary | ICD-10-CM

## 2017-04-18 MED FILL — BUPROPION SR 150 MG TABLET: 150 | 30 days supply | Qty: 60 | Fill #2

## 2017-04-20 ENCOUNTER — Telehealth: Payer: Self-pay | Admitting: *Deleted

## 2017-04-20 NOTE — Telephone Encounter (Signed)
Patient continues with right ear pain after completing amoxicillin 2 weeks ago. Describes pain as sharp; rates 7-8 at present but at times can be 10/10. Ear canal looks erythematous. Please advise. Hubbard Hartshorn, RN, BSN

## 2017-04-21 MED FILL — DICLOFENAC SODIUM 1% GEL: 1 | 6 days supply | Qty: 100 | Fill #2

## 2017-04-21 NOTE — Telephone Encounter (Signed)
Either needs to add OTC pseudophed for 5 days and or flonase---or she can be seen Likely eustachain tube dysfucntion Sheryl Cooper

## 2017-04-21 NOTE — Telephone Encounter (Signed)
Patient will try the Flonase for a few days to see if it will help. If not she will schedule an appointment. She has Flonase at home.  Derl Barrow, RN

## 2017-05-11 MED FILL — FLUoxetine HCL 20 MG CAPS: 20 | 30 days supply | Qty: 90 | Fill #2

## 2017-05-17 ENCOUNTER — Other Ambulatory Visit: Payer: Self-pay | Admitting: Family Medicine

## 2017-05-17 MED FILL — rOPINIRole HCL 1 MG TABS: 1 | 90 days supply | Qty: 90 | Fill #1

## 2017-05-17 MED FILL — BUPROPION SR 150 MG TABLET: 150 | 30 days supply | Qty: 60 | Fill #3

## 2017-05-19 MED FILL — traMADol HCL 50 MG TABS: 50 | 20 days supply | Qty: 60 | Fill #0

## 2017-05-19 NOTE — Telephone Encounter (Signed)
Dear White Team Please call this in THANKS! Kiaan Overholser  

## 2017-05-19 NOTE — Telephone Encounter (Signed)
Rx phoned in. Katharina Caper, Francie Keeling D, [place]

## 2017-05-22 ENCOUNTER — Ambulatory Visit (INDEPENDENT_AMBULATORY_CARE_PROVIDER_SITE_OTHER): Payer: 59 | Admitting: Family Medicine

## 2017-05-22 VITALS — BP 128/80 | HR 79 | Temp 97.9°F | Ht 65.0 in

## 2017-05-22 DIAGNOSIS — L249 Irritant contact dermatitis, unspecified cause: Secondary | ICD-10-CM | POA: Diagnosis not present

## 2017-05-22 MED ORDER — PREDNISONE 10 MG (21) PO TBPK: ORAL_TABLET | ORAL | 1 refills | Status: DC

## 2017-05-22 MED FILL — predniSONE 10 MG TABS: 10 | 6 days supply | Qty: 21 | Fill #0

## 2017-05-22 NOTE — Patient Instructions (Signed)
  Hi [name]! Sorry about this rash! I hope it clears up with steroids. Try itch-x gel from the pharmacy to help with itching in the mean time.  Lucila Maine, DO PGY-2, [facility] 05/22/2017 4:32 PM

## 2017-05-22 NOTE — Progress Notes (Signed)
    Subjective:    Patient ID: Sheryl Cooper, female    DOB: 1961-01-08, 56 y.o.   MRN: [id number]   CC: rash on face  HPI: Patient reports some itchy bumps on forehead this morning when she woke up that has spread down her face to her neck, chest, upper arms. She denies new soaps, detergents, make up. She denies viral URI symptoms, no recent illnesses, no new medications. She does say she cleaned her carpets with a new soap yesterday but does not recall getting it on her face/skin.   Smoking status reviewed- former smoker  Review of Systems- no fevers, chills, lip/face swelling, SOB   Objective:  BP 128/80 (BP Location: Right Arm, Patient Position: Sitting, Cuff Size: Large)   Pulse 79   Temp 97.9 F (36.6 C) (Oral)   Ht 5\' 5"  (1.651 m)   LMP 08/17/2012   SpO2 98%  Vitals and nursing note reviewed  General: well nourished, in no acute distress HEENT: normocephalic, no scleral icterus or conjunctival pallor, no nasal discharge, moist mucous membranes Neck: supple, non-tender, without lymphadenopathy Skin: warm and dry, some dry flaking skin on forehead, several erythematous papules over face, neck, chest with evidence of excoriation Neuro: alert and oriented, no focal deficits  Assessment & Plan:    Irritant contact dermatitis  Rash consistent with contact dermatitis, unknown trigger for this, possibly new carpet cleaner used yesterday but unclear.   -rx given for steroid taper with 1 refill -advised topical itch relief gel -benadryl as needed for itching -follow up if rash worsens or fails to improve with steroid  Return if symptoms worsen or fail to improve.   Lucila Maine, DO Family Medicine Resident PGY-2

## 2017-05-22 NOTE — Telephone Encounter (Signed)
Error

## 2017-05-22 NOTE — Assessment & Plan Note (Signed)
  Rash consistent with contact dermatitis, unknown trigger for this, possibly new carpet cleaner used yesterday but unclear.   -rx given for steroid taper with 1 refill -advised topical itch relief gel -benadryl as needed for itching -follow up if rash worsens or fails to improve with steroid

## 2017-05-23 ENCOUNTER — Telehealth: Payer: Self-pay

## 2017-05-23 MED FILL — LOSARTAN POTASSIUM 50 MG TA: 50 | 90 days supply | Qty: 90 | Fill #0

## 2017-05-23 NOTE — Telephone Encounter (Signed)
Pt needs refill of her losartan and HCTZ. Needs the ASAP as she is out. Please send to [facility]. The pharmacy said they have sent 3 request. Ottis Stain, CMA

## 2017-05-24 ENCOUNTER — Other Ambulatory Visit: Payer: Self-pay | Admitting: Family Medicine

## 2017-05-24 MED ORDER — HYDROCHLOROTHIAZIDE 25 MG PO TABS: 25.0000 mg | ORAL_TABLET | Freq: Every day | ORAL | 3 refills | Status: DC

## 2017-05-24 MED ORDER — LOSARTAN POTASSIUM 50 MG PO TABS: 50.0000 mg | ORAL_TABLET | Freq: Every day | ORAL | 3 refills | Status: DC

## 2017-05-24 MED FILL — HYDROCHLOROTHIAZIDE 25 MG T: 25 | 90 days supply | Qty: 90 | Fill #0

## 2017-05-24 NOTE — Addendum Note (Signed)
Addended byDorcas Mcmurray L on: 05/24/2017 10:30 AM   Modules accepted: Orders

## 2017-05-25 ENCOUNTER — Telehealth: Payer: Self-pay

## 2017-05-25 NOTE — Telephone Encounter (Deleted)
Pt needs a refill of her meloxicam. Refill request is in your box. Ottis Stain, CMA

## 2017-05-25 NOTE — Telephone Encounter (Signed)
Pt needs a refill of meloxicam. Refill request is in your box. Ottis Stain, CMA

## 2017-05-26 MED FILL — MELOXICAM 15 MG TABLET: 15 | 90 days supply | Qty: 90 | Fill #0

## 2017-06-14 ENCOUNTER — Other Ambulatory Visit: Payer: Self-pay | Admitting: Family Medicine

## 2017-06-14 MED FILL — BUPROPION SR 150 MG TABLET: 150 | 30 days supply | Qty: 60 | Fill #4

## 2017-06-16 MED FILL — FLUoxetine HCL 20 MG CAPS: 20 | 30 days supply | Qty: 90 | Fill #0

## 2017-06-27 ENCOUNTER — Telehealth: Payer: 59 | Admitting: Family

## 2017-06-27 DIAGNOSIS — J069 Acute upper respiratory infection, unspecified: Secondary | ICD-10-CM

## 2017-06-27 MED ORDER — PREDNISONE 10 MG (21) PO TBPK: ORAL_TABLET | ORAL | 0 refills | Status: DC

## 2017-06-27 MED FILL — predniSONE 10 MG TABS: 10 | 6 days supply | Qty: 21 | Fill #0

## 2017-06-27 NOTE — Progress Notes (Signed)
We are sorry that you are not feeling well.  Here is how we plan to help!  Based on your presentation I believe you most likely have A cough due to allergies.  I recommend that you start the an over-the counter-allergy medication such as Claritin 10 mg or Zyrtec 10 mg daily.     In addition you may use A non-prescription cough medication called Robitussin DAC. Take 2 teaspoons every 8 hours or Delsym: take 2 teaspoons every 12 hours.  Sterapred 5 mg dosepak  From your responses in the eVisit questionnaire you describe inflammation in the upper respiratory tract which is causing a significant cough.  This is commonly called Bronchitis and has four common causes:    Allergies  Viral Infections  Acid Reflux  Bacterial Infection Allergies, viruses and acid reflux are treated by controlling symptoms or eliminating the cause. An example might be a cough caused by taking certain blood pressure medications. You stop the cough by changing the medication. Another example might be a cough caused by acid reflux. Controlling the reflux helps control the cough.  USE OF BRONCHODILATOR ("RESCUE") INHALERS: There is a risk from using your bronchodilator too frequently.  The risk is that over-reliance on a medication which only relaxes the muscles surrounding the breathing tubes can reduce the effectiveness of medications prescribed to reduce swelling and congestion of the tubes themselves.  Although you feel brief relief from the bronchodilator inhaler, your asthma may actually be worsening with the tubes becoming more swollen and filled with mucus.  This can delay other crucial treatments, such as oral steroid medications. If you need to use a bronchodilator inhaler daily, several times per day, you should discuss this with your provider.  There are probably better treatments that could be used to keep your asthma under control.     HOME CARE . Only take medications as instructed by your medical  team. . Complete the entire course of an antibiotic. . Drink plenty of fluids and get plenty of rest. . Avoid close contacts especially the very young and the elderly . Cover your mouth if you cough or cough into your sleeve. . Always remember to wash your hands . A steam or ultrasonic humidifier can help congestion.   GET HELP RIGHT AWAY IF: . You develop worsening fever. . You become short of breath . You cough up blood. . Your symptoms persist after you have completed your treatment plan MAKE SURE YOU   Understand these instructions.  Will watch your condition.  Will get help right away if you are not doing well or get worse.  Your e-visit answers were reviewed by a board certified advanced clinical practitioner to complete your personal care plan.  Depending on the condition, your plan could have included both over the counter or prescription medications. If there is a problem please reply  once you have received a response from your provider. Your safety is important to [place].  If you have drug allergies check your prescription carefully.    You can use MyChart to ask questions about today's visit, request a non-urgent call back, or ask for a work or school excuse for 24 hours related to this e-Visit. If it has been greater than 24 hours you will need to follow up with your provider, or enter a new e-Visit to address those concerns. You will get an e-mail in the next two days asking about your experience.  I hope that your e-visit has been valuable and will  speed your recovery. Thank you for using e-visits.

## 2017-07-06 MED FILL — PANTOPRAZOLE SOD DR 40 MG T: 40 | 90 days supply | Qty: 90 | Fill #1

## 2017-07-12 ENCOUNTER — Ambulatory Visit (INDEPENDENT_AMBULATORY_CARE_PROVIDER_SITE_OTHER): Payer: 59 | Admitting: Family Medicine

## 2017-07-12 ENCOUNTER — Other Ambulatory Visit: Payer: Self-pay

## 2017-07-12 ENCOUNTER — Encounter: Payer: Self-pay | Admitting: Family Medicine

## 2017-07-12 DIAGNOSIS — F419 Anxiety disorder, unspecified: Secondary | ICD-10-CM | POA: Diagnosis not present

## 2017-07-12 DIAGNOSIS — I10 Essential (primary) hypertension: Secondary | ICD-10-CM

## 2017-07-12 DIAGNOSIS — F32A Depression, unspecified: Secondary | ICD-10-CM

## 2017-07-12 DIAGNOSIS — M170 Bilateral primary osteoarthritis of knee: Secondary | ICD-10-CM | POA: Diagnosis not present

## 2017-07-12 DIAGNOSIS — F329 Major depressive disorder, single episode, unspecified: Secondary | ICD-10-CM

## 2017-07-12 MED ORDER — METHYLPHENIDATE HCL 20 MG PO TABS: 20.0000 mg | ORAL_TABLET | Freq: Two times a day (BID) | ORAL | 0 refills | Status: DC

## 2017-07-12 MED FILL — ASPIRIN ADULT LOW STRENGTH: 81 | 90 days supply | Qty: 90 | Fill #0

## 2017-07-12 MED FILL — METHYLPHENIDATE 20 MG TAB: 20 | 30 days supply | Qty: 60 | Fill #0

## 2017-07-12 NOTE — Progress Notes (Signed)
    CHIEF COMPLAINT / HPI:  #1.  Follow-up depression.  Doing better.  Started seeing a counselor and that is helping quite a bit.  Counselor's name is Arbie Cookey.  She sees her once or twice a week.  Feels this is been the most impactful thing to her chronic issues and depression ever.  Counselor asked if she had ever been diagnosed with ADHD  #2.  ADHD?  Reports long-standing history throughout early school years and into her adult life of disorganization that impacts her job.  She makes lists but finds that at the end of the day the lists are not completed.  She easily gets distracted.  Will start one project and end up not completing it.  #3.  Obesity: After our discussion last time she quit stressing about her diet and stopped her liraglutide.  Feels like she needs to get her life in order first.  Think she made the right choice even though she is gained some weight because she is addressing her depression much more effectively.  #4.  Chronic right knee pain: Getting worse.  Having some difficulty with ambulation.  Last corticosteroid injection June 1, 208. REVIEW OF SYSTEMS:  Depressive symptoms improved specifically mood is more stable and upbeat.  Energy level is a bit better.  No suicidal or homicidal ideation.  No fever.  She has had some weight gain. Denies chest pain.  No palpitations.  Chronic knee pain a little worse.  PERTINENT  PMH / PSH: I have reviewed the patient's medications, allergies, past medical and surgical history, smoking status and updated in the EMR as appropriate.   OBJECTIVE:  GENERAL: Well-developed female no acute distress overweight PSYCHIATRIC: Alert and oriented x4.  Affect is interactive.  Neatly dressed.  Eye contact much improved over last office visit.  Speech is normal in fluency and content.  Her sense of humor is back.  Recent and remote memory intact.  Speech normal fluency and content. KNEE: RIGHT: Has full extension but last 10 degrees is with  significant pain and difficult.  Full flexion.  Medial and lateral joint line tenderness.  Some synovial hypertrophy.  No effusion. SKIN: Area of the right knee is without any unusual lesion.  No unusual warmth.  Normal skin color. VASCULAR: Dorsalis pedis pulses and posterior tibial pulses 2+ bilaterally symmetrical. NEURO: Intact sensation soft touch bilateral lower extremities.  ASSESSMENT / PLAN: Please see problem oriented charting for details

## 2017-07-12 NOTE — Assessment & Plan Note (Signed)
Currently good control.  Continue HCTZ and Cozaar 50 mg. LDL 123 at last check.  Will be due recheck in June.  Also check kidney function basic metabolic profile at that time.

## 2017-07-12 NOTE — Assessment & Plan Note (Signed)
Significant improvement with cognitive behavioral therapy.  Congratulated her on making that first step in sticking with it.  We will continue current medications.  Like to see her back in 2 months.

## 2017-07-12 NOTE — Assessment & Plan Note (Signed)
Sitter getting updated x-rays.  Last corticosteroid injection was 6 months ago.  She might be eligible for electrical stim brace trial.

## 2017-07-18 MED FILL — FLUoxetine HCL 20 MG CAPS: 20 | 30 days supply | Qty: 90 | Fill #1

## 2017-07-18 MED FILL — BUPROPION SR 150 MG TABLET: 150 | 30 days supply | Qty: 60 | Fill #5

## 2017-08-16 ENCOUNTER — Other Ambulatory Visit: Payer: Self-pay | Admitting: Family Medicine

## 2017-08-16 MED FILL — LOSARTAN POTASSIUM 50 MG TA: 50 | 90 days supply | Qty: 90 | Fill #1

## 2017-08-16 MED FILL — FLUoxetine HCL 20 MG CAPS: 20 | 30 days supply | Qty: 90 | Fill #2

## 2017-08-16 MED FILL — HYDROCHLOROTHIAZIDE 25 MG T: 25 | 90 days supply | Qty: 90 | Fill #1

## 2017-08-16 MED FILL — BUPROPION SR 150 MG TABLET: 150 | 30 days supply | Qty: 60 | Fill #0

## 2017-08-16 MED FILL — rOPINIRole HCL 1 MG TABS: 1 | 90 days supply | Qty: 90 | Fill #0

## 2017-08-23 MED FILL — MELOXICAM 15 MG TABLET: 15 | 90 days supply | Qty: 90 | Fill #1

## 2017-08-24 ENCOUNTER — Telehealth: Payer: 59 | Admitting: Physician Assistant

## 2017-08-24 DIAGNOSIS — M549 Dorsalgia, unspecified: Secondary | ICD-10-CM

## 2017-08-24 MED ORDER — BACLOFEN 10 MG PO TABS
10.0000 mg | ORAL_TABLET | Freq: Three times a day (TID) | ORAL | 0 refills | Status: DC
Start: 2017-08-24 — End: 2018-01-03

## 2017-08-24 MED ORDER — NAPROXEN 500 MG PO TBEC: 500.0000 mg | DELAYED_RELEASE_TABLET | Freq: Two times a day (BID) | ORAL | 0 refills | Status: DC

## 2017-08-24 MED FILL — NAPROXEN DR 500 MG TABLET: 500 | 10 days supply | Qty: 20 | Fill #0

## 2017-08-24 MED FILL — BACLOFEN 10 MG TABLET: 10 | 10 days supply | Qty: 30 | Fill #0

## 2017-08-24 NOTE — Progress Notes (Signed)

## 2017-08-26 ENCOUNTER — Other Ambulatory Visit: Payer: Self-pay | Admitting: Family Medicine

## 2017-08-26 ENCOUNTER — Telehealth: Payer: Self-pay | Admitting: Family Medicine

## 2017-08-26 DIAGNOSIS — G43011 Migraine without aura, intractable, with status migrainosus: Secondary | ICD-10-CM

## 2017-08-26 MED ORDER — SUMATRIPTAN SUCCINATE 50 MG PO TABS: 50.0000 mg | ORAL_TABLET | ORAL | 0 refills | Status: DC | PRN

## 2017-08-26 NOTE — Telephone Encounter (Signed)
**  After Hours/ Emergency Line Call**  Received call from patient regarding persistent headaches since yesterday evening.  Patient denies history of headaches.  Headache was gradual over the the evening and is located on occiput but is not unilateral and radiates to entire scalp and upper trapezius bilaterally.  Patient has tried Tylenol, ibuprofen, and warm compress without improvement.  Patient denies photophobia, aura, nausea or vomiting, change in vision, sensation of "thunderclap headache".  She would like a medication to be called in to avoid having to go to the emergency room.  Red flags reviewed.  Appears the patient may have a migrainous headache without aura.  This prescription for Imitrex 50 mg to abort headache.  Discuss specific reasons to go to the emergency room including the symptoms mentioned above or failure to abort with use of Imitrex.  Harriet Butte, [place], PGY-2

## 2017-08-27 ENCOUNTER — Ambulatory Visit (INDEPENDENT_AMBULATORY_CARE_PROVIDER_SITE_OTHER): Payer: Self-pay | Admitting: Emergency Medicine

## 2017-08-27 VITALS — BP 120/80 | HR 73 | Temp 97.6°F | Resp 16 | Wt 260.4 lb

## 2017-08-27 DIAGNOSIS — M436 Torticollis: Secondary | ICD-10-CM

## 2017-08-27 MED ORDER — METHOCARBAMOL 500 MG PO TABS: 500.0000 mg | ORAL_TABLET | Freq: Four times a day (QID) | ORAL | 0 refills | Status: DC

## 2017-08-27 MED ORDER — KETOROLAC TROMETHAMINE 10 MG PO TABS: 10.0000 mg | ORAL_TABLET | Freq: Four times a day (QID) | ORAL | 0 refills | Status: DC | PRN

## 2017-08-27 NOTE — Patient Instructions (Signed)
Acute Torticollis, Adult Torticollis is a condition in which the muscles of the neck tighten (contract) abnormally, causing the neck to twist and the head to move into an unnatural position. Torticollis that develops suddenly is called acute torticollis. People with acute torticollis may have trouble turning their head. The condition can be painful and may range from mild to severe. What are the causes? This condition may be caused by:  Sleeping in an awkward position (common).  Extending or twisting the neck muscles beyond their normal position.  An injury to the neck muscles.  An infection.  A tumor.  Certain medicines.  Long-lasting spasms of the neck muscles.  In some cases, the cause may not be known. What increases the risk? You are more likely to develop this condition if:  You have a condition associated with loose ligaments, such as Down syndrome.  You have a brain condition that affects vision, such as strabismus.  What are the signs or symptoms? The main symptom of this condition is tilting of the head to one side. Other symptoms include:  Pain in the neck.  Trouble turning the head from side to side or up and down.  How is this diagnosed? This condition may be diagnosed based on:  A physical exam.  Your medical history.  Imaging tests, such as: ? An X-ray. ? An ultrasound. ? A CT scan. ? An MRI.  How is this treated? Treatment for this condition depends on what is causing the condition. Mild cases may go away without treatment. Treatment for more serious cases may include:  Medicines or shots to relax the muscles.  Other medicines, such as antibiotics to treat the underlying cause.  Wearing a soft neck collar.  Physical therapy and stretching to improve neck strength and flexibility.  Neck massage.  In severe cases, surgery may be needed to repair dislocated or broken bones or to treat nerves in the neck. Follow these instructions at  home:  Take over-the-counter and prescription medicines only as told by your health care provider.  Do stretching exercises and massage your neck as told by your health care provider.  If directed, apply heat to the affected area as often as told by your health care provider. Use the heat source that your health care provider recommends, such as a moist heat pack or a heating pad. ? Place a towel between your skin and the heat source. ? Leave the heat on for 20-30 minutes. ? Remove the heat if your skin turns bright red. This is especially important if you are unable to feel pain, heat, or cold. You may have a greater risk of getting burned.  If you wake up with torticollis after sleeping, check your bed or sleeping area. Look for lumpy pillows or unusual objects. Make sure your bed and sleeping area are comfortable.  Keep all follow-up visits as told by your health care provider. This is important. Contact a health care provider if:  You have a fever.  Your symptoms do not improve or they get worse. Get help right away if:  You have trouble breathing.  You develop noisy breathing (stridor).  You start to drool.  You have trouble swallowing or pain when swallowing.  You develop numbness or weakness in your hands or feet.  You have changes in your speech, understanding, or vision.  You are in severe pain.  You cannot move your head or neck. Summary  Torticollis is a condition in which the muscles of the neck   tighten (contract) abnormally, causing the neck to twist and the head to move into an unnatural position. Torticollis that develops suddenly is called acute torticollis.  Treatment for this condition depends on what is causing the condition. Mild cases may go away without treatment.  Do stretching exercises and massage your neck as told by your health care provider. You may also be instructed to apply heat to the area.  Contact your health care provider if your symptoms  do not improve or they get worse. This information is not intended to replace advice given to you by your health care provider. Make sure you discuss any questions you have with your health care provider. Document Released: 07/15/2000 Document Revised: 09/15/2016 Document Reviewed: 09/15/2016 Elsevier Interactive Patient Education  2018 Elsevier Inc.  

## 2017-08-27 NOTE — Progress Notes (Signed)
Subjective:     Sheryl Cooper is a 57 y.o. female who presents for evaluation of neck pain. Event that precipitated these symptoms: possible "slept on it wrong" or from lifting her grandchild. Onset of symptoms was 5 days ago, and have been unchanged since that time. Current symptoms are stiffness in right side of her neck, pain with rightward rotation. Patient denies numbness in extremities, paresthesias in extremities and weakness in the extremities. Patient has had no prior neck problems. Previous treatments: Imitrex for possible migraine.  The following portions of the patient's history were reviewed and updated as appropriate: allergies and current medications.  Review of Systems Pertinent items are noted in HPI.    Objective:    BP 120/80 (BP Location: Right Arm, Patient Position: Sitting, Cuff Size: Large)   Pulse 73   Temp 97.6 F (36.4 C) (Oral)   Resp 16   Wt 260 lb 6.4 oz (118.1 kg)   LMP 08/17/2012   SpO2 95%   BMI 43.33 kg/m  General:   alert, cooperative and appears stated age  External Deformity:  absent  ROM Cervical Spine:  normal range of motion  Midline Tenderness:  absent midline, TTP right trapezius   Paraspinous tenderness:  absent midline  UE Neurologic Exam:  unremarkable, normal strength, normal sensation, normal reflexes, Crainial nerves II-XII grossly intact   X-ray of the cervical spine: Not indicated    Assessment:    Torticollis    Plan:    Educational material distributed. Discussed appropriate use of ice and heat. NSAIDs per medication orders. OTC analgesics as needed. Muscle relaxants started per medication orders. Follow up in  1 week. Follow up in the ER anytime symptoms worsen

## 2017-08-29 ENCOUNTER — Telehealth: Payer: Self-pay | Admitting: Emergency Medicine

## 2017-08-30 ENCOUNTER — Ambulatory Visit: Payer: 59 | Admitting: Family Medicine

## 2017-08-30 DIAGNOSIS — Z0289 Encounter for other administrative examinations: Secondary | ICD-10-CM

## 2017-08-30 NOTE — Progress Notes (Deleted)
[name] [place], [place] [address] Phone: [phone] Subjective:    I'm seeing this patient by the request  of:  Dickie La, MD   CC: Back and neck pain.  [id number]  Sheryl Cooper is a 57 y.o. female coming in with complaint of back and neck pain.  Has been seen by many providers over the course of time.  Onset-  Location Duration-  Character- Aggravating factors- Reliving factors-  Therapies tried-  Severity-     Past Medical History:  Diagnosis Date  . Abdominal distension   . Abdominal pain, left lower quadrant 02/13/2015  . Anxiety   . Arthritis   . Chest pain   . Conjunctivitis   . Episode of syncope 09/27/2010   Hx of syncope, with witnessed seizure-like activity lasting 20 minutes, and possible postictal state on 09/22/10. Evaluated in ED - Normal exam, CT head, CE, CMP, UDS, ETOH, d-dimer, UA. CBC with Hgb 10.6 (stable). EKG with prolonged QT. Marland Kitchen Because episode was possibly a seizure, guidelines state that patient should not drive for 3 months or until seen by neurology   . Hypertension   . Insomnia   . Obesity, unspecified   . PANIC ATTACK   . Restless leg syndrome    Past Surgical History:  Procedure Laterality Date  . COLONOSCOPY    . ENDOMETRIAL ABLATION    . TONSILLECTOMY  1976   Social History   Socioeconomic History  . Marital status: Legally Separated    Spouse name: Not on file  . Number of children: Not on file  . Years of education: Not on file  . Highest education level: Not on file  Social Needs  . Financial resource strain: Not on file  . Food insecurity - worry: Not on file  . Food insecurity - inability: Not on file  . Transportation needs - medical: Not on file  . Transportation needs - non-medical: Not on file  Occupational History  . Not on file  Tobacco Use  . Smoking status: Former Smoker    Last attempt to quit: 09/24/1993    Years since quitting: 23.9  . Smokeless  tobacco: Never Used  Substance and Sexual Activity  . Alcohol use: No  . Drug use: No  . Sexual activity: Yes    Birth control/protection: Post-menopausal  Other Topics Concern  . Not on file  Social History Narrative  . Not on file   Allergies  Allergen Reactions  . Diflucan [Fluconazole] Itching  . Sulfa Antibiotics Hives   Family History  Problem Relation Age of Onset  . Diabetes Mother   . Heart disease Mother   . Hypertension Mother   . Cancer Mother        breast (74), cervical (12), skin (41)  . Colon cancer Neg Hx   . Esophageal cancer Neg Hx   . Stomach cancer Neg Hx   . Rectal cancer Neg Hx      Past medical history, social, surgical and family history all reviewed in electronic medical record.  No pertanent information unless stated regarding to the chief complaint.   Review of Systems:Review of systems updated and as accurate as of 08/30/17  No headache, visual changes, nausea, vomiting, diarrhea, constipation, dizziness, abdominal pain, skin rash, fevers, chills, night sweats, weight loss, swollen lymph nodes, body aches, joint swelling, muscle aches, chest pain, shortness of breath, mood changes.   Objective  Last menstrual period 08/17/2012. Systems examined  below as of 08/30/17   General: No apparent distress alert and oriented x3 mood and affect normal, dressed appropriately.  HEENT: Pupils equal, extraocular movements intact  Respiratory: Patient's speak in full sentences and does not appear short of breath  Cardiovascular: No lower extremity edema, non tender, no erythema  Skin: Warm dry intact with no signs of infection or rash on extremities or on axial skeleton.  Abdomen: Soft nontender  Neuro: Cranial nerves II through XII are intact, neurovascularly intact in all extremities with 2+ DTRs and 2+ pulses.  Lymph: No lymphadenopathy of posterior or anterior cervical chain or axillae bilaterally.  Gait normal with good balance and coordination.  MSK:   Non tender with full range of motion and good stability and symmetric strength and tone of shoulders, elbows, wrist, hip, knee and ankles bilaterally.  Neck: Inspection unremarkable. No palpable stepoffs. Negative Spurling's maneuver. Full neck range of motion Grip strength and sensation normal in bilateral hands Strength good C4 to T1 distribution No sensory change to C4 to T1 Negative Hoffman sign bilaterally Reflexes normal  Back Exam:  Inspection: Unremarkable  Motion: Flexion 45 deg, Extension 45 deg, Side Bending to 45 deg bilaterally,  Rotation to 45 deg bilaterally  SLR laying: Negative  XSLR laying: Negative  Palpable tenderness: None. FABER: negative. Sensory change: Gross sensation intact to all lumbar and sacral dermatomes.  Reflexes: 2+ at both patellar tendons, 2+ at achilles tendons, Babinski's downgoing.  Strength at foot  Plantar-flexion: 5/5 Dorsi-flexion: 5/5 Eversion: 5/5 Inversion: 5/5  Leg strength  Quad: 5/5 Hamstring: 5/5 Hip flexor: 5/5 Hip abductors: 5/5  Gait unremarkable.    Impression and Recommendations:     This case required medical decision making of moderate complexity.      Note: This dictation was prepared with Dragon dictation along with smaller phrase technology. Any transcriptional errors that result from this process are unintentional.

## 2017-09-07 ENCOUNTER — Ambulatory Visit (HOSPITAL_COMMUNITY)
Admission: RE | Admit: 2017-09-07 | Discharge: 2017-09-07 | Disposition: A | Discharge status: Home / Self Care | Payer: 59 | Source: Ambulatory Visit | Attending: Family Medicine | Admitting: Family Medicine

## 2017-09-07 DIAGNOSIS — M170 Bilateral primary osteoarthritis of knee: Secondary | ICD-10-CM | POA: Diagnosis not present

## 2017-09-07 DIAGNOSIS — M25862 Other specified joint disorders, left knee: Secondary | ICD-10-CM | POA: Diagnosis not present

## 2017-09-07 DIAGNOSIS — M25861 Other specified joint disorders, right knee: Secondary | ICD-10-CM | POA: Diagnosis not present

## 2017-09-08 ENCOUNTER — Other Ambulatory Visit: Payer: Self-pay | Admitting: Family Medicine

## 2017-09-08 MED ORDER — METHYLPHENIDATE HCL 20 MG PO TABS: 20.0000 mg | ORAL_TABLET | Freq: Two times a day (BID) | ORAL | 0 refills | Status: DC

## 2017-09-08 MED FILL — METHYLPHENIDATE 20 MG TAB: 20 | 30 days supply | Qty: 60 | Fill #0

## 2017-09-08 NOTE — Progress Notes (Signed)
No appts available Ritalin helping some  Will do refill and she will make appt

## 2017-09-11 ENCOUNTER — Encounter: Payer: Self-pay | Admitting: Family Medicine

## 2017-09-13 ENCOUNTER — Encounter: Payer: Self-pay | Admitting: Family Medicine

## 2017-09-13 ENCOUNTER — Other Ambulatory Visit: Payer: Self-pay

## 2017-09-13 ENCOUNTER — Ambulatory Visit (INDEPENDENT_AMBULATORY_CARE_PROVIDER_SITE_OTHER): Payer: 59 | Admitting: Family Medicine

## 2017-09-13 DIAGNOSIS — F419 Anxiety disorder, unspecified: Secondary | ICD-10-CM | POA: Diagnosis not present

## 2017-09-13 DIAGNOSIS — M170 Bilateral primary osteoarthritis of knee: Secondary | ICD-10-CM

## 2017-09-13 DIAGNOSIS — F32A Depression, unspecified: Secondary | ICD-10-CM

## 2017-09-13 DIAGNOSIS — F329 Major depressive disorder, single episode, unspecified: Secondary | ICD-10-CM | POA: Diagnosis not present

## 2017-09-14 NOTE — Assessment & Plan Note (Signed)
I think is a good idea she is enrolled in the knee arthritis study.  We will see how that helps her.  Follow-up 3-6 months for that.

## 2017-09-14 NOTE — Progress Notes (Signed)
    CHIEF COMPLAINT / HPI: Medication follow-up.  We had started her on methylphenidate.  Initially it really seemed to help her productivity at work.  After few weeks she noticed it was not helping as much.  She still has improvement first thing in the morning but by 10 or 11:00 in the morning she is having difficulty focusing and organizing things.  She has had no problems with it except she takes it too late in the afternoon she has difficulty falling asleep. 2.  Chronic knee pain.  She set up to try the osteoarthritis knee brace study.  Her appointment is later today.  She is quite excited about this. 3.  Anxious depression.  She is doing much better.  She thinks that the behavioral therapy has been extremely helpful.  She continues to go once every other week.  Says she is much more at peace with herself, not binge eating as much.  REVIEW OF SYSTEMS: No unusual weight change.  No fever.  No headaches.  Denies suicidal or homicidal ideation.  No chest pain.  She does have chronic knee pain as per HPI.  Only has difficulty falling asleep as per HPI  PERTINENT  PMH / PSH: I have reviewed the patient's medications, allergies, past medical and surgical history, smoking status and updated in the EMR as appropriate.   OBJECTIVE:  Vital signs reviewed. GENERAL: Well-developed, well-nourished, no acute distress. CARDIOVASCULAR: Regular rate and rhythm no murmur gallop or rub LUNGS: Clear to auscultation bilaterally, no rales or wheeze. ABDOMEN: Soft positive bowel sounds NEURO: No gross focal neurological deficits. MSK: Movement of extremity x 4. Psychiatric: Alert and oriented x4.  Affect is interactive.  She seems much happier today than at previous office visit.  Good eye contact.  Neatly dressed.  Speech is normal in fluency and content.  Since of humor is intact.   ASSESSMENT / PLAN: Please see problem oriented charting for details

## 2017-09-14 NOTE — Assessment & Plan Note (Addendum)
Much improved.  Recommend she continue cognitive behavioral therapy. We had tried some methylphenidate which initially seemed to help.  I will change her dosing to 1 tablet at breakfast and then 1 tablet at 10:00 in the morning and see if this improves her focusing and ability to organize at work.  She will follow-up with me by phone or otherwise in the next 1-2 weeks.  Alternatively we could consider trying different medication or increasing dose.  She is open to all of the above.  Continue other medications unchanged.

## 2017-09-19 ENCOUNTER — Other Ambulatory Visit: Payer: Self-pay | Admitting: Family Medicine

## 2017-09-19 MED FILL — BUPROPION SR 150 MG TABLET: 150 | 30 days supply | Qty: 60 | Fill #1

## 2017-09-20 MED FILL — FLUoxetine HCL 20 MG CAPS: 20 | 30 days supply | Qty: 90 | Fill #0

## 2017-10-11 ENCOUNTER — Other Ambulatory Visit: Payer: Self-pay | Admitting: Family Medicine

## 2017-10-11 MED ORDER — METHYLPHENIDATE HCL 20 MG PO TABS: ORAL_TABLET | ORAL | 0 refills | Status: DC

## 2017-10-11 MED FILL — METHYLPHENIDATE 20 MG TAB: 20 | 30 days supply | Qty: 90 | Fill #0

## 2017-10-11 NOTE — Progress Notes (Signed)
AM better w 2 tabs, afternoon not as good Will try 40 am and 20 midday w f/u 3 weeks or so Sheryl Cooper

## 2017-10-12 MED FILL — PANTOPRAZOLE SOD DR 40 MG T: 40 | 90 days supply | Qty: 90 | Fill #2

## 2017-10-14 DIAGNOSIS — H5213 Myopia, bilateral: Secondary | ICD-10-CM | POA: Diagnosis not present

## 2017-10-14 DIAGNOSIS — H52223 Regular astigmatism, bilateral: Secondary | ICD-10-CM | POA: Diagnosis not present

## 2017-10-14 DIAGNOSIS — H524 Presbyopia: Secondary | ICD-10-CM | POA: Diagnosis not present

## 2017-10-16 MED FILL — ASPIRIN ADULT LOW STRENGTH: 81 | 90 days supply | Qty: 90 | Fill #1

## 2017-10-19 ENCOUNTER — Other Ambulatory Visit: Payer: Self-pay | Admitting: Family Medicine

## 2017-10-19 MED FILL — FLUoxetine HCL 20 MG CAPS: 20 | 30 days supply | Qty: 90 | Fill #1

## 2017-10-19 MED FILL — BUPROPION SR 150 MG TABLET: 150 | 30 days supply | Qty: 60 | Fill #2

## 2017-11-13 MED FILL — rOPINIRole HCL 1 MG TABS: 1 | 90 days supply | Qty: 90 | Fill #1

## 2017-11-14 ENCOUNTER — Telehealth: Payer: Self-pay | Admitting: Family Medicine

## 2017-11-14 MED ORDER — TRAMADOL HCL 50 MG PO TABS: 50.0000 mg | ORAL_TABLET | Freq: Three times a day (TID) | ORAL | 3 refills | Status: DC | PRN

## 2017-11-14 MED ORDER — METHYLPHENIDATE HCL 20 MG PO TABS: ORAL_TABLET | ORAL | 0 refills | Status: DC

## 2017-11-14 MED FILL — METHYLPHENIDATE 20 MG TAB: 20 | 30 days supply | Qty: 90 | Fill #0

## 2017-11-14 NOTE — Telephone Encounter (Signed)
-----   Message from Areatha Keas sent at 11/14/2017  8:03 AM EDT ----- Regarding: Refill Good Morning,  I need a refill on the ritalin and not if I ask for a refill on my Tramadol.  The new dose for ritalin works perfectly.  Thank you  Sheryl Cooper

## 2017-11-20 MED FILL — LOSARTAN POTASSIUM 50 MG TA: 50 | 90 days supply | Qty: 90 | Fill #2

## 2017-11-20 MED FILL — BUPROPION SR 150 MG TABLET: 150 | 30 days supply | Qty: 60 | Fill #3

## 2017-11-20 MED FILL — HYDROCHLOROTHIAZIDE 25 MG T: 25 | 90 days supply | Qty: 90 | Fill #2

## 2017-11-20 MED FILL — MELOXICAM 15 MG TABLET: 15 | 90 days supply | Qty: 90 | Fill #2

## 2017-11-20 MED FILL — FLUoxetine HCL 20 MG CAPS: 20 | 30 days supply | Qty: 90 | Fill #2

## 2017-11-29 ENCOUNTER — Telehealth: Payer: Self-pay

## 2017-11-29 MED FILL — SAXENDA 18 MG/3 ML PEN: 18 | 30 days supply | Qty: 15 | Fill #0

## 2017-11-29 NOTE — Telephone Encounter (Signed)
[facility] Requesting refill of Saxenda, 18MG  / 3ML Pen Inject 3 MG into skin every morning.  Did not see on Pt med list.  Ottis Stain, CMA

## 2017-11-30 MED ORDER — LIRAGLUTIDE -WEIGHT MANAGEMENT 18 MG/3ML ~~LOC~~ SOPN: 3.0000 mg | PEN_INJECTOR | Freq: Every day | SUBCUTANEOUS | 3 refills | Status: DC

## 2017-12-13 ENCOUNTER — Encounter (INDEPENDENT_AMBULATORY_CARE_PROVIDER_SITE_OTHER): Payer: 59

## 2017-12-18 ENCOUNTER — Other Ambulatory Visit: Payer: Self-pay | Admitting: Family Medicine

## 2017-12-18 MED FILL — traMADol HCL 50 MG TABS: 50 | 30 days supply | Qty: 90 | Fill #0

## 2017-12-20 MED ORDER — METHYLPHENIDATE HCL 20 MG PO TABS
ORAL_TABLET | ORAL | 0 refills | Status: DC
Start: 2017-12-20 — End: 2018-01-03

## 2017-12-20 MED FILL — METHYLPHENIDATE 20 MG TAB: 20 | 30 days supply | Qty: 90 | Fill #0

## 2017-12-21 ENCOUNTER — Other Ambulatory Visit: Payer: Self-pay | Admitting: Family Medicine

## 2017-12-21 MED FILL — BUPROPION SR 150 MG TABLET: 150 | 30 days supply | Qty: 60 | Fill #4

## 2017-12-21 MED FILL — FLUoxetine HCL 20 MG CAPS: 20 | 30 days supply | Qty: 90 | Fill #0

## 2017-12-28 ENCOUNTER — Ambulatory Visit (INDEPENDENT_AMBULATORY_CARE_PROVIDER_SITE_OTHER): Payer: 59 | Admitting: Family Medicine

## 2017-12-28 ENCOUNTER — Encounter (INDEPENDENT_AMBULATORY_CARE_PROVIDER_SITE_OTHER): Payer: Self-pay

## 2018-01-03 ENCOUNTER — Encounter: Payer: Self-pay | Admitting: Family Medicine

## 2018-01-03 ENCOUNTER — Ambulatory Visit (INDEPENDENT_AMBULATORY_CARE_PROVIDER_SITE_OTHER): Payer: 59 | Admitting: Family Medicine

## 2018-01-03 ENCOUNTER — Other Ambulatory Visit: Payer: Self-pay

## 2018-01-03 VITALS — BP 128/76 | HR 77 | Temp 98.6°F | Ht 65.0 in | Wt 269.8 lb

## 2018-01-03 DIAGNOSIS — F4312 Post-traumatic stress disorder, chronic: Secondary | ICD-10-CM | POA: Diagnosis not present

## 2018-01-03 DIAGNOSIS — G250 Essential tremor: Secondary | ICD-10-CM | POA: Diagnosis not present

## 2018-01-03 DIAGNOSIS — F329 Major depressive disorder, single episode, unspecified: Secondary | ICD-10-CM

## 2018-01-03 DIAGNOSIS — F32A Depression, unspecified: Secondary | ICD-10-CM

## 2018-01-03 DIAGNOSIS — R296 Repeated falls: Secondary | ICD-10-CM | POA: Diagnosis not present

## 2018-01-03 DIAGNOSIS — F419 Anxiety disorder, unspecified: Secondary | ICD-10-CM

## 2018-01-03 MED ORDER — PROPRANOLOL HCL 10 MG PO TABS: 10.0000 mg | ORAL_TABLET | Freq: Three times a day (TID) | ORAL | 0 refills | Status: DC

## 2018-01-03 MED FILL — PROPRANOLOL 10 MG TABLET: 10 | 30 days supply | Qty: 90 | Fill #0

## 2018-01-03 NOTE — Patient Instructions (Signed)
Decrease the buproprion to one tab a day I am sending you to PT for a gait eval I am adding one tab three times a day for your tremor Let me see you in 2-3 months GET BACK TO THERAPY!

## 2018-01-05 NOTE — Assessment & Plan Note (Signed)
We will try adding Inderal for treatment of essential tremor.  I think it is exacerbated by the methylphenidate or other psychotropic medications.  Also decreased her mood appropriate on the half dose of this may help as well.

## 2018-01-05 NOTE — Assessment & Plan Note (Signed)
Recommended 

## 2018-01-05 NOTE — Assessment & Plan Note (Signed)
We will decrease her bupropion.  With the addition of the increased dose of senna day and she seems to be doing much better at work are from a little concerned about this episode of increased energy alternating with increased lack of energy.  She does not have a history of mania.  We will follow this closely.  We discussed at length.  Follow-up 1 month.

## 2018-01-05 NOTE — Progress Notes (Signed)
    CHIEF COMPLAINT / HPI: Follow-up ADHD: Has changed her dose to 60 mg once in the morning and that seems to be working quite well. 2.  Depression is a little worse.  She stopped going to see her therapist she has had a few times when she seems to have extra energy is alternating with times when she is extremely fatigued.  No history of mania.  Her extra energy was mostly spent looking online at weight loss options.  She did not spend any excess money. 3.  Knee continues to be problematic.   #4.  Has had hand tremor for quite some time but over the last year or so is gotten worse.  The methylphenidate also seems to make it worse.  She thinks it is a little embarrassing at times.  Not having any voice tremor.   REVIEW OF SYSTEMS: No unusual weight change.  Denies suicidal or homicidal ideation no fever.  Sleep is generally been baseline.  See HPI.  PERTINENT  PMH / PSH: I have reviewed the patient's medications, allergies, past medical and surgical history, smoking status and updated in the EMR as appropriate.   OBJECTIVE:  Vital signs reviewed. GENERAL: Well-developed, well-nourished, no acute distress. CARDIOVASCULAR: Regular rate and rhythm no murmur gallop or rub LUNGS: Clear to auscultation bilaterally, no rales or wheeze. ABDOMEN: Soft positive bowel sounds NEURO: No gross focal neurological deficits.  Extension of hands increases hand tremor. MSK: Movement of extremity x 4. Psych: Alert and oriented x4.  Affect is a little down but interactive.  Recent remote memory is intact.  Speech is normal fluency and content.  No delusions.  No psychomotor retardation or agitation.   ASSESSMENT / PLAN: Please see problem oriented charting for details

## 2018-01-08 MED FILL — PANTOPRAZOLE SOD DR 40 MG T: 40 | 90 days supply | Qty: 90 | Fill #3

## 2018-01-19 MED FILL — ASPIRIN ADULT LOW STRENGTH: 81 | 90 days supply | Qty: 90 | Fill #2

## 2018-01-22 ENCOUNTER — Telehealth: Payer: Self-pay | Admitting: Family Medicine

## 2018-01-22 MED FILL — FLUoxetine HCL 20 MG CAPS: 20 | 30 days supply | Qty: 90 | Fill #1

## 2018-01-22 NOTE — Telephone Encounter (Signed)
-----   Message from Areatha Keas sent at 01/22/2018 11:17 AM EDT ----- Regarding: Refill Good Morning, I would like to have a refill on my Ritalin please.  Thank you  Sheryl Cooper

## 2018-01-22 NOTE — Telephone Encounter (Signed)
[name] sent last rx on June 5--I doubt they will fill early. You are taking 3 tabs in AM correct? Sheryl Cooper

## 2018-01-23 ENCOUNTER — Other Ambulatory Visit: Payer: Self-pay | Admitting: Family Medicine

## 2018-01-23 MED ORDER — ONDANSETRON HCL 4 MG PO TABS: 4.0000 mg | ORAL_TABLET | Freq: Three times a day (TID) | ORAL | 0 refills | Status: DC | PRN

## 2018-01-23 MED ORDER — METHYLPHENIDATE HCL 20 MG PO TABS: ORAL_TABLET | ORAL | 0 refills | Status: DC

## 2018-01-23 MED FILL — METHYLPHENIDATE 20 MG TAB: 20 | 30 days supply | Qty: 90 | Fill #0

## 2018-01-23 MED FILL — ONDANSETRON HCL 4 MG TABLET: 4 | 7 days supply | Qty: 20 | Fill #0

## 2018-01-24 ENCOUNTER — Encounter: Payer: 59 | Admitting: Family Medicine

## 2018-01-24 ENCOUNTER — Telehealth: Payer: 59 | Admitting: Family

## 2018-01-24 DIAGNOSIS — R112 Nausea with vomiting, unspecified: Secondary | ICD-10-CM

## 2018-01-24 NOTE — Progress Notes (Signed)
Based on what you shared with me it looks like you have a serious condition that should be evaluated in a face to face office visit.  NOTE: If you entered your credit card information for this eVisit, you will not be charged. You may see a "hold" on your card for the $30 but that hold will drop off and you will not have a charge processed.  If you are having a true medical emergency please call 911.  If you need an urgent face to face visit, [facility] has four urgent care centers for your convenience.  If you need care fast and have a high deductible or no insurance consider:   [email or web address] to reserve your spot online an avoid wait times  [facility] [address], [address] [place], [place] [address] 8 am to 8 pm Monday-Friday 10 am to 4 pm Saturday-Sunday *Across the street from [employer]  [facility]  [address] [place], [address] 8 am to 5 pm Monday-Friday * In the [facility] the [facility]   The following sites will take your  insurance:  . [facility]  [phone] Get Driving Directions Find a Provider at this Location  [address] [place], [place] [address] . 10 am to 8 pm Monday-Friday . 12 pm to 8 pm Saturday-Sunday   . [facility]  [phone] Get Driving Directions Find a Provider at this Location  [address], [address] [place], [place] [address] . 8 am to 8 pm Monday-Friday . 9 am to 6 pm Saturday . 11 am to 6 pm Sunday   .  [facility] [place]  [phone] Get Driving Directions  [address] [place], [place] [address] . 8 am to 8 pm Monday-Friday . 8 am to 4 pm Saturday-Sunday   Your e-visit answers were reviewed by a board certified advanced clinical practitioner to complete your personal care plan.  Thank you for using e-Visits.  

## 2018-01-25 ENCOUNTER — Ambulatory Visit (INDEPENDENT_AMBULATORY_CARE_PROVIDER_SITE_OTHER): Payer: 59 | Admitting: Family Medicine

## 2018-01-25 DIAGNOSIS — Z0000 Encounter for general adult medical examination without abnormal findings: Secondary | ICD-10-CM

## 2018-01-25 NOTE — Progress Notes (Signed)
    CHIEF COMPLAINT / HPI:  needed official physical for her insurance  REVIEW OF SYSTEMS: Constitutional: Negative for activity chang; no  appetite change and no unexpected weight change.  Eyes: Negative for eye pain and no visual disturbance.  Neck: denies neck pain; no swallowing problems CV: No chest pain, no shortness of breath, no lower extremity edema. No change in exercise tolerance Respiratory: Negative for cough or wheezing.  No shortness of breath. Gastrointestinal: Negative for abdominal pain, no diarrhea and no  constipation.  Genitourinary: Negative for decreased urine volume and  no difficulty urinating.  Musculoskeletal:  Positive for knee pain and weakness  Secondary to knee pain. No other unusual muscle weakness. Skin: Negative for rash.  Psychiatric/Behavioral: Negative for behavioral problems;      PERTINENT  PMH / PSH: I have reviewed the patient's medications, allergies, past medical and surgical history, smoking status and updated in the EMR as appropriate.   OBJECTIVE:  Vital signs reviewed GENERALl: Well developed, well nourished, in no acute distress. HEENT: PERRLA, EOMI, sclerae are nonicteric NECK: Supple, FROM, without lymphadenopathy.  THYROID: normal without nodularity CAROTID ARTERIES: without bruits LUNGS: clear to auscultation bilaterally. No wheezes or rales. Normal respiratory effort HEART: Regular rate and rhythm, no murmurs. Distal pulses are bilaterally symmetrical, 2+. ABDOMEN: soft with positive bowel sounds. No masses noted MSK: MOE x 4. Normal muscle strength, bulk and tone. SKIN no rash. Normal temperature. NEURO: no focal deficits. Antalgic  gait.    ASSESSMENT / PLAN: Please see problem oriented charting for details

## 2018-01-25 NOTE — Assessment & Plan Note (Addendum)
Wants pap done at [facility] office Colonoscopy due in December 2019 Considering bariatric surgery for weight loss

## 2018-02-02 ENCOUNTER — Other Ambulatory Visit (HOSPITAL_COMMUNITY): Payer: Self-pay | Admitting: General Surgery

## 2018-02-02 MED FILL — BUPROPION SR 150 MG TABLET: 150 | 30 days supply | Qty: 60 | Fill #5

## 2018-02-08 ENCOUNTER — Encounter: Payer: Self-pay | Admitting: Skilled Nursing Facility1

## 2018-02-08 ENCOUNTER — Encounter: Payer: 59 | Attending: General Surgery | Admitting: Skilled Nursing Facility1

## 2018-02-08 DIAGNOSIS — Z713 Dietary counseling and surveillance: Secondary | ICD-10-CM | POA: Insufficient documentation

## 2018-02-08 DIAGNOSIS — Z6841 Body Mass Index (BMI) 40.0 and over, adult: Secondary | ICD-10-CM | POA: Insufficient documentation

## 2018-02-08 NOTE — Progress Notes (Signed)
Pre-Op Assessment Visit:  Pre-Operative Sleeve Surgery  Medical Nutrition Therapy:  Appt start time: 3:25  End time:  4:24  Patient was seen on 02/08/2018 for Pre-Operative Nutrition Assessment. Assessment and letter of approval faxed to [facility] Bariatric Surgery Program coordinator on 02/08/2018  Pt states when she does not take her protonix she has lower back pain. Pt states she has a meal at night for comfort. Pt admits to being an emotional eater. Pt states she will come back for another appt in between now and her pre-op class to help making changes and working on emotional eating.   Pt expectation of surgery: to feel better  Pt expectation of Dietitian: none  Start weight at [facility]: 264.1 BMI: 46.78  24 hr Dietary Recall: First Meal: coffee with creamer skipped or yogurt parfait  Snack:  Second Meal: soup and sandwich or crackers and cheese Snack:  Third Meal: spagetti and potatoes  Snack: popcicles or jello Beverages: coffee, water, soda, juice  Encouraged to engage in 150 minutes of moderate physical activity including cardiovascular and weight baring weekly  Handouts given during visit include:  . Pre-Op Goals . Bariatric Surgery Protein Shakes During the appointment today the following Pre-Op Goals were reviewed with the patient: . Maintain or lose weight as instructed by your surgeon . Make healthy food choices . Begin to limit portion sizes . Limited concentrated sugars and fried foods . Keep fat/sugar in the single digits per serving on             food labels . Practice CHEWING your food  (aim for 30 chews per bite or until applesauce consistency) . Practice not drinking 15 minutes before, during, and 30 minutes after each meal/snack . Avoid all carbonated beverages  . Avoid/limit caffeinated beverages  . Avoid all sugar-sweetened beverages . Consume 3 meals per day; eat every 3-5 hours . Make a list of non-food related activities . Aim for  64-100 ounces of FLUID daily  . Aim for at least 60-80 grams of PROTEIN daily . Look for a liquid protein source that contain ?15 g protein and ?5 g carbohydrate  (ex: shakes, drinks, shots)  -Follow diet recommendations listed below   Energy and Macronutrient Recomendations: Calories: 1500 Carbohydrate: 170 Protein: 112 Fat: 42  Demonstrated degree of understanding via:  Teach Back  Teaching Method Utilized:  Visual Auditory Hands on  Barriers to learning/adherence to lifestyle change: emotional eating  Patient to call the [facility] to enroll in Pre-Op and Post-Op Nutrition Education when surgery date is scheduled.

## 2018-02-13 ENCOUNTER — Other Ambulatory Visit: Payer: Self-pay | Admitting: Family Medicine

## 2018-02-14 MED FILL — rOPINIRole HCL 1 MG TABS: 1 | 90 days supply | Qty: 90 | Fill #0

## 2018-02-22 MED FILL — HYDROCHLOROTHIAZIDE 25 MG T: 25 | 90 days supply | Qty: 90 | Fill #3

## 2018-02-22 MED FILL — MELOXICAM 15 MG TABLET: 15 | 90 days supply | Qty: 90 | Fill #3

## 2018-02-22 MED FILL — LOSARTAN POTASSIUM 50 MG TA: 50 | 90 days supply | Qty: 90 | Fill #3

## 2018-02-23 MED FILL — FLUoxetine HCL 20 MG CAPS: 20 | 30 days supply | Qty: 90 | Fill #2

## 2018-02-26 ENCOUNTER — Ambulatory Visit (HOSPITAL_COMMUNITY)
Admission: RE | Admit: 2018-02-26 | Discharge: 2018-02-26 | Disposition: A | Discharge status: Home / Self Care | Payer: 59 | Source: Ambulatory Visit | Attending: General Surgery | Admitting: General Surgery

## 2018-02-26 DIAGNOSIS — Z86010 Personal history of colonic polyps: Secondary | ICD-10-CM | POA: Insufficient documentation

## 2018-02-26 DIAGNOSIS — Z6841 Body Mass Index (BMI) 40.0 and over, adult: Secondary | ICD-10-CM | POA: Insufficient documentation

## 2018-02-26 DIAGNOSIS — M1990 Unspecified osteoarthritis, unspecified site: Secondary | ICD-10-CM | POA: Insufficient documentation

## 2018-02-26 DIAGNOSIS — F329 Major depressive disorder, single episode, unspecified: Secondary | ICD-10-CM | POA: Insufficient documentation

## 2018-02-26 DIAGNOSIS — Z8249 Family history of ischemic heart disease and other diseases of the circulatory system: Secondary | ICD-10-CM | POA: Diagnosis not present

## 2018-02-26 DIAGNOSIS — Z01818 Encounter for other preprocedural examination: Secondary | ICD-10-CM | POA: Diagnosis not present

## 2018-02-26 DIAGNOSIS — Z87891 Personal history of nicotine dependence: Secondary | ICD-10-CM | POA: Diagnosis not present

## 2018-02-26 DIAGNOSIS — I10 Essential (primary) hypertension: Secondary | ICD-10-CM | POA: Insufficient documentation

## 2018-02-26 DIAGNOSIS — Z79899 Other long term (current) drug therapy: Secondary | ICD-10-CM | POA: Diagnosis not present

## 2018-02-26 DIAGNOSIS — F419 Anxiety disorder, unspecified: Secondary | ICD-10-CM | POA: Insufficient documentation

## 2018-02-26 DIAGNOSIS — Z811 Family history of alcohol abuse and dependence: Secondary | ICD-10-CM | POA: Diagnosis not present

## 2018-02-26 DIAGNOSIS — Z8261 Family history of arthritis: Secondary | ICD-10-CM | POA: Insufficient documentation

## 2018-02-26 DIAGNOSIS — Z803 Family history of malignant neoplasm of breast: Secondary | ICD-10-CM | POA: Diagnosis not present

## 2018-02-26 DIAGNOSIS — Z7982 Long term (current) use of aspirin: Secondary | ICD-10-CM | POA: Insufficient documentation

## 2018-02-26 DIAGNOSIS — Z833 Family history of diabetes mellitus: Secondary | ICD-10-CM | POA: Diagnosis not present

## 2018-02-27 ENCOUNTER — Other Ambulatory Visit: Payer: Self-pay | Admitting: Family Medicine

## 2018-02-27 MED ORDER — METHYLPHENIDATE HCL 20 MG PO TABS
ORAL_TABLET | ORAL | 0 refills | Status: DC
Start: 2018-02-27 — End: 2018-03-29

## 2018-02-27 MED FILL — METHYLPHENIDATE 20 MG TAB: 20 | 30 days supply | Qty: 90 | Fill #0

## 2018-03-06 ENCOUNTER — Other Ambulatory Visit: Payer: Self-pay | Admitting: Family Medicine

## 2018-03-06 MED FILL — BUPROPION SR 150 MG TABLET: 150 | 30 days supply | Qty: 60 | Fill #0

## 2018-03-06 NOTE — Telephone Encounter (Signed)
We tried to decrease wellbutrin but she was more depressed so will go back to original dose Sheryl Cooper

## 2018-03-15 NOTE — Telephone Encounter (Signed)
error 

## 2018-03-20 ENCOUNTER — Telehealth: Payer: Self-pay

## 2018-03-20 MED FILL — traMADol HCL 50 MG TABS: 50 | 30 days supply | Qty: 90 | Fill #1

## 2018-03-20 NOTE — Telephone Encounter (Signed)
Received fax from pharmacy, PA needed on Saxenda.  Clinical questions printed and placed in MD box.  Wallace Cullens, RN

## 2018-03-21 ENCOUNTER — Encounter: Payer: Self-pay | Admitting: Family Medicine

## 2018-03-21 NOTE — Progress Notes (Signed)
Prior auth Wt loss in 2017 on Saxenda---260 down to 243 = 17 pounds is 6.5% loss wt Filled out

## 2018-03-22 NOTE — Telephone Encounter (Signed)
PA for Saxenda submitted via CoverMyMeds. Status pending will recheck in 24 hours. Wallace Cullens, RN

## 2018-03-23 MED FILL — SAXENDA 18 MG/3 ML PEN: 18 | 30 days supply | Qty: 15 | Fill #0

## 2018-03-23 NOTE — Telephone Encounter (Signed)
PA for Saxenda approved from 03/22/18-03/22/19. [facility] pharmacy aware.  Wallace Cullens, RN

## 2018-03-29 ENCOUNTER — Telehealth: Payer: Self-pay | Admitting: Family Medicine

## 2018-03-29 ENCOUNTER — Other Ambulatory Visit: Payer: Self-pay | Admitting: Family Medicine

## 2018-03-29 MED ORDER — METHYLPHENIDATE HCL 20 MG PO TABS
ORAL_TABLET | ORAL | 0 refills | Status: DC
Start: 2018-03-29 — End: 2018-05-04

## 2018-03-29 MED FILL — FLUoxetine HCL 20 MG CAPS: 20 | 30 days supply | Qty: 90 | Fill #0

## 2018-03-29 MED FILL — METHYLPHENIDATE 20 MG TAB: 20 | 30 days supply | Qty: 90 | Fill #0

## 2018-03-29 NOTE — Telephone Encounter (Signed)
-----   Message from Areatha Keas sent at 03/29/2018 10:57 AM EDT ----- Regarding: Refill Dr. Nori Riis,  I need a refill on my Ritalin sent in please.  [name]

## 2018-04-03 ENCOUNTER — Ambulatory Visit: Payer: Self-pay | Admitting: Skilled Nursing Facility1

## 2018-04-05 ENCOUNTER — Ambulatory Visit: Payer: Self-pay | Admitting: Skilled Nursing Facility1

## 2018-04-12 MED FILL — BUPROPION SR 150 MG TABLET: 150 | 30 days supply | Qty: 60 | Fill #1

## 2018-04-14 ENCOUNTER — Telehealth: Payer: 59 | Admitting: Nurse Practitioner

## 2018-04-14 DIAGNOSIS — J029 Acute pharyngitis, unspecified: Secondary | ICD-10-CM | POA: Diagnosis not present

## 2018-04-14 NOTE — Progress Notes (Signed)
We are sorry that you are not feeling well.  Here is how we plan to help!  Based on what you have shared with me it looks like you have a condition called pharyngitis.  Pharyngitis is inflammation in the back of the throat which can cause a sore throat, scratchiness and sometimes difficulty swallowing.   Pharyngitis is typically caused by a respiratory virus and will just run its course.  Please keep in mind that your symptoms could last up to 10 days.  For throat pain, we recommend over the counter oral pain relief medications such as acetaminophen or aspirin, or anti-inflammatory medications such as ibuprofen or naproxen sodium.  Topical treatments such as oral throat lozenges or sprays may be used as needed.  Avoid close contact with loved ones, especially the very young and elderly.  Remember to wash your hands thoroughly throughout the day as this is the number one way tot prevent the spread of infection and wipe down door knobs and counters with disinfectant.  After careful review of your answers, I would not recommend and antibiotic for your condition.  Antibiotics should not be used to treat conditions that we suspect are caused by viruses like the virus that causes the common cold or flu.  Providers prescribe antibiotics to treat infections caused by bacteria. Antibiotics are very powerful in treating bacterial infections when they are used properly.  To maintain their effectiveness, they should be used only when necessary.  Overuse of antibiotics has resulted in the development of super bugs that are resistant to treatment!    Home Care:  Only take medications as instructed by your medical team.  Do not drink alcohol while taking these medications.  A steam or ultrasonic humidifier can help congestion.  You can place a towel over your head and breathe in the steam from hot water coming from a faucet.  Avoid close contacts especially the very young and the elderly.  Cover your mouth when  you cough or sneeze.  Always remember to wash your hands.  Get Help Right Away If:  You develop worsening fever or throat pain.  You develop a severe head ache or visual changes.  Your symptoms persist after you have completed your treatment plan.  Make sure you  Understand these instructions.  Will watch your condition.  Will get help right away if you are not doing well or get worse.  Your e-visit answers were reviewed by a board certified advanced clinical practitioner to complete your personal care plan.  Depending on the condition, your plan could have included both over the counter or prescription medications.  If there is a problem please reply  once you have received a response from your provider.  Your safety is important to us.  If you have drug allergies check your prescription carefully.    You can use MyChart to ask questions about todays visit, request a non-urgent call back, or ask for a work or school excuse for 24 hours related to this e-Visit. If it has been greater than 24 hours you will need to follow up with your provider, or enter a new e-Visit to address those concerns.  You will get an e-mail in the next two days asking about your experience.  I hope that your e-visit has been valuable and will speed your recovery. Thank you for using e-visits.    

## 2018-04-19 ENCOUNTER — Ambulatory Visit (INDEPENDENT_AMBULATORY_CARE_PROVIDER_SITE_OTHER): Payer: 59 | Admitting: Psychiatry

## 2018-04-19 DIAGNOSIS — F509 Eating disorder, unspecified: Secondary | ICD-10-CM | POA: Diagnosis not present

## 2018-04-20 ENCOUNTER — Other Ambulatory Visit: Payer: Self-pay | Admitting: Family Medicine

## 2018-04-20 DIAGNOSIS — R109 Unspecified abdominal pain: Secondary | ICD-10-CM

## 2018-04-20 DIAGNOSIS — K8070 Calculus of gallbladder and bile duct without cholecystitis without obstruction: Secondary | ICD-10-CM

## 2018-04-20 DIAGNOSIS — R1013 Epigastric pain: Secondary | ICD-10-CM

## 2018-04-20 DIAGNOSIS — R140 Abdominal distension (gaseous): Secondary | ICD-10-CM

## 2018-04-20 MED FILL — PANTOPRAZOLE SOD DR 40 MG T: 40 | 90 days supply | Qty: 90 | Fill #0

## 2018-04-24 ENCOUNTER — Telehealth: Payer: 59 | Admitting: Family

## 2018-04-24 DIAGNOSIS — M546 Pain in thoracic spine: Secondary | ICD-10-CM

## 2018-04-24 MED ORDER — CYCLOBENZAPRINE HCL 5 MG PO TABS: 5.0000 mg | ORAL_TABLET | Freq: Three times a day (TID) | ORAL | 0 refills | Status: DC | PRN

## 2018-04-24 MED ORDER — NAPROXEN 500 MG PO TABS: 500.0000 mg | ORAL_TABLET | Freq: Two times a day (BID) | ORAL | 0 refills | Status: DC

## 2018-04-24 MED FILL — NAPROXEN 500 MG TABLET: 500 | 15 days supply | Qty: 30 | Fill #0

## 2018-04-24 MED FILL — CYCLOBENZAPRINE 5 MG TABLET: 5 | 10 days supply | Qty: 30 | Fill #0

## 2018-04-24 NOTE — Progress Notes (Signed)

## 2018-04-26 MED FILL — SAXENDA 18 MG/3 ML PEN: 18 | 30 days supply | Qty: 15 | Fill #1

## 2018-05-02 ENCOUNTER — Encounter: Payer: Self-pay | Admitting: Family Medicine

## 2018-05-02 ENCOUNTER — Other Ambulatory Visit: Payer: Self-pay

## 2018-05-02 ENCOUNTER — Ambulatory Visit (INDEPENDENT_AMBULATORY_CARE_PROVIDER_SITE_OTHER): Payer: 59 | Admitting: Family Medicine

## 2018-05-02 VITALS — BP 116/60 | HR 86 | Temp 98.2°F | Ht 65.0 in | Wt 274.2 lb

## 2018-05-02 DIAGNOSIS — M25512 Pain in left shoulder: Secondary | ICD-10-CM | POA: Diagnosis not present

## 2018-05-02 MED ORDER — METHYLPREDNISOLONE ACETATE 40 MG/ML IJ SUSP
40.0000 mg | Freq: Once | INTRAMUSCULAR | Status: AC
  Administered 2018-05-02: 40 mg via INTRAMUSCULAR

## 2018-05-02 MED FILL — ASPIRIN ADULT LOW STRENGTH: 81 | 90 days supply | Qty: 90 | Fill #3

## 2018-05-02 MED FILL — FLUoxetine HCL 20 MG CAPS: 20 | 30 days supply | Qty: 90 | Fill #1

## 2018-05-02 MED FILL — traMADol HCL 50 MG TABS: 50 | 30 days supply | Qty: 90 | Fill #2

## 2018-05-04 ENCOUNTER — Other Ambulatory Visit: Payer: Self-pay | Admitting: Family Medicine

## 2018-05-04 MED ORDER — METHYLPHENIDATE HCL 20 MG PO TABS: ORAL_TABLET | ORAL | 0 refills | Status: DC

## 2018-05-04 MED ORDER — FLUTICASONE PROPIONATE 50 MCG/ACT NA SUSP: 2.0000 | Freq: Every day | NASAL | 6 refills | Status: DC

## 2018-05-04 MED FILL — FLUTICASONE PROP 50 MCG SPR: 50 | 30 days supply | Qty: 16 | Fill #0

## 2018-05-04 MED FILL — METHYLPHENIDATE 20 MG TAB: 20 | 30 days supply | Qty: 90 | Fill #0

## 2018-05-04 NOTE — Progress Notes (Signed)
    CHIEF COMPLAINT / HPI:  LEFT shoulder pain x 3-4 weeks. Insiduous onset. Cannot raise arm above shoulder height without pain. Aches at night.No specific injury. Nothing seems to make it better. No UE numbness or weakness.  REVIEW OF SYSTEMS: See HPI  PERTINENT  PMH / PSH: I have reviewed the patient's medications, allergies, past medical and surgical history, smoking status and updated in the EMR as appropriate.   OBJECTIVE:  GEN WD WN NAD CV RRR RESP: normal work of breathing SHOULDER: left: mild TTP over bicep tendon in grrove, Has FROm but pain with ABduction above 110 and forward flexion above 110. IR /ER mildy painful but full. Strength limited only by pain  PROCEDURE: INJECTION: Patient was given informed consent, signed copy in the chart. Appropriate time out was taken. Area prepped and draped in usual sterile fashion. Ethyl chloride was  used for local anesthesia. A 21 gauge 1 1/2 inch needle was used.. 1 cc of methylprednisolone 40 mg/ml plus  4 cc of 1% lidocaine without epinephrine was injected into the LFT subacromial bursa using a(n) posterior approach.   The patient tolerated the procedure well. There were no complications. Post procedure instructions were given.   ASSESSMENT / PLAN:  Rotator cuff syndrome. Will start with CSI today. F/u via message in 1 week and if some improvement will start HEP.

## 2018-05-07 ENCOUNTER — Encounter: Payer: Self-pay | Admitting: Skilled Nursing Facility1

## 2018-05-07 ENCOUNTER — Encounter: Payer: 59 | Attending: General Surgery | Admitting: Skilled Nursing Facility1

## 2018-05-07 DIAGNOSIS — Z6841 Body Mass Index (BMI) 40.0 and over, adult: Secondary | ICD-10-CM | POA: Insufficient documentation

## 2018-05-07 DIAGNOSIS — Z713 Dietary counseling and surveillance: Secondary | ICD-10-CM | POA: Diagnosis not present

## 2018-05-07 NOTE — Progress Notes (Signed)
Pre-Operative Nutrition Class:  Appt start time: 4469   End time:  1830.  Patient was seen on 05/07/2018 for Pre-Operative Bariatric Surgery Education [facility]   Surgery date:  Surgery type: sleeve Start weight at [facility]: 264.1 Weight today: 264.4  Samples given per MNT protocol. Patient educated on appropriate usage: Bariatric Advantage Multivitamin Lot # [id number] Exp: 10/20  Bariatric Advantage Calcium  Lot # [id number] Exp: 03/24/19  Unjury Protein Shake Lot # [id number] Exp: 10/06/18  The following the learning objectives were met by the patient during this course:  Identify Pre-Op Dietary Goals and will begin 2 weeks pre-operatively  Identify appropriate sources of fluids and proteins   State protein recommendations and appropriate sources pre and post-operatively  Identify Post-Operative Dietary Goals and will follow for 2 weeks post-operatively  Identify appropriate multivitamin and calcium sources  Describe the need for physical activity post-operatively and will follow MD recommendations  State when to call healthcare provider regarding medication questions or post-operative complications  Handouts given during class include:  Pre-Op Bariatric Surgery Diet Handout  Protein Shake Handout  Post-Op Bariatric Surgery Nutrition Handout  [facility] Program Information Flyer  Support Group Information Flyer  [facility] Bariatric Supplements Price List  Follow-Up Plan: [facility] per MD.

## 2018-05-10 ENCOUNTER — Ambulatory Visit: Payer: 59 | Admitting: Psychiatry

## 2018-05-10 ENCOUNTER — Ambulatory Visit (INDEPENDENT_AMBULATORY_CARE_PROVIDER_SITE_OTHER): Payer: 59 | Admitting: Psychiatry

## 2018-05-10 DIAGNOSIS — F509 Eating disorder, unspecified: Secondary | ICD-10-CM | POA: Diagnosis not present

## 2018-05-11 MED FILL — rOPINIRole HCL 1 MG TABS: 1 | 90 days supply | Qty: 90 | Fill #1

## 2018-05-15 MED FILL — BUPROPION SR 150 MG TABLET: 150 | 30 days supply | Qty: 60 | Fill #2

## 2018-05-30 ENCOUNTER — Other Ambulatory Visit: Payer: Self-pay | Admitting: Family Medicine

## 2018-05-30 MED FILL — HYDROCHLOROTHIAZIDE 25 MG T: 25 | 90 days supply | Qty: 90 | Fill #0

## 2018-05-30 MED FILL — FLUoxetine HCL 20 MG CAPS: 20 | 30 days supply | Qty: 90 | Fill #2

## 2018-05-30 MED FILL — MELOXICAM 15 MG TABLET: 15 | 90 days supply | Qty: 90 | Fill #0

## 2018-06-06 ENCOUNTER — Other Ambulatory Visit: Payer: Self-pay | Admitting: Family Medicine

## 2018-06-06 MED ORDER — METHYLPHENIDATE HCL 20 MG PO TABS: ORAL_TABLET | ORAL | 0 refills | Status: DC

## 2018-06-06 MED FILL — METHYLPHENIDATE 20 MG TAB: 20 | 30 days supply | Qty: 90 | Fill #0

## 2018-06-06 NOTE — Progress Notes (Signed)
Needs labs for bariatric surgery eval Also needs refil ritalin

## 2018-06-07 ENCOUNTER — Other Ambulatory Visit: Payer: 59

## 2018-06-08 ENCOUNTER — Encounter: Payer: Self-pay | Admitting: Family Medicine

## 2018-06-10 LAB — CBC
HEMOGLOBIN: 10.9 g/dL — AB (ref 11.1–15.9)
Hematocrit: 34.9 % (ref 34.0–46.6)
MCH: 25.6 pg — AB (ref 26.6–33.0)
MCHC: 31.2 g/dL — ABNORMAL LOW (ref 31.5–35.7)
MCV: 82 fL (ref 79–97)
Platelets: 306 10*3/uL (ref 150–450)
RBC: 4.26 x10E6/uL (ref 3.77–5.28)
RDW: 14.5 % (ref 12.3–15.4)
WBC: 6.5 10*3/uL (ref 3.4–10.8)

## 2018-06-10 LAB — CMP14+EGFR
A/G RATIO: 1.5 (ref 1.2–2.2)
ALK PHOS: 89 IU/L (ref 39–117)
ALT: 13 IU/L (ref 0–32)
AST: 27 IU/L (ref 0–40)
Albumin: 4 g/dL (ref 3.5–5.5)
BUN/Creatinine Ratio: 24 — ABNORMAL HIGH (ref 9–23)
BUN: 22 mg/dL (ref 6–24)
Bilirubin Total: 0.2 mg/dL (ref 0.0–1.2)
CO2: 24 mmol/L (ref 20–29)
Calcium: 9 mg/dL (ref 8.7–10.2)
Chloride: 103 mmol/L (ref 96–106)
Creatinine, Ser: 0.91 mg/dL (ref 0.57–1.00)
GFR calc Af Amer: 80 mL/min/{1.73_m2} (ref >59)
GFR calc non Af Amer: 69 mL/min/{1.73_m2} (ref >59)
GLOBULIN, TOTAL: 2.7 g/dL (ref 1.5–4.5)
Glucose: 99 mg/dL (ref 65–99)
POTASSIUM: 4.2 mmol/L (ref 3.5–5.2)
SODIUM: 140 mmol/L (ref 134–144)
Total Protein: 6.7 g/dL (ref 6.0–8.5)

## 2018-06-10 LAB — LIPID PANEL
CHOLESTEROL TOTAL: 184 mg/dL (ref 100–199)
Chol/HDL Ratio: 3.1 ratio (ref 0.0–4.4)
HDL: 60 mg/dL (ref >39)
LDL Calculated: 103 mg/dL — ABNORMAL HIGH (ref 0–99)
Triglycerides: 106 mg/dL (ref 0–149)
VLDL Cholesterol Cal: 21 mg/dL (ref 5–40)

## 2018-06-10 LAB — IRON AND TIBC
IRON SATURATION: 7 % — AB (ref 15–55)
IRON: 28 ug/dL (ref 27–159)
TIBC: 379 ug/dL (ref 250–450)
UIBC: 351 ug/dL (ref 131–425)

## 2018-06-10 LAB — VITAMIN D 25 HYDROXY (VIT D DEFICIENCY, FRACTURES): Vit D, 25-Hydroxy: 9.9 ng/mL — ABNORMAL LOW (ref 30.0–100.0)

## 2018-06-10 LAB — TSH: TSH: 1.53 u[IU]/mL (ref 0.450–4.500)

## 2018-06-10 LAB — VITAMIN B12: Vitamin B-12: 1508 pg/mL — ABNORMAL HIGH (ref 232–1245)

## 2018-06-10 LAB — VITAMIN B1: Thiamine: 115.8 nmol/L (ref 66.5–200.0)

## 2018-06-12 MED FILL — BUPROPION SR 150 MG TABLET: 150 | 30 days supply | Qty: 60 | Fill #3

## 2018-06-13 ENCOUNTER — Other Ambulatory Visit: Payer: Self-pay | Admitting: *Deleted

## 2018-06-13 MED FILL — SAXENDA 18 MG/3 ML PEN: 18 | 30 days supply | Qty: 15 | Fill #2

## 2018-06-15 ENCOUNTER — Other Ambulatory Visit: Payer: Self-pay | Admitting: Family Medicine

## 2018-06-15 MED ORDER — LOSARTAN POTASSIUM 50 MG PO TABS: 50.0000 mg | ORAL_TABLET | Freq: Every day | ORAL | 3 refills | Status: DC

## 2018-06-15 MED FILL — LOSARTAN POTASSIUM 50 MG TA: 50 | 90 days supply | Qty: 90 | Fill #0

## 2018-06-18 NOTE — Progress Notes (Signed)
Need orders in epic.  Preop on 06/21/2018.  Surgery on 06/25/2018.

## 2018-06-19 ENCOUNTER — Ambulatory Visit: Payer: Self-pay | Admitting: General Surgery

## 2018-06-20 NOTE — Patient Instructions (Addendum)
Sheryl Cooper  06/20/2018   Your procedure is scheduled on: 06-25-18  Report to [facility] Main  Entrance  Report to admitting at 1015 AM    Call this number if you have problems the morning of surgery [phone]   Remember: Do not eat food :After 600 pm night before surgery. BRUSH YOUR TEETH MORNING OF SURGERY AND RINSE YOUR MOUTH OUT, NO CHEWING GUM CANDY OR MINTS.  MORNING OF SURGERY DRINK:  1SHAKE BEFORE YOU LEAVE HOME, DRINK ALL OF THE SHAKE AT ONE TIME.   NO SOLID FOOD AFTER 600 PM THE NIGHT BEFORE YOUR SURGERY. YOU MAY DRINK CLEAR FLUIDS. THE SHAKE YOU DRINK BEFORE YOU LEAVE HOME WILL BE THE LAST FLUIDS YOU DRINK BEFORE SURGERY. DRINK PRE SURGERY SHAKE AT 2 AM.  PAIN IS EXPECTED AFTER SURGERY AND WILL NOT BE COMPLETELY ELIMINATED. AMBULATION AND TYLENOL WILL HELP REDUCE INCISIONAL AND GAS PAIN. MOVEMENT IS KEY!  YOU ARE EXPECTED TO BE OUT OF BED WITHIN 4 HOURS OF ADMISSION TO YOUR PATIENT ROOM.  SITTING IN THE RECLINER THROUGHOUT THE DAY IS IMPORTANT FOR DRINKING FLUIDS AND MOVING GAS THROUGHOUT THE GI TRACT.  COMPRESSION STOCKINGS SHOULD BE WORN Sheryl Cooper UNLESS YOU ARE WALKING.   INCENTIVE SPIROMETER SHOULD BE USED EVERY HOUR WHILE AWAKE TO DECREASE POST-OPERATIVE COMPLICATIONS SUCH AS PNEUMONIA.  WHEN DISCHARGED HOME, IT IS IMPORTANT TO CONTINUE TO WALK EVERY HOUR AND USE THE INCENTIVE SPIROMETER EVERY HOUR.      CLEAR LIQUID DIET   Foods Allowed                                                                     Foods Excluded  Coffee and tea, regular and decaf                             liquids that you cannot  Plain Jell-O in any flavor                                             see through such as: Fruit ices (not with fruit pulp)                                     milk, soups, orange juice  Iced Popsicles                                    All solid food Carbonated beverages, regular and diet                                     Cranberry, grape and apple juices Sports drinks like Gatorade Lightly seasoned clear broth or consume(fat free) Sugar, honey syrup  Sample Menu Breakfast  Lunch                                     Supper Cranberry juice                    Beef broth                            Chicken broth Jell-O                                     Grape juice                           Apple juice Coffee or tea                        Jell-O                                      Popsicle                                                Coffee or tea                        Coffee or tea  _____________________________________________________________________         Take these medicines the morning of surgery with A SIP OF WATER: BUPROPION (WELLBUTRIN SR), FLUOXETINE (PROZAC), PANTAPRAZOLE (PROTONIX)              You may not have any metal on your body including hair pins and              piercings  Do not wear jewelry, make-up, lotions, powders or perfumes, deodorant             Do not wear nail polish.  Do not shave  48 hours prior to surgery.              Men may shave face and neck.   Do not bring valuables to the hospital. [place].  Contacts, dentures or bridgework may not be worn into surgery.  Leave suitcase in the car. After surgery it may be brought to your room.                  Please read over the following fact sheets you were given: _____________________________________________________________________  [facility] - Preparing for Surgery Before surgery, you can play an important role.  Because skin is not sterile, your skin needs to be as free of germs as possible.  You can reduce the number of germs on your skin by washing with CHG (chlorahexidine gluconate) soap before surgery.  CHG is an antiseptic cleaner which kills germs and bonds with the skin to continue killing germs even after  washing. Please DO NOT use if you have an allergy to CHG or antibacterial soaps.  If your skin  becomes reddened/irritated stop using the CHG and inform your nurse when you arrive at Short Stay. Do not shave (including legs and underarms) for at least 48 hours prior to the first CHG shower.  You may shave your face/neck. Please follow these instructions carefully:  1.  Shower with CHG Soap the night before surgery and the  morning of Surgery.  2.  If you choose to wash your hair, wash your hair first as usual with your  normal  shampoo.  3.  After you shampoo, rinse your hair and body thoroughly to remove the  shampoo.                           4.  Use CHG as you would any other liquid soap.  You can apply chg directly  to the skin and wash                       Gently with a scrungie or clean washcloth.  5.  Apply the CHG Soap to your body ONLY FROM THE NECK DOWN.   Do not use on face/ open                           Wound or open sores. Avoid contact with eyes, ears mouth and genitals (private parts).                       Wash face,  Genitals (private parts) with your normal soap.             6.  Wash thoroughly, paying special attention to the area where your surgery  will be performed.  7.  Thoroughly rinse your body with warm water from the neck down.  8.  DO NOT shower/wash with your normal soap after using and rinsing off  the CHG Soap.                9.  Pat yourself dry with a clean towel.            10.  Wear clean pajamas.            11.  Place clean sheets on your bed the night of your first shower and do not  sleep with pets. Day of Surgery : Do not apply any lotions/deodorants the morning of surgery.  Please wear clean clothes to the hospital/surgery center.  FAILURE TO FOLLOW THESE INSTRUCTIONS MAY RESULT IN THE CANCELLATION OF YOUR SURGERY PATIENT SIGNATURE_________________________________  NURSE  SIGNATURE__________________________________  ________________________________________________________________________   Sheryl Cooper  An incentive spirometer is a tool that can help keep your lungs clear and active. This tool measures how well you are filling your lungs with each breath. Taking long deep breaths may help reverse or decrease the chance of developing breathing (pulmonary) problems (especially infection) following:  A long period of time when you are unable to move or be active. BEFORE THE PROCEDURE   If the spirometer includes an indicator to show your best effort, your nurse or respiratory therapist will set it to a desired goal.  If possible, sit up straight or lean slightly forward. Try not to slouch.  Hold the incentive spirometer in an upright position. INSTRUCTIONS FOR USE  1. Sit on the edge of your bed if possible, or sit up as far as you can in bed or on a chair. 2. Hold  the incentive spirometer in an upright position. 3. Breathe out normally. 4. Place the mouthpiece in your mouth and seal your lips tightly around it. 5. Breathe in slowly and as deeply as possible, raising the piston or the ball toward the top of the column. 6. Hold your breath for 3-5 seconds or for as long as possible. Allow the piston or ball to fall to the bottom of the column. 7. Remove the mouthpiece from your mouth and breathe out normally. 8. Rest for a few seconds and repeat Steps 1 through 7 at least 10 times every 1-2 hours when you are awake. Take your time and take a few normal breaths between deep breaths. 9. The spirometer may include an indicator to show your best effort. Use the indicator as a goal to work toward during each repetition. 10. After each set of 10 deep breaths, practice coughing to be sure your lungs are clear. If you have an incision (the cut made at the time of surgery), support your incision when coughing by placing a pillow or rolled up towels firmly  against it. Once you are able to get out of bed, walk around indoors and cough well. You may stop using the incentive spirometer when instructed by your caregiver.  RISKS AND COMPLICATIONS  Take your time so you do not get dizzy or light-headed.  If you are in pain, you may need to take or ask for pain medication before doing incentive spirometry. It is harder to take a deep breath if you are having pain. AFTER USE  Rest and breathe slowly and easily.  It can be helpful to keep track of a log of your progress. Your caregiver can provide you with a simple table to help with this. If you are using the spirometer at home, follow these instructions: [place] IF:   You are having difficultly using the spirometer.  You have trouble using the spirometer as often as instructed.  Your pain medication is not giving enough relief while using the spirometer.  You develop fever of 100.5 F (38.1 C) or higher. SEEK IMMEDIATE MEDICAL CARE IF:   You cough up bloody sputum that had not been present before.  You develop fever of 102 F (38.9 C) or greater.  You develop worsening pain at or near the incision site. MAKE SURE YOU:   Understand these instructions.  Will watch your condition.  Will get help right away if you are not doing well or get worse. Document Released: 11/28/2006 Document Revised: 10/10/2011 Document Reviewed: 01/29/2007 ExitCare Patient Information 2014 Exit[employer], [place].   ________________________________________________________________________  WHAT IS A BLOOD TRANSFUSION? Blood Transfusion Information  A transfusion is the replacement of blood or some of its parts. Blood is made up of multiple cells which provide different functions.  Red blood cells carry oxygen and are used for blood loss replacement.  White blood cells fight against infection.  Platelets control bleeding.  Plasma helps clot blood.  Other blood products are available for  specialized needs, such as hemophilia or other clotting disorders. BEFORE THE TRANSFUSION  Who gives blood for transfusions?   Healthy volunteers who are fully evaluated to make sure their blood is safe. This is blood bank blood. Transfusion therapy is the safest it has ever been in the practice of medicine. Before blood is taken from a donor, a complete history is taken to make sure that person has no history of diseases nor engages in risky social behavior (examples are intravenous drug  use or sexual activity with multiple partners). The donor's travel history is screened to minimize risk of transmitting infections, such as malaria. The donated blood is tested for signs of infectious diseases, such as HIV and hepatitis. The blood is then tested to be sure it is compatible with you in order to minimize the chance of a transfusion reaction. If you or a relative donates blood, this is often done in anticipation of surgery and is not appropriate for emergency situations. It takes many days to process the donated blood. RISKS AND COMPLICATIONS Although transfusion therapy is very safe and saves many lives, the main dangers of transfusion include:   Getting an infectious disease.  Developing a transfusion reaction. This is an allergic reaction to something in the blood you were given. Every precaution is taken to prevent this. The decision to have a blood transfusion has been considered carefully by your caregiver before blood is given. Blood is not given unless the benefits outweigh the risks. AFTER THE TRANSFUSION  Right after receiving a blood transfusion, you will usually feel much better and more energetic. This is especially true if your red blood cells have gotten low (anemic). The transfusion raises the level of the red blood cells which carry oxygen, and this usually causes an energy increase.  The nurse administering the transfusion will monitor you carefully for complications. HOME CARE  INSTRUCTIONS  No special instructions are needed after a transfusion. You may find your energy is better. Speak with your caregiver about any limitations on activity for underlying diseases you may have. SEEK MEDICAL CARE IF:   Your condition is not improving after your transfusion.  You develop redness or irritation at the intravenous (IV) site. SEEK IMMEDIATE MEDICAL CARE IF:  Any of the following symptoms occur over the next 12 hours:  Shaking chills.  You have a temperature by mouth above 102 F (38.9 C), not controlled by medicine.  Chest, back, or muscle pain.  People around you feel you are not acting correctly or are confused.  Shortness of breath or difficulty breathing.  Dizziness and fainting.  You get a rash or develop hives.  You have a decrease in urine output.  Your urine turns a dark color or changes to pink, red, or brown. Any of the following symptoms occur over the next 10 days:  You have a temperature by mouth above 102 F (38.9 C), not controlled by medicine.  Shortness of breath.  Weakness after normal activity.  The white part of the eye turns yellow (jaundice).  You have a decrease in the amount of urine or are urinating less often.  Your urine turns a dark color or changes to pink, red, or brown. Document Released: 07/15/2000 Document Revised: 10/10/2011 Document Reviewed: 03/03/2008 [facility] Patient Information 2014 [place], [place].  _______________________________________________________________________

## 2018-06-21 ENCOUNTER — Other Ambulatory Visit: Payer: Self-pay

## 2018-06-21 ENCOUNTER — Encounter (HOSPITAL_COMMUNITY)
Admission: RE | Admit: 2018-06-21 | Discharge: 2018-06-21 | Disposition: A | Discharge status: Home / Self Care | Payer: 59 | Source: Ambulatory Visit | Attending: General Surgery | Admitting: General Surgery

## 2018-06-21 ENCOUNTER — Encounter (HOSPITAL_COMMUNITY): Payer: Self-pay

## 2018-06-21 DIAGNOSIS — I10 Essential (primary) hypertension: Secondary | ICD-10-CM | POA: Insufficient documentation

## 2018-06-21 DIAGNOSIS — Z01818 Encounter for other preprocedural examination: Secondary | ICD-10-CM | POA: Insufficient documentation

## 2018-06-21 HISTORY — DX: Gastro-esophageal reflux disease without esophagitis: K21.9

## 2018-06-21 LAB — CBC WITH DIFFERENTIAL/PLATELET
ABS IMMATURE GRANULOCYTES: 0.02 10*3/uL (ref 0.00–0.07)
BASOS ABS: 0.1 10*3/uL (ref 0.0–0.1)
Basophils Relative: 1 %
Eosinophils Absolute: 0.1 10*3/uL (ref 0.0–0.5)
Eosinophils Relative: 1 %
HEMATOCRIT: 39.8 % (ref 36.0–46.0)
HEMOGLOBIN: 12 g/dL (ref 12.0–15.0)
IMMATURE GRANULOCYTES: 0 %
LYMPHS ABS: 1.6 10*3/uL (ref 0.7–4.0)
LYMPHS PCT: 21 %
MCH: 26.1 pg (ref 26.0–34.0)
MCHC: 30.2 g/dL (ref 30.0–36.0)
MCV: 86.7 fL (ref 80.0–100.0)
Monocytes Absolute: 0.5 10*3/uL (ref 0.1–1.0)
Monocytes Relative: 6 %
NEUTROS ABS: 5.3 10*3/uL (ref 1.7–7.7)
NEUTROS PCT: 71 %
Platelets: 305 10*3/uL (ref 150–400)
RBC: 4.59 MIL/uL (ref 3.87–5.11)
RDW: 15.4 % (ref 11.5–15.5)
WBC: 7.5 10*3/uL (ref 4.0–10.5)
nRBC: 0 % (ref 0.0–0.2)

## 2018-06-21 LAB — COMPREHENSIVE METABOLIC PANEL
ALBUMIN: 4.2 g/dL (ref 3.5–5.0)
ALT: 18 U/L (ref 0–44)
AST: 32 U/L (ref 15–41)
Alkaline Phosphatase: 75 U/L (ref 38–126)
Anion gap: 10 (ref 5–15)
BILIRUBIN TOTAL: 0.9 mg/dL (ref 0.3–1.2)
BUN: 32 mg/dL — AB (ref 6–20)
CO2: 25 mmol/L (ref 22–32)
Calcium: 9.2 mg/dL (ref 8.9–10.3)
Chloride: 104 mmol/L (ref 98–111)
Creatinine, Ser: 1 mg/dL (ref 0.44–1.00)
GFR calc Af Amer: >60 mL/min (ref >60)
GFR calc non Af Amer: >60 mL/min (ref >60)
GLUCOSE: 87 mg/dL (ref 70–99)
Potassium: 3.7 mmol/L (ref 3.5–5.1)
Sodium: 139 mmol/L (ref 135–145)
TOTAL PROTEIN: 7.4 g/dL (ref 6.5–8.1)

## 2018-06-21 LAB — ABO/RH: ABO/RH(D): A POS

## 2018-06-21 NOTE — Progress Notes (Signed)
Routed cmet results to dr Excell Seltzer epic inbasket and left message with wendy at [facility] tri[facility] to inform dr Excell Seltzer of cmet results

## 2018-06-22 NOTE — H&P (Signed)
History of Present Illness Marland Kitchen T. Kshawn Canal MD; 06/22/2018 12:02 PM) The patient is a 57 year old female who presents with obesity. Patient returns for her preoperative visit prior to planned laparoscopic sleeve gastrectomy. I had originally seen her in 2016. Patient was referred by Dr Ree Kida for consideration for surgical treatment for morbid obesity. The patient gives a history of progressive obesity since the birth of her children despite multiple attempts at medical management. She actually has been able lose quite a bit of weight at times. Her maximum weight is 317 and with [place]'s diet she is able to get down to about 215 but has experienced some weight regain to her current weight of 266. Obesity has been affecting the patient in a number of ways including joint pain and inability to participate in activities with her family. She is very concerned about her long-term health one forward at her weight. There is significant heart disease in her family.. Significant co-morbid illnesses have developed including hypertension and DJD.Marland Kitchen The patient attended our information seminar, researched surgical options thoroughly, and after initial discussion workup 3 years ago elected to proceed with sleeve gastrectomy. She completed her full workup including psyche nutrition evaluations, negative GI series and essentially unremarkable blood work except for some minor elevated LFTs and slightly low iron levels. We were ready to schedule her for sleeve gastrectomy but due to difficulties with her family and financial problems related to this she was unable to get her surgery scheduled. She now has however completed the preoperative process and is scheduled for surgery next week. We reviewed her upper GI series which was normal. She had some minimally low iron levels and we discussed she will need to be on an additional iron supplement postoperatively. Also low vitamin D level and she will contact her  primary regarding replacement therapy. Otherwise no concerns on her preoperative workup. She has been feeling generally okay with no intercurrent infection or illness.   Problem List/Past Medical Marland Kitchen T. Annamay Laymon, MD; 06/22/2018 12:02 PM) OBESITY, MORBID, BMI 40.0-49.9   Past Surgical History Marland Kitchen T. Jerad Dunlap, MD; 06/22/2018 12:02 PM) Colon Polyp Removal - Colonoscopy  Tonsillectomy   Diagnostic Studies History Marland Kitchen T. Kalimah Capurro, MD; 06/22/2018 12:02 PM) Colonoscopy  1-5 years ago Mammogram  within last year Pap Smear  1-5 years ago  Allergies Emeline Gins, [facility]; 06/22/2018 11:28 AM) Diflucan *Antifungals**  Allergies Reconciled   Medication History Emeline Gins, CMA; 06/22/2018 11:28 AM) Losartan Potassium-HCTZ (100-12.5MG  Tablet, Oral) Active. BuPROPion HCl ER (SR) (150MG  Tablet ER 12HR, Oral) Active. FLUoxetine HCl (20MG  Tablet, Oral) Active. ROPINIRole HCl (0.25MG  Tablet, Oral) Active. TraMADol HCl (50MG  Tablet, Oral) Active. Flonase Allergy Relief (50MCG/ACT Suspension, Nasal as needed) Active. Aspirin EC (81MG  Tablet DR, Oral) Active. Medications Reconciled  Social History Marland Kitchen T. Maritza Hosterman, MD; 06/22/2018 12:02 PM) Alcohol use  Occasional alcohol use. Caffeine use  Coffee. Illicit drug use  Prefer to discuss with provider. Tobacco use  Former smoker.  Family History Marland Kitchen T. Eryca Bolte, MD; 06/22/2018 12:02 PM) Alcohol Abuse  Father. Arthritis  Father, Mother. Breast Cancer  Mother. Cancer  Father, Mother. Diabetes Mellitus  Mother. Heart Disease  Mother. Heart disease in female family member before age 33  Heart disease in female family member before age 60  Hypertension  Mother. Migraine Headache  Son.  Pregnancy / Birth History Marland Kitchen T. Rustyn Conery, MD; 06/22/2018 12:02 PM) Age at menarche  86 years. Age of menopause  51-55 Contraceptive History  Intrauterine device, Oral contraceptives. Gravida   4  Maternal age  70-20 Para  4  Other Problems Marland Kitchen T. Azaryah Heathcock, MD; 06/22/2018 12:02 PM) Anxiety Disorder  Arthritis  Back Pain  Depression  High blood pressure  Migraine Headache   Vitals Emeline Gins CMA; 06/22/2018 11:27 AM) 06/22/2018 11:26 AM Weight: 261.5 lb Height: 65in Body Surface Area: 2.22 m Body Mass Index: 43.52 kg/m  Temp.: 97.61F  Pulse: 96 (Regular)  BP: 128/78 (Sitting, Left Arm, Standard)       Physical Exam Marland Kitchen T. Alvis Pulcini MD; 06/22/2018 12:03 PM) The physical exam findings are as follows: Note:General: Alert, morbidly obese Caucasian female, in no distress Skin: Warm and dry without rash or infection. HEENT: No palpable masses or thyromegaly. Sclera nonicteric. Lymph nodes: No cervical, supraclavicular, nodes palpable. Lungs: Breath sounds clear and equal. No wheezing or increased work of breathing. Cardiovascular: Regular rate and rhythm without murmer. No JVD or edema. Abdomen: Nondistended. Soft and nontender. No masses palpable. No organomegaly. No palpable hernias. Extremities: No edema or joint swelling or deformity. No chronic venous stasis changes. Neurologic: Alert and fully oriented. Gait normal. No focal weakness. Psychiatric: Normal mood and affect. Thought content appropriate with normal judgement and insight    Assessment & Plan Marland Kitchen T. Elisandra Deshmukh MD; 06/22/2018 12:05 PM) OBESITY, MORBID, BMI 40.0-49.9 Impression: Patient with progressive morbid obesity unresponsive to multiple efforts at medical management who presents with a BMI of 44.4 and comorbidities of degenerative joint disease and hypertension. I believe there would be very significant medical benefit from surgical weight loss. After our discussion of surgical options currently available the patient has decided to proceed with laparoscopic sleeve gastrectomy due to the effect of this and reliability of the surgery and somewhat less  complexity and nutritional problems than gastric bypass. We have discussed the nature of the problem and the risks of remaining morbidly obese. We discussed laparoscopic sleeve gastrectomy in detail including the nature of the procedure, expected hospitalization and recovery, and major risks of anesthetic complications, bleeding, blood clots and pulmonary emboli, leakage and infection and long-term risks of stricture, , bowel obstruction, reflux, nutritional deficiencies, and failure to lose weight or weight regain. We discussed these problems could lead to death. We discussed that the procedure is not reversible. We discussed possible need for conversion to gastric bypass or other procedure. Successfully completed her preoperative workup and ready to proceed with surgery. She is given prescriptions for pain and nausea medication today.

## 2018-06-24 MED ORDER — BUPIVACAINE LIPOSOME 1.3 % IJ SUSP
20.0000 mL | INTRAMUSCULAR | Status: DC
  Filled 2018-06-24: qty 20

## 2018-06-25 ENCOUNTER — Inpatient Hospital Stay (HOSPITAL_COMMUNITY)
Admission: RE | Admit: 2018-06-25 | Discharge: 2018-06-26 | DRG: 621 | Disposition: A | Discharge status: Home / Self Care | Payer: 59 | Source: Ambulatory Visit | Attending: General Surgery | Admitting: General Surgery

## 2018-06-25 ENCOUNTER — Encounter (HOSPITAL_COMMUNITY)
Admission: RE | Disposition: A | Discharge status: Home / Self Care | Payer: Self-pay | Source: Ambulatory Visit | Attending: General Surgery

## 2018-06-25 ENCOUNTER — Encounter (HOSPITAL_COMMUNITY): Payer: Self-pay | Admitting: Emergency Medicine

## 2018-06-25 ENCOUNTER — Other Ambulatory Visit: Payer: Self-pay

## 2018-06-25 ENCOUNTER — Inpatient Hospital Stay (HOSPITAL_COMMUNITY): Payer: 59 | Admitting: Anesthesiology

## 2018-06-25 DIAGNOSIS — Z87891 Personal history of nicotine dependence: Secondary | ICD-10-CM | POA: Diagnosis not present

## 2018-06-25 DIAGNOSIS — Z6841 Body Mass Index (BMI) 40.0 and over, adult: Secondary | ICD-10-CM | POA: Diagnosis not present

## 2018-06-25 DIAGNOSIS — I10 Essential (primary) hypertension: Secondary | ICD-10-CM | POA: Diagnosis present

## 2018-06-25 DIAGNOSIS — Z903 Acquired absence of stomach [part of]: Secondary | ICD-10-CM

## 2018-06-25 DIAGNOSIS — Z7982 Long term (current) use of aspirin: Secondary | ICD-10-CM

## 2018-06-25 DIAGNOSIS — M549 Dorsalgia, unspecified: Secondary | ICD-10-CM | POA: Diagnosis not present

## 2018-06-25 DIAGNOSIS — Z79899 Other long term (current) drug therapy: Secondary | ICD-10-CM | POA: Diagnosis not present

## 2018-06-25 DIAGNOSIS — F329 Major depressive disorder, single episode, unspecified: Secondary | ICD-10-CM | POA: Diagnosis present

## 2018-06-25 DIAGNOSIS — F418 Other specified anxiety disorders: Secondary | ICD-10-CM | POA: Diagnosis not present

## 2018-06-25 HISTORY — PX: LAPAROSCOPIC GASTRIC SLEEVE RESECTION: SHX5895

## 2018-06-25 HISTORY — DX: Acquired absence of stomach (part of): Z90.3

## 2018-06-25 LAB — HEMOGLOBIN AND HEMATOCRIT, BLOOD
HEMATOCRIT: 39.1 % (ref 36.0–46.0)
HEMOGLOBIN: 11.9 g/dL — AB (ref 12.0–15.0)

## 2018-06-25 LAB — TYPE AND SCREEN
ABO/RH(D): A POS
Antibody Screen: NEGATIVE

## 2018-06-25 SURGERY — GASTRECTOMY, SLEEVE, LAPAROSCOPIC
Anesthesia: General

## 2018-06-25 MED ORDER — SODIUM CHLORIDE (PF) 0.9 % IJ SOLN
INTRAMUSCULAR | Status: AC
  Filled 2018-06-25: qty 50

## 2018-06-25 MED ORDER — SUGAMMADEX SODIUM 200 MG/2ML IV SOLN
INTRAVENOUS | Status: DC | PRN
  Administered 2018-06-25: 300 mg via INTRAVENOUS

## 2018-06-25 MED ORDER — SODIUM CHLORIDE (PF) 0.9 % IJ SOLN
INTRAMUSCULAR | Status: DC | PRN
  Administered 2018-06-25: 50 mL

## 2018-06-25 MED ORDER — LIDOCAINE 2% (20 MG/ML) 5 ML SYRINGE
INTRAMUSCULAR | Status: DC | PRN
  Administered 2018-06-25: 1.5 mg/kg/h via INTRAVENOUS

## 2018-06-25 MED ORDER — FLUOXETINE HCL 20 MG PO CAPS
60.0000 mg | ORAL_CAPSULE | Freq: Every day | ORAL | Status: DC
  Administered 2018-06-26: 60 mg via ORAL
  Filled 2018-06-25: qty 3

## 2018-06-25 MED ORDER — PANTOPRAZOLE SODIUM 40 MG IV SOLR
40.0000 mg | Freq: Every day | INTRAVENOUS | Status: DC
  Administered 2018-06-25: 40 mg via INTRAVENOUS
  Filled 2018-06-25: qty 40

## 2018-06-25 MED ORDER — ENOXAPARIN SODIUM 30 MG/0.3ML ~~LOC~~ SOLN
30.0000 mg | Freq: Two times a day (BID) | SUBCUTANEOUS | Status: DC
  Administered 2018-06-25 – 2018-06-26 (×2): 30 mg via SUBCUTANEOUS
  Filled 2018-06-25 (×2): qty 0.3

## 2018-06-25 MED ORDER — HYDROCHLOROTHIAZIDE 25 MG PO TABS
25.0000 mg | ORAL_TABLET | Freq: Every day | ORAL | Status: DC
  Administered 2018-06-26: 25 mg via ORAL
  Filled 2018-06-25: qty 1

## 2018-06-25 MED ORDER — ONDANSETRON HCL 4 MG/2ML IJ SOLN
INTRAMUSCULAR | Status: AC
  Filled 2018-06-25: qty 2

## 2018-06-25 MED ORDER — FLUTICASONE PROPIONATE 50 MCG/ACT NA SUSP
2.0000 | Freq: Every day | NASAL | Status: DC | PRN
  Filled 2018-06-25: qty 16

## 2018-06-25 MED ORDER — ONDANSETRON HCL 4 MG/2ML IJ SOLN: 4.0000 mg | Freq: Once | INTRAMUSCULAR | Status: DC | PRN

## 2018-06-25 MED ORDER — LIDOCAINE 2% (20 MG/ML) 5 ML SYRINGE
INTRAMUSCULAR | Status: DC | PRN
  Administered 2018-06-25: 100 mg via INTRAVENOUS

## 2018-06-25 MED ORDER — ROCURONIUM BROMIDE 100 MG/10ML IV SOLN
INTRAVENOUS | Status: AC
  Filled 2018-06-25: qty 1

## 2018-06-25 MED ORDER — HEPARIN SODIUM (PORCINE) 5000 UNIT/ML IJ SOLN
5000.0000 [IU] | INTRAMUSCULAR | Status: AC
  Administered 2018-06-25: 5000 [IU] via SUBCUTANEOUS
  Filled 2018-06-25: qty 1

## 2018-06-25 MED ORDER — BUPIVACAINE-EPINEPHRINE (PF) 0.25% -1:200000 IJ SOLN
INTRAMUSCULAR | Status: AC
  Filled 2018-06-25: qty 30

## 2018-06-25 MED ORDER — ACETAMINOPHEN 500 MG PO TABS
1000.0000 mg | ORAL_TABLET | ORAL | Status: AC
  Administered 2018-06-25: 1000 mg via ORAL
  Filled 2018-06-25: qty 2

## 2018-06-25 MED ORDER — BUPIVACAINE LIPOSOME 1.3 % IJ SUSP
INTRAMUSCULAR | Status: DC | PRN
  Administered 2018-06-25: 20 mL

## 2018-06-25 MED ORDER — MIDAZOLAM HCL 2 MG/2ML IJ SOLN
INTRAMUSCULAR | Status: DC | PRN
  Administered 2018-06-25: 2 mg via INTRAVENOUS

## 2018-06-25 MED ORDER — MORPHINE SULFATE (PF) 4 MG/ML IV SOLN
1.0000 mg | INTRAVENOUS | Status: DC | PRN
  Administered 2018-06-25: 3 mg via INTRAVENOUS
  Filled 2018-06-25: qty 1

## 2018-06-25 MED ORDER — GABAPENTIN 300 MG PO CAPS
300.0000 mg | ORAL_CAPSULE | ORAL | Status: AC
  Administered 2018-06-25: 300 mg via ORAL
  Filled 2018-06-25: qty 1

## 2018-06-25 MED ORDER — METHOCARBAMOL 500 MG IVPB - SIMPLE MED
500.0000 mg | Freq: Once | INTRAVENOUS | Status: AC
  Administered 2018-06-25: 500 mg via INTRAVENOUS

## 2018-06-25 MED ORDER — GLUCAGON HCL RDNA (DIAGNOSTIC) 1 MG IJ SOLR
INTRAMUSCULAR | Status: DC | PRN
  Administered 2018-06-25: 1 mg via INTRAVENOUS

## 2018-06-25 MED ORDER — EVICEL 5 ML EX KIT
PACK | Freq: Once | CUTANEOUS | Status: AC
  Administered 2018-06-25: 1
  Filled 2018-06-25: qty 1

## 2018-06-25 MED ORDER — PROPOFOL 10 MG/ML IV BOLUS
INTRAVENOUS | Status: AC
  Filled 2018-06-25: qty 20

## 2018-06-25 MED ORDER — PREMIER PROTEIN SHAKE
2.0000 [oz_av] | ORAL | Status: DC
  Administered 2018-06-26 (×2): 2 [oz_av] via ORAL

## 2018-06-25 MED ORDER — CHLORHEXIDINE GLUCONATE CLOTH 2 % EX PADS: 6.0000 | MEDICATED_PAD | Freq: Once | CUTANEOUS | Status: DC

## 2018-06-25 MED ORDER — BUPROPION HCL ER (SR) 150 MG PO TB12
300.0000 mg | ORAL_TABLET | ORAL | Status: DC
  Administered 2018-06-26: 300 mg via ORAL
  Filled 2018-06-25: qty 2

## 2018-06-25 MED ORDER — ONDANSETRON HCL 4 MG/2ML IJ SOLN
INTRAMUSCULAR | Status: DC | PRN
  Administered 2018-06-25: 4 mg via INTRAVENOUS

## 2018-06-25 MED ORDER — LACTATED RINGERS IV SOLN
INTRAVENOUS | Status: DC
  Administered 2018-06-25: 11:00:00 via INTRAVENOUS

## 2018-06-25 MED ORDER — BUPIVACAINE-EPINEPHRINE 0.25% -1:200000 IJ SOLN
INTRAMUSCULAR | Status: DC | PRN
  Administered 2018-06-25: 30 mL

## 2018-06-25 MED ORDER — OXYCODONE HCL 5 MG PO TABS: 5.0000 mg | ORAL_TABLET | Freq: Once | ORAL | Status: DC | PRN

## 2018-06-25 MED ORDER — FENTANYL CITRATE (PF) 100 MCG/2ML IJ SOLN: 25.0000 ug | INTRAMUSCULAR | Status: DC | PRN

## 2018-06-25 MED ORDER — PROPOFOL 10 MG/ML IV BOLUS
INTRAVENOUS | Status: DC | PRN
  Administered 2018-06-25: 150 mg via INTRAVENOUS

## 2018-06-25 MED ORDER — OXYCODONE HCL 5 MG/5ML PO SOLN: 5.0000 mg | ORAL | Status: DC | PRN

## 2018-06-25 MED ORDER — SUGAMMADEX SODIUM 500 MG/5ML IV SOLN
INTRAVENOUS | Status: AC
  Filled 2018-06-25: qty 5

## 2018-06-25 MED ORDER — DEXAMETHASONE SODIUM PHOSPHATE 4 MG/ML IJ SOLN: 4.0000 mg | INTRAMUSCULAR | Status: DC

## 2018-06-25 MED ORDER — HYDRALAZINE HCL 20 MG/ML IJ SOLN: 10.0000 mg | INTRAMUSCULAR | Status: DC | PRN

## 2018-06-25 MED ORDER — DEXAMETHASONE SODIUM PHOSPHATE 10 MG/ML IJ SOLN
INTRAMUSCULAR | Status: DC | PRN
  Administered 2018-06-25: 10 mg via INTRAVENOUS

## 2018-06-25 MED ORDER — STERILE WATER FOR IRRIGATION IR SOLN
Status: DC | PRN
  Administered 2018-06-25: 1000 mL

## 2018-06-25 MED ORDER — MORPHINE SULFATE (PF) 4 MG/ML IV SOLN
INTRAVENOUS | Status: AC
  Filled 2018-06-25: qty 1

## 2018-06-25 MED ORDER — OXYCODONE HCL 5 MG/5ML PO SOLN
5.0000 mg | Freq: Once | ORAL | Status: DC | PRN
  Filled 2018-06-25: qty 5

## 2018-06-25 MED ORDER — GLUCAGON HCL RDNA (DIAGNOSTIC) 1 MG IJ SOLR
INTRAMUSCULAR | Status: AC
  Filled 2018-06-25: qty 1

## 2018-06-25 MED ORDER — DEXAMETHASONE SODIUM PHOSPHATE 10 MG/ML IJ SOLN
INTRAMUSCULAR | Status: AC
  Filled 2018-06-25: qty 1

## 2018-06-25 MED ORDER — LACTATED RINGERS IR SOLN
Status: DC | PRN
  Administered 2018-06-25: 1000 mL

## 2018-06-25 MED ORDER — MIDAZOLAM HCL 2 MG/2ML IJ SOLN
INTRAMUSCULAR | Status: AC
  Filled 2018-06-25: qty 2

## 2018-06-25 MED ORDER — METHOCARBAMOL 500 MG IVPB - SIMPLE MED
INTRAVENOUS | Status: AC
  Filled 2018-06-25: qty 50

## 2018-06-25 MED ORDER — PANTOPRAZOLE SODIUM 40 MG PO TBEC: 40.0000 mg | DELAYED_RELEASE_TABLET | Freq: Every day | ORAL | Status: DC

## 2018-06-25 MED ORDER — ACETAMINOPHEN 160 MG/5ML PO SOLN
650.0000 mg | Freq: Four times a day (QID) | ORAL | Status: DC
  Administered 2018-06-26 (×2): 650 mg via ORAL
  Filled 2018-06-25 (×2): qty 20.3

## 2018-06-25 MED ORDER — SODIUM CHLORIDE 0.9 % IV SOLN
2.0000 g | INTRAVENOUS | Status: AC
  Administered 2018-06-25: 2 g via INTRAVENOUS
  Filled 2018-06-25: qty 2

## 2018-06-25 MED ORDER — ONDANSETRON HCL 4 MG/2ML IJ SOLN
4.0000 mg | INTRAMUSCULAR | Status: DC | PRN
  Filled 2018-06-25: qty 2

## 2018-06-25 MED ORDER — METHYLPHENIDATE HCL 10 MG PO TABS: 60.0000 mg | ORAL_TABLET | Freq: Every day | ORAL | Status: DC

## 2018-06-25 MED ORDER — FENTANYL CITRATE (PF) 250 MCG/5ML IJ SOLN
INTRAMUSCULAR | Status: DC | PRN
  Administered 2018-06-25: 100 ug via INTRAVENOUS
  Administered 2018-06-25 (×3): 50 ug via INTRAVENOUS

## 2018-06-25 MED ORDER — APREPITANT 40 MG PO CAPS
40.0000 mg | ORAL_CAPSULE | ORAL | Status: AC
  Administered 2018-06-25: 40 mg via ORAL
  Filled 2018-06-25: qty 1

## 2018-06-25 MED ORDER — LOSARTAN POTASSIUM 50 MG PO TABS
50.0000 mg | ORAL_TABLET | Freq: Every day | ORAL | Status: DC
  Administered 2018-06-26: 50 mg via ORAL
  Filled 2018-06-25: qty 1

## 2018-06-25 MED ORDER — LIDOCAINE 2% (20 MG/ML) 5 ML SYRINGE
INTRAMUSCULAR | Status: AC
  Filled 2018-06-25: qty 5

## 2018-06-25 MED ORDER — ROCURONIUM BROMIDE 100 MG/10ML IV SOLN
INTRAVENOUS | Status: DC | PRN
  Administered 2018-06-25 (×2): 10 mg via INTRAVENOUS
  Administered 2018-06-25: 60 mg via INTRAVENOUS

## 2018-06-25 MED ORDER — SCOPOLAMINE 1 MG/3DAYS TD PT72
1.0000 | MEDICATED_PATCH | TRANSDERMAL | Status: DC
  Administered 2018-06-25: 1.5 mg via TRANSDERMAL
  Filled 2018-06-25: qty 1

## 2018-06-25 MED ORDER — KETAMINE HCL 10 MG/ML IJ SOLN
INTRAMUSCULAR | Status: DC | PRN
  Administered 2018-06-25: 50 mg via INTRAVENOUS

## 2018-06-25 MED ORDER — ROPINIROLE HCL 1 MG PO TABS: 1.0000 mg | ORAL_TABLET | Freq: Every day | ORAL | Status: DC

## 2018-06-25 MED ORDER — POTASSIUM CHLORIDE IN NACL 20-0.45 MEQ/L-% IV SOLN
INTRAVENOUS | Status: DC
  Administered 2018-06-25 – 2018-06-26 (×2): via INTRAVENOUS
  Filled 2018-06-25 (×2): qty 1000

## 2018-06-25 MED ORDER — FENTANYL CITRATE (PF) 250 MCG/5ML IJ SOLN
INTRAMUSCULAR | Status: AC
  Filled 2018-06-25: qty 5

## 2018-06-25 MED FILL — ONDANSETRON ODT 4 MG TABLET: 4 | 3 days supply | Qty: 15 | Fill #0

## 2018-06-25 MED FILL — oxyCODONE HCL 5 MG/5ML SOLN: 5 | 2 days supply | Qty: 50 | Fill #0

## 2018-06-25 SURGICAL SUPPLY — 71 items
ADH SKN CLS APL DERMABOND .7 (GAUZE/BANDAGES/DRESSINGS) ×1
APL SWBSTK 6 STRL LF DISP (MISCELLANEOUS)
APPLICATOR COTTON TIP 6 STRL (MISCELLANEOUS) IMPLANT
APPLICATOR COTTON TIP 6IN STRL (MISCELLANEOUS)
APPLIER CLIP ROT 10 11.4 M/L (STAPLE)
APPLIER CLIP ROT 13.4 12 LRG (CLIP)
APR CLP LRG 13.4X12 ROT 20 MLT (CLIP)
APR CLP MED LRG 11.4X10 (STAPLE)
BLADE SURG SZ11 CARB STEEL (BLADE) ×2 IMPLANT
CABLE HIGH FREQUENCY MONO STRZ (ELECTRODE) ×2 IMPLANT
CHLORAPREP W/TINT 26ML (MISCELLANEOUS) ×4 IMPLANT
CLIP APPLIE ROT 10 11.4 M/L (STAPLE) IMPLANT
CLIP APPLIE ROT 13.4 12 LRG (CLIP) IMPLANT
COVER WAND RF STERILE (DRAPES) ×2 IMPLANT
DERMABOND ADVANCED (GAUZE/BANDAGES/DRESSINGS) ×1
DERMABOND ADVANCED .7 DNX12 (GAUZE/BANDAGES/DRESSINGS) ×1 IMPLANT
DEVICE SUT QUICK LOAD TK 5 (STAPLE) IMPLANT
DEVICE SUT TI-KNOT TK 5X26 (MISCELLANEOUS) IMPLANT
DEVICE SUTURE ENDOST 10MM (ENDOMECHANICALS) IMPLANT
DRAPE UTILITY XL STRL (DRAPES) ×4 IMPLANT
ELECT REM PT RETURN 15FT ADLT (MISCELLANEOUS) ×2 IMPLANT
GAUZE SPONGE 4X4 12PLY STRL (GAUZE/BANDAGES/DRESSINGS) IMPLANT
GLOVE BIO SURGEON STRL SZ 6.5 (GLOVE) ×2 IMPLANT
GLOVE BIOGEL M 8.0 STRL (GLOVE) ×2 IMPLANT
GLOVE BIOGEL PI IND STRL 6.5 (GLOVE) ×1 IMPLANT
GLOVE BIOGEL PI IND STRL 7.0 (GLOVE) ×2 IMPLANT
GLOVE BIOGEL PI IND STRL 7.5 (GLOVE) ×1 IMPLANT
GLOVE BIOGEL PI INDICATOR 6.5 (GLOVE) ×1
GLOVE BIOGEL PI INDICATOR 7.0 (GLOVE) ×2
GLOVE BIOGEL PI INDICATOR 7.5 (GLOVE) ×1
GLOVE ECLIPSE 7.5 STRL STRAW (GLOVE) ×2 IMPLANT
GOWN STRL REUS W/ TWL LRG LVL3 (GOWN DISPOSABLE) ×1 IMPLANT
GOWN STRL REUS W/TWL LRG LVL3 (GOWN DISPOSABLE) ×2
GOWN STRL REUS W/TWL XL LVL3 (GOWN DISPOSABLE) ×6 IMPLANT
GRASPER SUT TROCAR 14GX15 (MISCELLANEOUS) IMPLANT
HOVERMATT SINGLE USE (MISCELLANEOUS) ×2 IMPLANT
KIT BASIN OR (CUSTOM PROCEDURE TRAY) ×2 IMPLANT
KIT PRC NS LF DISP ENDO (KITS) ×1 IMPLANT
KIT PROCEDURE OLYMPUS (KITS) ×2
MARKER SKIN DUAL TIP RULER LAB (MISCELLANEOUS) ×2 IMPLANT
NEEDLE SPNL 22GX3.5 QUINCKE BK (NEEDLE) ×2 IMPLANT
PACK UNIVERSAL I (CUSTOM PROCEDURE TRAY) ×2 IMPLANT
RELOAD STAPLER BLUE 60MM (STAPLE) ×2 IMPLANT
RELOAD STAPLER GOLD 60MM (STAPLE) ×2 IMPLANT
RELOAD STAPLER GREEN 60MM (STAPLE) ×1 IMPLANT
SCISSORS LAP 5X45 EPIX DISP (ENDOMECHANICALS) ×2 IMPLANT
SET IRRIG TUBING LAPAROSCOPIC (IRRIGATION / IRRIGATOR) ×2 IMPLANT
SHEARS HARMONIC ACE PLUS 45CM (MISCELLANEOUS) ×2 IMPLANT
SLEEVE ADV FIXATION 5X100MM (TROCAR) ×2 IMPLANT
SLEEVE GASTRECTOMY 36FR VISIGI (MISCELLANEOUS) ×2 IMPLANT
SOLUTION ANTI FOG 6CC (MISCELLANEOUS) ×2 IMPLANT
SPONGE LAP 18X18 RF (DISPOSABLE) ×2 IMPLANT
STAPLER ECHELON LONG 60 440 (INSTRUMENTS) ×2 IMPLANT
STAPLER RELOAD BLUE 60MM (STAPLE) ×4
STAPLER RELOAD GOLD 60MM (STAPLE) ×4
STAPLER RELOAD GREEN 60MM (STAPLE) ×2
SUT MNCRL AB 4-0 PS2 18 (SUTURE) ×2 IMPLANT
SUT SURGIDAC NAB ES-9 0 48 120 (SUTURE) IMPLANT
SUT VIC AB 0 BRD 54 (SUTURE) IMPLANT
SYR 10ML ECCENTRIC (SYRINGE) ×2 IMPLANT
SYR 20CC LL (SYRINGE) ×4 IMPLANT
SYR 50ML LL SCALE MARK (SYRINGE) ×2 IMPLANT
SYSTEM WECK SHIELD CLOSURE (TROCAR) IMPLANT
TIP RIGID 35CM EVICEL (HEMOSTASIS) ×2 IMPLANT
TOWEL OR 17X26 10 PK STRL BLUE (TOWEL DISPOSABLE) ×2 IMPLANT
TOWEL OR NON WOVEN STRL DISP B (DISPOSABLE) ×2 IMPLANT
TROCAR ADV FIXATION 5X100MM (TROCAR) ×2 IMPLANT
TROCAR BLADELESS 15MM (ENDOMECHANICALS) ×4 IMPLANT
TROCAR BLADELESS OPT 5 100 (ENDOMECHANICALS) ×2 IMPLANT
TUBING CONNECTING 10 (TUBING) ×4 IMPLANT
TUBING INSUF HEATED (TUBING) ×2 IMPLANT

## 2018-06-25 NOTE — Progress Notes (Signed)
PHARMACY CONSULT FOR:  Risk Assessment for Post-Discharge VTE Following Bariatric Surgery  Post-Discharge VTE Risk Assessment: This patient's probability of 30-day post-discharge VTE is increased due to the factors marked:   Female    Age >/=60 years    BMI >/=50 kg/m2    CHF    Dyspnea at Rest    Paraplegia  X Non-gastric-band surgery    Operation Time >/=3 hr    Return to OR     Length of Stay >/= 3 d   Predicted probability of 30-day post-discharge VTE: 0.16%   Recommendation for Discharge: No pharmacologic prophylaxis post-discharge  Sheryl Cooper is a 57 y.o. female who underwent  Laparoscopic gastric sleeve resection on 06/25/2018    Case start: 06/25/2018 @ 12:34 Case end: 06/25/2018 @ 14:44   Allergies  Allergen Reactions  . Diflucan [Fluconazole] Itching  . Sulfa Antibiotics Hives    Patient Measurements: Height: 5\' 5"  (165.1 cm) Weight: 259 lb 2 oz (117.5 kg) IBW/kg (Calculated) : 57 Body mass index is 43.12 kg/m.  No results for input(s): WBC, HGB, HCT, PLT, APTT, CREATININE, LABCREA, CREATININE, CREAT24HRUR, MG, PHOS, ALBUMIN, PROT, ALBUMIN, AST, ALT, ALKPHOS, BILITOT, BILIDIR, IBILI in the last 72 hours. Estimated Creatinine Clearance: 77.6 mL/min (by C-G formula based on SCr of 1 mg/dL).    Past Medical History:  Diagnosis Date  . Abdominal distension FEW YRS AGO  . Abdominal pain, left lower quadrant 02/13/2015  . Anxiety   . Arthritis   . Chest pain FEW YRS AGO   WAS A PANIC ATTACK  . Conjunctivitis   . Episode of syncope 09/27/2010   Hx of syncope, with witnessed seizure-like activity lasting 20 minutes, and possible postictal state on 09/22/10. Evaluated in ED - Normal exam, CT head, CE, CMP, UDS, ETOH, d-dimer, UA. CBC with Hgb 10.6 (stable). EKG with prolonged QT. Marland Kitchen Because episode was possibly a seizure, guidelines state that patient should not drive for 3 months or until seen by neurology   . GERD (gastroesophageal reflux disease)   .  Hypertension   . Insomnia   . Obesity, unspecified   . PANIC ATTACK   . Restless leg syndrome      Medications Prior to Admission  Medication Sig Dispense Refill Last Dose  . ASPIR-LOW 81 MG EC tablet TAKE 1 TABLET BY MOUTH DAILY. 90 tablet 3 06/22/2018  . buPROPion (WELLBUTRIN SR) 150 MG 12 hr tablet Take 300 mg by mouth every morning.  30 tablet 5 06/25/2018 at 0800  . FLUoxetine (PROZAC) 20 MG capsule TAKE 3 CAPSULES BY MOUTH DAILY. 90 capsule 2 06/25/2018 at 0800  . hydrochlorothiazide (HYDRODIURIL) 25 MG tablet TAKE 1 TABLET (25 MG TOTAL) BY MOUTH DAILY. 90 tablet 3 06/24/2018 at Unknown time  . losartan (COZAAR) 50 MG tablet Take 1 tablet (50 mg total) by mouth daily. 90 tablet 3 06/25/2018 at 0800  . meloxicam (MOBIC) 15 MG tablet TAKE 1 TABLET BY MOUTH DAILY WITH FOOD 90 tablet PRN 06/22/2018  . methylphenidate (RITALIN) 20 MG tablet Take 3 tabs by mouth in AM (Patient taking differently: Take 60 mg by mouth every morning. Take 3 tabs by mouth in AM) 90 tablet 0 06/24/2018 at Unknown time  . pantoprazole (PROTONIX) 40 MG tablet TAKE 1 TABLET BY MOUTH EVERY MORNING 30 MINUTES PRIOR TO BREAKFAST. 90 tablet 3 06/24/2018 at Unknown time  . rOPINIRole (REQUIP) 1 MG tablet TAKE 1 TABLET BY MOUTH AT BEDTIME. 90 tablet 1 06/24/2018 at Unknown time  .  traMADol (ULTRAM) 50 MG tablet Take 1 tablet (50 mg total) by mouth every 8 (eight) hours as needed. for pain 90 tablet 3 06/24/2018 at Unknown time  . fluticasone (FLONASE) 50 MCG/ACT nasal spray Place 2 sprays into both nostrils daily. (Patient taking differently: Place 2 sprays into both nostrils daily as needed for allergies. ) 16 g 6 More than a month at Unknown time  . Insulin Pen Needle 32G X 4 MM MISC Use to inject Saxenda daily. (Patient not taking: Reported on 08/27/2017) 100 each 1 Not Taking  . Liraglutide -Weight Management (SAXENDA) 18 MG/3ML SOPN Inject 1.5-3 mg into the skin daily. Inject 1.5 mg into skin daily 5 pen 3 More than a  month at Unknown time       Everette Rank, PharmD 06/25/2018,6:09 PM

## 2018-06-25 NOTE — Progress Notes (Signed)
Patient started her 2oz water cups.

## 2018-06-25 NOTE — Interval H&P Note (Signed)
History and Physical Interval Note:  06/25/2018 12:08 PM  Sheryl Cooper  has presented today for surgery, with the diagnosis of Morbid Obesity, HTN, Back Pain  The various methods of treatment have been discussed with the patient and family. After consideration of risks, benefits and other options for treatment, the patient has consented to  Procedure(s): LAPAROSCOPIC GASTRIC SLEEVE RESECTION, UPPER ENDO, ERAS PATHWAY (N/A) as a surgical intervention .  The patient's history has been reviewed, patient examined, no change in status, stable for surgery.  I have reviewed the patient's chart and labs.  Questions were answered to the patient's satisfaction.     [name] [name]

## 2018-06-25 NOTE — Anesthesia Procedure Notes (Signed)
Procedure Name: Intubation Date/Time: 06/25/2018 12:34 PM Performed by: Sharlette Dense, CRNA Patient Re-evaluated:Patient Re-evaluated prior to induction Oxygen Delivery Method: Circle system utilized Preoxygenation: Pre-oxygenation with 100% oxygen Induction Type: IV induction Ventilation: Mask ventilation without difficulty and Oral airway inserted - appropriate to patient size Laryngoscope Size: Miller and 3 Grade View: Grade I Tube type: Oral Tube size: 7.5 mm Number of attempts: 1 Airway Equipment and Method: Stylet Placement Confirmation: ETT inserted through vocal cords under direct vision,  positive ETCO2 and breath sounds checked- equal and bilateral Secured at: 21 cm Tube secured with: Tape Dental Injury: Teeth and Oropharynx as per pre-operative assessment

## 2018-06-25 NOTE — Anesthesia Preprocedure Evaluation (Addendum)
Anesthesia Evaluation  Patient identified by MRN, date of birth, ID band Patient awake    Reviewed: Allergy & Precautions, NPO status , Patient's Chart, lab work & pertinent test results  History of Anesthesia Complications Negative for: history of anesthetic complications  Airway Mallampati: III  TM Distance: >3 FB Neck ROM: Full    Dental  (+) Poor Dentition, Partial Upper, Dental Advisory Given Poor dentition, multiple missing upper teeth, multiple broken teeth:   Pulmonary neg pulmonary ROS, former smoker,    Pulmonary exam normal        Cardiovascular hypertension, Pt. on medications Normal cardiovascular exam     Neuro/Psych PSYCHIATRIC DISORDERS Anxiety Depression negative neurological ROS     GI/Hepatic Neg liver ROS, GERD  ,  Endo/Other  Morbid obesity  Renal/GU negative Renal ROS  negative genitourinary   Musculoskeletal negative musculoskeletal ROS (+)   Abdominal (+) + obese,   Peds  Hematology negative hematology ROS (+)   Anesthesia Other Findings   Reproductive/Obstetrics                            Anesthesia Physical Anesthesia Plan  ASA: III  Anesthesia Plan: General   Post-op Pain Management:    Induction: Intravenous  PONV Risk Score and Plan: 4 or greater and Ondansetron, Dexamethasone, Midazolam and Treatment may vary due to age or medical condition  Airway Management Planned: Oral ETT  Additional Equipment: None  Intra-op Plan:   Post-operative Plan: Extubation in OR  Informed Consent: I have reviewed the patients History and Physical, chart, labs and discussed the procedure including the risks, benefits and alternatives for the proposed anesthesia with the patient or authorized representative who has indicated his/her understanding and acceptance.     Plan Discussed with:   Anesthesia Plan Comments:        Anesthesia Quick Evaluation

## 2018-06-25 NOTE — Op Note (Signed)
MODENE ANDY [id number] 1961/02/25 06/25/2018  Preoperative diagnosis: sleeve gastrectomy in progress  Postoperative diagnosis: Same   Procedure: Upper endoscopy   Surgeon: Catalina Antigua B. Hassell Done  M.D., FACS   Anesthesia: Gen.   Indications for procedure: This patient was undergoing a sleeve gastrectomy and endscopy was done to look at geometry and check for leaks and bleeding.    Description of procedure: The endoscopy was placed in the mouth and into the oropharynx and under endoscopic vision it was advanced to the esophagogastric junction.  The pouch was insufflated and the symmetry was good.  Scope was passed in to the antrum and the pylorus was examined.  .   No bleeding or leaks were detected.  The scope was withdrawn without difficulty.     Matt B. Hassell Done, MD, FACS General, Bariatric, & Minimally Invasive Surgery [facility], [place]

## 2018-06-25 NOTE — Anesthesia Postprocedure Evaluation (Signed)
Anesthesia Post Note  Patient: Sheryl Cooper  Procedure(s) Performed: LAPAROSCOPIC GASTRIC SLEEVE RESECTION, UPPER ENDO, ERAS PATHWAY (N/A )     Patient location during evaluation: PACU Anesthesia Type: General Level of consciousness: awake and alert Pain management: pain level controlled Vital Signs Assessment: post-procedure vital signs reviewed and stable Respiratory status: spontaneous breathing, nonlabored ventilation and respiratory function stable Cardiovascular status: blood pressure returned to baseline and stable Postop Assessment: no apparent nausea or vomiting Anesthetic complications: no    Last Vitals:  Vitals:   06/25/18 1700 06/25/18 1800  BP: (!) 113/51 120/60  Pulse: 71 66  Resp: 16 17  Temp: 36.7 C 37 C  SpO2: 96% 97%    Last Pain:  Vitals:   06/25/18 1800  TempSrc: Oral  PainSc: 2                  Lidia Collum

## 2018-06-25 NOTE — Discharge Instructions (Signed)
° ° ° °GASTRIC BYPASS/SLEEVE ° Home Care Instructions ° ° These instructions are to help you care for yourself when you go home. ° °Call: If you have any problems. °• Call [phone] and ask for the surgeon on call °• If you need immediate help, come to the ER at [place].  °• Tell the ER staff that you are a new post-op gastric bypass or gastric sleeve patient °  °Signs and symptoms to report: • Severe vomiting or nausea °o If you cannot keep down clear liquids for longer than 1 day, call your surgeon  °• Abdominal pain that does not get better after taking your pain medication °• Fever over 100.4° F with chills °• Heart beating over 100 beats a minute °• Shortness of breath at rest °• Chest pain °•  Redness, swelling, drainage, or foul odor at incision (surgical) sites °•  If your incisions open or pull apart °• Swelling or pain in calf (lower leg) °• Diarrhea (Loose bowel movements that happen often), frequent watery, uncontrolled bowel movements °• Constipation, (no bowel movements for 3 days) if this happens: Pick one °o Milk of Magnesia, 2 tablespoons by mouth, 3 times a day for 2 days if needed °o Stop taking Milk of Magnesia once you have a bowel movement °o Call your doctor if constipation continues °Or °o Miralax  (instead of Milk of Magnesia) following the label instructions °o Stop taking Miralax once you have a bowel movement °o Call your doctor if constipation continues °• Anything you think is not normal °  °Normal side effects after surgery: • Unable to sleep at night or unable to focus °• Irritability or moody °• Being tearful (crying) or depressed °These are common complaints, possibly related to your anesthesia medications that put you to sleep, stress of surgery, and change in lifestyle.  This usually goes away a few weeks after surgery.  If these feelings continue, call your primary care doctor. °  °Wound Care: You may have surgical glue, steri-strips, or staples over your incisions after  surgery °• Surgical glue:  Looks like a clear film over your incisions and will wear off a little at a time °• Steri-strips: Strips of tape over your incisions. You may notice a yellowish color on the skin under the steri-strips. This is used to make the   steri-strips stick better. Do not pull the steri-strips off - let them fall off °• Staples: Staples may be removed before you leave the hospital °o If you go home with staples, call [facility], [phone] at for an appointment with your surgeon’s nurse to have staples removed 10 days after surgery. °• Showering: You may shower two (2) days after your surgery unless your surgeon tells you differently °o Wash gently around incisions with warm soapy water, rinse well, and gently pat dry  °o No tub baths until staples are removed, steri-strips fall off or glue is gone.  °  °Medications: • Medications should be liquid or crushed if larger than the size of a dime °• Extended release pills (medication that release a little bit at a time through the day) should NOT be crushed or cut. (examples include XL, ER, DR, SR) °• Depending on the size and number of medications you take, you may need to space (take a few throughout the day)/change the time you take your medications so that you do not over-fill your pouch (smaller stomach) °• Make sure you follow-up with your primary care doctor to   make medication changes needed during rapid weight loss and life-style changes °• If you have diabetes, follow up with the doctor that orders your diabetes medication(s) within one week after surgery and check your blood sugar regularly. °• Do not drive while taking prescription pain medication  °• It is ok to take Tylenol by the bottle instructions with your pain medicine or instead of your pain medicine as needed.  DO NOT TAKE NSAIDS (EXAMPLES OF NSAIDS:  IBUPROFREN/ NAPROXEN)  °Diet:                    First 2 Weeks ° You will see the dietician t about two (2) weeks  after your surgery. The dietician will increase the types of foods you can eat if you are handling liquids well: °• If you have severe vomiting or nausea and cannot keep down clear liquids lasting longer than 1 day, call your surgeon @ ([phone]) °Protein Shake °• Drink at least 2 ounces of shake 5-6 times per day °• Each serving of protein shakes (usually 8 - 12 ounces) should have: °o 15 grams of protein  °o And no more than 5 grams of carbohydrate  °• Goal for protein each day: °o Men = 80 grams per day °o Women = 60 grams per day °• Protein powder may be added to fluids such as non-fat milk or Lactaid milk or unsweetened Soy/Almond milk (limit to 35 grams added protein powder per serving) ° °Hydration °• Slowly increase the amount of water and other clear liquids as tolerated (See Acceptable Fluids) °• Slowly increase the amount of protein shake as tolerated  °•  Sip fluids slowly and throughout the day.  Do not use straws. °• May use sugar substitutes in small amounts (no more than 6 - 8 packets per day; i.e. Splenda) ° °Fluid Goal °• The first goal is to drink at least 8 ounces of protein shake/drink per day (or as directed by the nutritionist); some examples of protein shakes are Syntrax Nectar, Adkins Advantage, EAS Edge HP, and Unjury. See handout from pre-op Bariatric Education Class: °o Slowly increase the amount of protein shake you drink as tolerated °o You may find it easier to slowly sip shakes throughout the day °o It is important to get your proteins in first °• Your fluid goal is to drink 64 - 100 ounces of fluid daily °o It may take a few weeks to build up to this °• 32 oz (or more) should be clear liquids  °And  °• 32 oz (or more) should be full liquids (see below for examples) °• Liquids should not contain sugar, caffeine, or carbonation ° °Clear Liquids: °• Water or Sugar-free flavored water (i.e. Fruit H2O, Propel) °• Decaffeinated coffee or tea (sugar-free) °• Crystal Lite, Wyler’s Lite,  Minute Maid Lite °• Sugar-free Jell-O °• Bouillon or broth °• Sugar-free Popsicle:   *Less than 20 calories each; Limit 1 per day ° °Full Liquids: °Protein Shakes/Drinks + 2 choices per day of other full liquids °• Full liquids must be: °o No More Than 15 grams of Carbs per serving  °o No More Than 3 grams of Fat per serving °• Strained low-fat cream soup (except Cream of Potato or Tomato) °• Non-Fat milk °• Fat-free Lactaid Milk °• Unsweetened Soy Or Unsweetened Almond Milk °• Low Sugar yogurt (Dannon Lite & Fit, Greek yogurt; Oikos Triple Zero; Chobani Simply 100; Yoplait 100 calorie Greek - No Fruit on the Bottom) ° °  °Vitamins   and Minerals • Start 1 day after surgery unless otherwise directed by your surgeon °• 2 Chewable Bariatric Specific Multivitamin / Multimineral Supplement with iron (Example: Bariatric Advantage Multi EA) °• Chewable Calcium with Vitamin D-3 °(Example: 3 Chewable Calcium Plus 600 with Vitamin D-3) °o Take 500 mg three (3) times a day for a total of 1500 mg each day °o Do not take all 3 doses of calcium at one time as it may cause constipation, and you can only absorb 500 mg  at a time  °o Do not mix multivitamins containing iron with calcium supplements; take 2 hours apart °• Menstruating women and those with a history of anemia (a blood disease that causes weakness) may need extra iron °o Talk with your doctor to see if you need more iron °• Do not stop taking or change any vitamins or minerals until you talk to your dietitian or surgeon °• Your Dietitian and/or surgeon must approve all vitamin and mineral supplements °  °Activity and Exercise: Limit your physical activity as instructed by your doctor.  It is important to continue walking at home.  During this time, use these guidelines: °• Do not lift anything greater than ten (10) pounds for at least two (2) weeks °• Do not go back to work or drive until your surgeon says you can °• You may have sex when you feel comfortable  °o It is  VERY important for female patients to use a reliable birth control method; fertility often increases after surgery  °o All hormonal birth control will be ineffective for 30 days after surgery due to medications given during surgery a barrier method must be used. °o Do not get pregnant for at least 18 months °• Start exercising as soon as your doctor tells you that you can °o Make sure your doctor approves any physical activity °• Start with a simple walking program °• Walk 5-15 minutes each day, 7 days per week.  °• Slowly increase until you are walking 30-45 minutes per day °Consider joining our BELT program. [phone] or email [email or web address] °  °Special Instructions Things to remember: °• Use your CPAP when sleeping if this applies to you ° °• [facility] has two free Bariatric Surgery Support Groups that meet monthly °o The 3rd Thursday of each month, 6 pm, [employer] [facility]  °o The 2nd Friday of each month, 11:45 am in the private dining room in the basement of  °• It is very important to keep all follow up appointments with your surgeon, dietitian, primary care physician, and behavioral health practitioner °• Routine follow up schedule with your surgeon include appointments at 2-3 weeks, 6-8 weeks, 6 months, and 1 year at a minimum.  Your surgeon may request to see you more often.   °o After the first year, please follow up with your bariatric surgeon and dietitian at least once a year in order to maintain best weight loss results °[facility]: [phone] °[facility]: [phone] °Bariatric Nurse Coordinator: [phone] °  °   Reviewed and Endorsed  °by [facility] Patient Education Committee, June, 2016 °Edits Approved: Aug, 2018 ° ° ° °

## 2018-06-25 NOTE — Transfer of Care (Signed)
Immediate Anesthesia Transfer of Care Note  Patient: Sheryl Cooper  Procedure(s) Performed: LAPAROSCOPIC GASTRIC SLEEVE RESECTION, UPPER ENDO, ERAS PATHWAY (N/A )  Patient Location: PACU  Anesthesia Type:General  Level of Consciousness: awake, alert  and oriented  Airway & Oxygen Therapy: Patient Spontanous Breathing and Patient connected to face mask oxygen  Post-op Assessment: Report given to RN and Post -op Vital signs reviewed and stable  Post vital signs: Reviewed and stable  Last Vitals:  Vitals Value Taken Time  BP 151/77 06/25/2018  2:41 PM  Temp    Pulse 87 06/25/2018  2:43 PM  Resp 15 06/25/2018  2:43 PM  SpO2 99 % 06/25/2018  2:43 PM  Vitals shown include unvalidated device data.  Last Pain:  Vitals:   06/25/18 1048  TempSrc:   PainSc: 0-No pain      Patients Stated Pain Goal: 4 (15/94/58 5929)  Complications: No apparent anesthesia complications

## 2018-06-25 NOTE — Op Note (Signed)
Preoperative Diagnosis: Morbid Obesity, HTN, Back Pain  Postoprative Diagnosis: Morbid Obesity, HTN, Back Pain  Procedure: Procedure(s): LAPAROSCOPIC GASTRIC SLEEVE RESECTION, UPPER ENDO, ERAS PATHWAY   Surgeon: Excell Seltzer T   Assistants: Johnathan Hausen  Anesthesia:  General endotracheal anesthesia  Indications: Patient with progressive morbid obesity unresponsive to multiple efforts at medical management who presents with a BMI of 44.4 and comorbidities of degenerative joint disease and hypertension.  After extensive preoperative work-up and discussion detailed elsewhere we have elected to proceed with laparoscopic sleeve gastrectomy as treatment for her morbid obesity.   Procedure Detail: Patient was brought to the operating room, placed in the supine position on the operating table and general endotracheal anesthesia induced.  She received subcutaneous heparin and intravenous broad-spectrum antibiotics.  PAS were in place.  The abdomen was widely sterilely prepped and draped.  Patient timeout was performed and correct procedure verified.  Access was obtained with a 5 mm Optiview trocar in the left upper quadrant without difficulty and pneumoperitoneum established.  There was no evidence of injury.  Under direct vision a 5 mm trocar was placed laterally in the right upper quadrant and a 15 mm trocar in the right upper abdomen at the base of the falciform ligament.  A 5 mm port was placed above the left of the umbilicus for the camera.  The Nathanson retractor was placed to a 5 mm site subxiphoid in the left lobe of liver elevated with excellent exposure of the hiatus and entire stomach.  Finally a 5 mm trocar was placed laterally in the left upper quadrant.  A bilateral T AP block was performed.  This was done with dilute Exparel.  Dissection was begun along the mid greater curve with Harmonic Scalpel and the lesser sac entered.  Dissection progressed proximally with harmonic scalpel  dividing short gastric vessels.  The fundus was completely mobilized away from the spleen.  The left crus was fully dissected using the EG junction in the upper stomach fully mobilized.  Dissection then progressed distally along the greater curve with Harmonic Scalpel to a point measured 5 cm from the pylorus.  A few avascular posterior attachments were divided until the stomach was completely freed along its lesser curve vasculature.  The VISI G 74 French tube was passed orally and advanced with its tip of the pylorus, position along the lesser curve of the stomach splayed out symmetrically and placed on continuous suction.  The sleeve was begun with an initial firing of the 60 mm green stapler beginning about 5 cm from the pylorus.  The stomach was relatively and not large diameter even after giving.  A second firing of green load 60 mm stapler was used to continue the staple line up toward but staying well away from the incisura.  One 5 mm trochars on the left was exchanged to a 15 mm trocar to allow a better angle for staple firings.  A gold load stapler was then used staying away from the incisura back to more proximally.  The sleeve was then completed close to the VISI G with 3 further gold load firings and one blue load going just lateral to the esophageal fat pad at right angles to the wall of the fundus.  The staple line and abdomen were carefully inspected for hemostasis.  Sleeve was insufflated and no evidence of leak irrigation.  Dr. Hassell Done performed upper endoscopy with no evidence of leak or stricture or bleeding.  The sleeve appeared symmetrical and nicely tapered distally with  no narrowing.  The specimen was brought to the 15 mm trocar site.  Both 15 mm trocar site closed with 0 Vicryl.  Trocar sites further infiltrated with local anesthesia.  The Nathanson retractor was removed under direct vision.  Skin incisions were closed with subcuticular Monocryl and Dermabond.    Findings: As  above  Estimated Blood Loss:  less than 50 mL         Drains: None  Blood Given: none          Specimens: Greater curvature of stomach        Complications:  * No complications entered in OR log *         Disposition: PACU - hemodynamically stable.         Condition: stable

## 2018-06-26 ENCOUNTER — Encounter (HOSPITAL_COMMUNITY): Payer: Self-pay | Admitting: General Surgery

## 2018-06-26 LAB — CBC WITH DIFFERENTIAL/PLATELET
Abs Immature Granulocytes: 0.03 10*3/uL (ref 0.00–0.07)
BASOS PCT: 0 %
Basophils Absolute: 0 10*3/uL (ref 0.0–0.1)
EOS ABS: 0 10*3/uL (ref 0.0–0.5)
Eosinophils Relative: 0 %
HCT: 34.5 % — ABNORMAL LOW (ref 36.0–46.0)
Hemoglobin: 10.5 g/dL — ABNORMAL LOW (ref 12.0–15.0)
IMMATURE GRANULOCYTES: 0 %
Lymphocytes Relative: 10 %
Lymphs Abs: 0.8 10*3/uL (ref 0.7–4.0)
MCH: 26.8 pg (ref 26.0–34.0)
MCHC: 30.4 g/dL (ref 30.0–36.0)
MCV: 88 fL (ref 80.0–100.0)
Monocytes Absolute: 0.4 10*3/uL (ref 0.1–1.0)
Monocytes Relative: 6 %
NEUTROS PCT: 84 %
Neutro Abs: 6.3 10*3/uL (ref 1.7–7.7)
PLATELETS: 311 10*3/uL (ref 150–400)
RBC: 3.92 MIL/uL (ref 3.87–5.11)
RDW: 15.7 % — AB (ref 11.5–15.5)
WBC: 7.6 10*3/uL (ref 4.0–10.5)
nRBC: 0 % (ref 0.0–0.2)

## 2018-06-26 NOTE — Progress Notes (Signed)
Patient alert and oriented, Post op day 1.  Provided support and encouragement.  Encouraged pulmonary toilet, ambulation and small sips of liquids.  Patient completed 8  Ounces of water.  All questions answered.  Will continue to monitor.

## 2018-06-26 NOTE — Discharge Summary (Signed)
Physician Discharge Summary  Patient ID: Sheryl Cooper MRN: [id number] DOB/AGE: 1960-10-26 57 y.o.  PCP: Dickie La, MD  Admit date: 06/25/2018 Discharge date: 06/26/2018  Admission Diagnoses:  Morbid obesity  Discharge Diagnoses:  same  Active Problems:   Morbid obesity with BMI of 40.0-44.9, adult [facility])   Surgery:  Lap sleeve gastrectomy  Discharged Condition: improved  Hospital Course:   Had surgery.  Begun on liquids and advanced and did well.  Ready for discharge on PD 1  Consults: none  Significant Diagnostic Studies: none    Discharge Exam: Blood pressure (!) 148/68, pulse 67, temperature 98.4 F (36.9 C), temperature source Oral, resp. rate 17, height 5\' 5"  (1.651 m), weight 117.5 kg, last menstrual period 08/17/2012, SpO2 98 %. Incisions OK with minimal pain  Disposition: Discharge disposition: 01-Home or Self Care       Discharge Instructions    Ambulate hourly while awake   Complete by:  As directed    Call MD for:  difficulty breathing, headache or visual disturbances   Complete by:  As directed    Call MD for:  persistant dizziness or light-headedness   Complete by:  As directed    Call MD for:  persistant nausea and vomiting   Complete by:  As directed    Call MD for:  redness, tenderness, or signs of infection (pain, swelling, redness, odor or green/yellow discharge around incision site)   Complete by:  As directed    Call MD for:  severe uncontrolled pain   Complete by:  As directed    Call MD for:  temperature >101 F   Complete by:  As directed    Diet bariatric full liquid   Complete by:  As directed    Incentive spirometry   Complete by:  As directed    Perform hourly while awake     Allergies as of 06/26/2018      Reactions   Diflucan [fluconazole] Itching   Sulfa Antibiotics Hives      Medication List    STOP taking these medications   Insulin Pen Needle 32G X 4 MM Misc     TAKE these medications   ASPIR-LOW 81 MG  EC tablet Generic drug:  aspirin TAKE 1 TABLET BY MOUTH DAILY.   buPROPion 150 MG 12 hr tablet Commonly known as:  WELLBUTRIN SR Take 300 mg by mouth every morning.   FLUoxetine 20 MG capsule Commonly known as:  PROZAC TAKE 3 CAPSULES BY MOUTH DAILY.   fluticasone 50 MCG/ACT nasal spray Commonly known as:  FLONASE Place 2 sprays into both nostrils daily. What changed:    when to take this  reasons to take this   hydrochlorothiazide 25 MG tablet Commonly known as:  HYDRODIURIL TAKE 1 TABLET (25 MG TOTAL) BY MOUTH DAILY. Notes to patient:  Monitor Blood Pressure Daily and keep a log for primary care physician.  Monitor for symptoms of dehydration.  You may need to make changes to your medications with rapid weight loss.     losartan 50 MG tablet Commonly known as:  COZAAR Take 1 tablet (50 mg total) by mouth daily. Notes to patient:  Monitor Blood Pressure Daily and keep a log for primary care physician.  You may need to make changes to your medications with rapid weight loss.     meloxicam 15 MG tablet Commonly known as:  MOBIC TAKE 1 TABLET BY MOUTH DAILY WITH FOOD Notes to patient:  Avoid NSAIDs for 6-8  weeks after surgery   methylphenidate 20 MG tablet Commonly known as:  RITALIN Take 3 tabs by mouth in AM What changed:    how much to take  how to take this  when to take this   pantoprazole 40 MG tablet Commonly known as:  PROTONIX TAKE 1 TABLET BY MOUTH EVERY MORNING 30 MINUTES PRIOR TO BREAKFAST.   rOPINIRole 1 MG tablet Commonly known as:  REQUIP TAKE 1 TABLET BY MOUTH AT BEDTIME.   SAXENDA 18 MG/3ML Sopn Generic drug:  Liraglutide -Weight Management Inject 1.5-3 mg into the skin daily. Inject 1.5 mg into skin daily Notes to patient:  Monitor Blood Sugar Frequently and keep a log for primary care physician, you may need to adjust medication dosage with rapid weight loss.     traMADol 50 MG tablet Commonly known as:  ULTRAM Take 1 tablet (50 mg total)  by mouth every 8 (eight) hours as needed. for pain      Follow-up Information    Excell Seltzer, MD. Go on 07/13/2018.   Specialty:  General Surgery Why:  at [address] information: [address] [place] [address] [phone]        Carlena Hurl, PA-C. Go on 08/16/2018.   Specialty:  General Surgery Why:  at [address] information: [place] [address] [phone]           Signed: Pedro Earls 06/26/2018, 11:41 AM

## 2018-06-26 NOTE — Progress Notes (Signed)
Pt started drinking first 2 oz cup of protein at 1043.

## 2018-06-26 NOTE — Progress Notes (Signed)

## 2018-06-26 NOTE — Progress Notes (Signed)
Admit for laparoscopic sleeve gastrectomy.  I received a referral for this patient for recommendations for Diabetes management after discharge, however, I do not see that patient has a History of Diabetes and has NO Diabetes medications listed on Home Med Rec.  No lab glucose data or CBGs completed today.  Last lab glucose on file was 87 mg/dl on 06/21/2018 at 12:23pm.  Signing off for now.  Please re-consult if needed.     --Will follow patient during hospitalization--  Wyn Quaker RN, MSN, CDE Diabetes Coordinator Inpatient Glycemic Control Team Team Pager: [phone] (8a-5p)

## 2018-06-26 NOTE — Progress Notes (Signed)
Discharge instructions given to pt and all questions were answered. Pt taken down via wheelchair and was picked up by her daughter.  

## 2018-06-26 NOTE — Plan of Care (Signed)

## 2018-06-27 ENCOUNTER — Other Ambulatory Visit: Payer: Self-pay | Admitting: *Deleted

## 2018-06-27 NOTE — Patient Outreach (Addendum)
[place] [facility]) Care Management  06/27/2018  Sheryl Cooper Aug 10, 1960 [id number]  Transition of care  Referral date:06/26/18 Referral source:Hospital Business Intelligence [facility]) Report Insurance:[place] [employer]  Initial telephone outreach to complete transition of care call. Patient was admitted to [facility] on 06/25/18 for laparoscopic gastric sleeve resection and was discharged to home on 06/26/18. Subjective: No answer and HIPAA secure message left. Plan: Will send unsuccessful outreach letter to patient and attempt another outreach within 4 business days.   Barrington Ellison RN,CCM,CDE [place] Management Coordinator Office Phone [phone] Office Fax [phone]

## 2018-07-02 ENCOUNTER — Telehealth (HOSPITAL_COMMUNITY): Payer: Self-pay

## 2018-07-02 NOTE — Telephone Encounter (Addendum)
Left message with patient  to discuss post bariatric surgery follow up questions.  See below:   1.  Tell me about your pain and pain management?had to use pain medication first night now has switched to tylenol  2.  Let's talk about fluid intake.  How much total fluid are you taking in?60 ounces of fluid  3.  How much protein have you taken in the last 2 days?60 grams of protein  4.  Have you had nausea?  Tell me about when have experienced nausea and what you did to help?nausea x 1 after too much cream soup, zofran helped  5.  Has the frequency or color changed with your urine?urinates frequently light in color  6.  Tell me what your incisions look like?no problems  7.  Have you been passing gas? BM?passing gas had bm no problems  8.  If a problem or question were to arise who would you call?  Do you know contact numbers for [place], [facility], and [facility]?aware of how to contact all services  9.  How has the walking going?walking frequently  10.  How are your vitamins and calcium going?  How are you taking them?no problem with either mvi or calcium

## 2018-07-03 ENCOUNTER — Other Ambulatory Visit: Payer: Self-pay | Admitting: *Deleted

## 2018-07-03 NOTE — Patient Outreach (Signed)
[facility]) Care Management  07/03/2018  Sheryl Cooper 1960-10-05 [id number]   Subjective: Telephone call to patient's home  / mobile number, no answer, left HIPAA compliant voicemail message, and requested call back.    Objective: Per KPN (Knowledge Performance Now, point of care tool) and chart review, patient hospitalized 06/25/18-06/26/18 for morbid obesity, status post LAPAROSCOPIC GASTRIC SLEEVE RESECTION, UPPER ENDO, ERAS PATHWAY on 06/25/18.   Patient also has a history of hypertension and migraines.    Assessment:   Received UMR Preoperative / Transition of care referral on 06/20/18.  Transition of care follow up pending patient contact.        Plan: Covering RNCM has sent unsuccessful outreach  letter, [facility] pamphlet, will call patient for 3rd telephone outreach attempt, transition of care follow up, and proceed with case closure, within 10 business days if no return call.        Sheryl Cooper H. Annia Friendly, BSN, [place] Management [facility] Telephonic CM Phone: [phone] Fax: [phone]

## 2018-07-04 ENCOUNTER — Other Ambulatory Visit: Payer: Self-pay | Admitting: *Deleted

## 2018-07-04 NOTE — Patient Outreach (Signed)
[name]) Care Management  07/04/2018  Sheryl Cooper [birth year] [id number]   Subjective: Telephone call to patient's home  / mobile number, no answer, left HIPAA compliant voicemail message, and requested call back.    Objective: Per KPN (Knowledge Performance Now, point of care tool) and chart review, patient hospitalized 06/25/18-06/26/18 for morbid obesity, status post LAPAROSCOPIC GASTRIC SLEEVE RESECTION, UPPER ENDO, ERAS PATHWAY on 06/25/18.   Patient also has a history of hypertension and migraines.    Assessment:   Received UMR Preoperative / Transition of care referral on 06/20/18.  Transition of care follow up pending patient contact.        Plan: Covering RNCM has sent unsuccessful outreach  letter, [facility] pamphlet, and will proceed with case closure, within 10 business days if no return call.       Jilleen Essner H. Annia Friendly, BSN, [place] Management [facility] Telephonic CM Phone: [phone] Fax: [phone]

## 2018-07-06 ENCOUNTER — Other Ambulatory Visit: Payer: Self-pay

## 2018-07-09 MED ORDER — FLUOXETINE HCL 20 MG PO CAPS: 60.0000 mg | ORAL_CAPSULE | Freq: Every day | ORAL | 2 refills | Status: DC

## 2018-07-09 MED FILL — FLUoxetine HCL 20 MG CAPS: 20 | 30 days supply | Qty: 90 | Fill #0

## 2018-07-10 ENCOUNTER — Encounter: Payer: 59 | Attending: General Surgery | Admitting: Skilled Nursing Facility1

## 2018-07-10 ENCOUNTER — Other Ambulatory Visit: Payer: Self-pay | Admitting: *Deleted

## 2018-07-10 DIAGNOSIS — Z6841 Body Mass Index (BMI) 40.0 and over, adult: Secondary | ICD-10-CM | POA: Insufficient documentation

## 2018-07-10 DIAGNOSIS — Z713 Dietary counseling and surveillance: Secondary | ICD-10-CM | POA: Diagnosis not present

## 2018-07-10 DIAGNOSIS — E669 Obesity, unspecified: Secondary | ICD-10-CM

## 2018-07-10 NOTE — Patient Outreach (Signed)
[place] [facility]) Care Management  07/10/2018  Sheryl Cooper Jun 19, 1961 [id number]   No response from patient outreach attempts will proceed with case closure.      Objective:Per KPN (Knowledge Performance Now, point of care tool) and chart review,patient hospitalized 06/25/18-06/26/18 for morbid obesity, status postLAPAROSCOPIC GASTRIC SLEEVE RESECTION, UPPER ENDO, ERAS PATHWAYon 06/25/18. Patient also has a history of hypertension and migraines.     Assessment: Received UMR Preoperative / Transition of care referral on 06/20/18. Transition of care follow up not completed due to unable to contact patient and will proceed with case closure.       Plan: Case closure due to unable to reach.       Vandana Haman H. Annia Friendly, BSN, [place] Management [facility] Telephonic CM Phone: [phone] Fax: [phone]

## 2018-07-12 ENCOUNTER — Encounter: Payer: Self-pay | Admitting: Skilled Nursing Facility1

## 2018-07-12 NOTE — Progress Notes (Signed)
Bariatric Class:  Appt start time: 1530 end time:  1630.  2 Week Post-Operative Nutrition Class  Patient was seen on 07/12/2018 for Post-[facility]ative [facility].   Surgery date: 06/25/2018 Surgery type: sleeve Start weight at [facility]: 264.1 Weight today: 247.6  TANITA  BODY COMP RESULTS  07/10/2018   BMI (kg/m^2) 41.2   Fat Mass (lbs) 122   Fat Free Mass (lbs) 125.6   Total Body Water (lbs) 91.4   The following the learning objectives were met by the patient during this course:  Identifies Phase 3A (Soft, High Proteins) Dietary Goals and will begin from 2 weeks post-operatively to 2 months post-operatively  Identifies appropriate sources of fluids and proteins   States protein recommendations and appropriate sources post-operatively  Identifies the need for appropriate texture modifications, mastication, and bite sizes when consuming solids  Identifies appropriate multivitamin and calcium sources post-operatively  Describes the need for physical activity post-operatively and will follow MD recommendations  States when to call healthcare provider regarding medication questions or post-operative complications  Handouts given during class include:  Phase 3A: Soft, High Protein Diet Handout  Follow-Up Plan: Patient will follow-up at [facility] in 6 weeks for 2 month post-op nutrition visit for diet advancement per MD.

## 2018-07-16 ENCOUNTER — Telehealth: Payer: Self-pay | Admitting: Skilled Nursing Facility1

## 2018-07-16 MED FILL — SAXENDA 18 MG/3 ML PEN: 18 | 30 days supply | Qty: 15 | Fill #3

## 2018-07-16 NOTE — Telephone Encounter (Signed)
RD called pt to verify fluid intake once starting soft, solid proteins 2 week post-bariatric surgery.   Daily Fluid intake: 64+ Daily Protein intake:60+  Concerns/issues:  Eating deviled eggs, refried beans. Using the app will drink a protein drink when needed.

## 2018-07-20 ENCOUNTER — Other Ambulatory Visit: Payer: Self-pay | Admitting: Family Medicine

## 2018-07-20 MED ORDER — METHYLPHENIDATE HCL 20 MG PO TABS: ORAL_TABLET | ORAL | 0 refills | Status: DC

## 2018-07-20 MED FILL — METHYLPHENIDATE 20 MG TAB: 20 | 30 days supply | Qty: 90 | Fill #0

## 2018-07-20 MED FILL — BUPROPION SR 150 MG TABLET: 150 | 30 days supply | Qty: 60 | Fill #4

## 2018-07-27 MED FILL — PANTOPRAZOLE SOD DR 40 MG T: 40 | 90 days supply | Qty: 90 | Fill #1

## 2018-08-09 ENCOUNTER — Other Ambulatory Visit: Payer: Self-pay | Admitting: Family Medicine

## 2018-08-09 MED ORDER — ASPIR-LOW 81 MG PO TBEC: 81.0000 mg | DELAYED_RELEASE_TABLET | Freq: Every day | ORAL | 3 refills | Status: DC

## 2018-08-09 MED ORDER — TRAMADOL HCL 50 MG PO TABS: 50.0000 mg | ORAL_TABLET | Freq: Three times a day (TID) | ORAL | 3 refills | Status: DC | PRN

## 2018-08-09 MED ORDER — ROPINIROLE HCL 1 MG PO TABS: 1.0000 mg | ORAL_TABLET | Freq: Every day | ORAL | 1 refills | Status: DC

## 2018-08-09 MED FILL — rOPINIRole HCL 1 MG TABS: 1 | 90 days supply | Qty: 90 | Fill #0

## 2018-08-09 MED FILL — traMADol HCL 50 MG TABS: 50 | 30 days supply | Qty: 90 | Fill #0

## 2018-08-09 MED FILL — ASPIRIN ADULT LOW STRENGTH: 81 | 90 days supply | Qty: 90 | Fill #0

## 2018-08-15 MED FILL — FLUoxetine HCL 20 MG CAPS: 20 | 30 days supply | Qty: 90 | Fill #1

## 2018-08-22 ENCOUNTER — Encounter: Payer: No Typology Code available for payment source | Attending: General Surgery | Admitting: Dietician

## 2018-08-22 ENCOUNTER — Encounter: Payer: Self-pay | Admitting: Dietician

## 2018-08-22 ENCOUNTER — Other Ambulatory Visit: Payer: Self-pay | Admitting: Family Medicine

## 2018-08-22 VITALS — Wt 234.0 lb

## 2018-08-22 DIAGNOSIS — Z6841 Body Mass Index (BMI) 40.0 and over, adult: Secondary | ICD-10-CM | POA: Insufficient documentation

## 2018-08-22 DIAGNOSIS — Z713 Dietary counseling and surveillance: Secondary | ICD-10-CM | POA: Insufficient documentation

## 2018-08-22 DIAGNOSIS — E669 Obesity, unspecified: Secondary | ICD-10-CM

## 2018-08-22 MED ORDER — ONDANSETRON HCL 4 MG PO TABS: 4.0000 mg | ORAL_TABLET | Freq: Three times a day (TID) | ORAL | 3 refills | Status: DC | PRN

## 2018-08-22 MED ORDER — METHYLPHENIDATE HCL 20 MG PO TABS: ORAL_TABLET | ORAL | 0 refills | Status: DC

## 2018-08-22 MED FILL — METHYLPHENIDATE 20 MG TAB: 20 | 30 days supply | Qty: 90 | Fill #0

## 2018-08-22 MED FILL — BUPROPION SR 150 MG TABLET: 150 | 30 days supply | Qty: 60 | Fill #5

## 2018-08-22 MED FILL — ONDANSETRON HCL 4 MG TABLET: 4 | 20 days supply | Qty: 60 | Fill #0

## 2018-08-22 NOTE — Patient Instructions (Signed)
.   Continue to aim for a minimum of 64 fluid ounces 7 days a week with at least 30 ounces being plain water . Eat non-starchy vegetables 2 times a day 7 days a week . Start out with soft cooked vegetables today and tomorrow; if tolerated, begin to eat raw vegetables including salads . Per meal/snack, eat 3 ounces of protein first then start on non-starchy vegetables; once you understand how much of your meal leads to satisfaction (and not full) while still eating 3 ounces of protein and non-starchy vegetables, you can eat them in any order (figure out how much you can eat at a time to get enough but not too much)  . Continue to aim for 30 minutes of physical activity at least 5 times a week  See you in 4 months at the 6 Month Post Op Class. Keep up the [place] work!! Call us in the meantime with any questions or concerns.

## 2018-08-22 NOTE — Progress Notes (Signed)
Bariatric Follow-Up Visit Medical Nutrition Therapy  Appt Start Time: 4:45pm End Time: 5:15pm  2 Months Post-Operative Sleeve Gastrectomy Surgery  Primary Concerns Today: meeting fluid goal every day (will hit at least 64 oz most days but not all)  Progress: Pt states she is very satisfied with her results thus far. Pt believes she is losing weight at a good rate (prefers not dropping weight too quickly) and states she has noticed weight changes mostly by being able to fit smaller sized clothing. Pt states this makes her feel good. Pt states the arthritis in her knee already feels better. Pt states she can walk a full mile without stopping now.    NUTRITION ASSESSMENT   Anthropometrics  Start weight at [facility]: 264.1 lbs (date: 02/08/2018) Today's weight: 234 lbs Weight change: -13.6 lbs (since previous visit on: 07/10/2018)  TANITA  Body Composition Results  07/10/2018 08/22/2018   BMI (kg/m2) 41.2 38.9   Fat Mass (lbs) 122 107.6   Fat Free Mass (lbs) 125.6 126.4   Total Body Water (lbs) 91.4 91.4   Total Body Water (%) - 39.1   Clinical Medical Hx: obesity, GERD, essential tremor, RLS, insomnia, PTSD, anxiety, depression Medications: see list  24-Hr Dietary Recall First Meal: protein shake (30g) + yogurt Snack: none stated  Second Meal: yogurt (or soup, or lunch meat + cheese)  Snack: none stated  Third Meal: chili (or homemade beef soup, or beans) Snack: sugar-free jello  Beverages: water + Powerade Zero + G2  Food & Nutrition Related Hx Dietary Hx: Pt states she does not tolerate eggs Pt states that "harder, chewier" animal meats (steak and chicken) do not sit as well. Pt states she prefers warm/hot foods and beverages so she incorporates soup daily. Pt states she enjoys making homemade soups and chili because she can also control the ingredients and flavors better. Pt states she has tried green beans and really enjoys them. Pt states she tried asparagus and celery but did not  tolerate those well.   Supplements: bariatric MVI + calcium  Estimated Daily Fluid Intake: 64 oz on average Estimated Daily Protein Intake: 60 g  Physical Activity  Current average weekly physical activity: walking 1 mile every other day + recumbent bike every day   Post-Op Goals Using straws: yes  Drinking while eating: no Chewing/swallowing difficulties: no Changes in vision: no Changes to mood/headaches: no Hair loss/changes to skin/nails: no Difficulty focusing/concentrating: no Sweating: no Dizziness/lightheadedness: no (once soon after surgery) Palpitations: no  Carbonated/caffeinated beverages: no N/V/D/C/Gas: nausea/vomitting (still learning what foods do/do not sit well)  Abdominal pain: no Dumping syndrome: no Last Lap-Band fill: N/A   NUTRITION DIAGNOSIS  Overweight/obesity ([place]-3.3) related to past poor dietary habits and physical inactivity as evidenced by patient w/ completed Sleeve Gastrectomy surgery following dietary guidelines for continued weight loss.   NUTRITION INTERVENTION Nutrition counseling (C-1) and education (E-2) to facilitate bariatric surgery goals, including: . Diet advancement to the next phase (phase IV) now including non-starchy vegetables . The importance of consuming adequate calories as well as certain nutrients daily due to the body's need for essential vitamins, minerals, and fats . The importance of daily physical activity and to reach a goal of at least 150 minutes of moderate to vigorous physical activity weekly (or as directed by their physician) due to benefits such as increased musculature and improved lab values  Handouts Provided Include   Phase IV: Protein + Non-Starchy Vegetables  Learning Style & Readiness for Change Teaching method utilized:  Visual & Auditory  Demonstrated degree of understanding via: Teach Back  Barriers to learning/adherence to lifestyle change: None Identified   MONITORING & EVALUATION Dietary  intake, weekly physical activity, and body weight in 4 months.  Next Steps Patient is to follow-up in 4 months for 6 month post-op class.

## 2018-08-27 ENCOUNTER — Encounter: Payer: Self-pay | Admitting: Internal Medicine

## 2018-08-29 ENCOUNTER — Other Ambulatory Visit: Payer: Self-pay | Admitting: Family Medicine

## 2018-08-29 DIAGNOSIS — Z1231 Encounter for screening mammogram for malignant neoplasm of breast: Secondary | ICD-10-CM

## 2018-08-31 ENCOUNTER — Encounter: Payer: Self-pay | Admitting: Family Medicine

## 2018-08-31 ENCOUNTER — Ambulatory Visit (INDEPENDENT_AMBULATORY_CARE_PROVIDER_SITE_OTHER): Payer: No Typology Code available for payment source | Admitting: Family Medicine

## 2018-08-31 VITALS — BP 100/70 | HR 72 | Temp 98.2°F | Wt 233.4 lb

## 2018-08-31 DIAGNOSIS — L508 Other urticaria: Secondary | ICD-10-CM | POA: Diagnosis not present

## 2018-08-31 MED ORDER — FAMOTIDINE 10 MG PO TABS: 10.0000 mg | ORAL_TABLET | Freq: Two times a day (BID) | ORAL | 3 refills | Status: DC

## 2018-08-31 MED ORDER — CETIRIZINE HCL 10 MG PO TABS: 10.0000 mg | ORAL_TABLET | Freq: Two times a day (BID) | ORAL | 11 refills | Status: DC

## 2018-08-31 NOTE — Patient Instructions (Addendum)
   I recommend you take the morning afternoon off of work.  Please go to the pharmacy to pick up your medications.  As both are available over the counter, your insurance may not cover them.  For the next 3 days take Zyrtec or cetirizine twice per day.  Then reduce this to once per day take famotidine twice per day please give me a call if your symptoms do not improve or worsen over the weekend.  If you develop swelling of your mouth tongue, strange oral sensation, difficulty breathing, wheezing or diarrhea please present to the emergency room   I am uncertain of this trigger it does not seem like this was triggered by bananas.  It may have been triggered by a new cleaning product or something else that you are exposed to.  Sometimes things were exposed to for a long time can also suddenly trigger urticaria  It was wonderful to see you today.  Thank you for choosing [facility].   Find me in my office or call [phone] if your symptoms don't improve over the weekend.   Please be sure to schedule follow up at the front  desk before you leave today.   Dorris Singh, MD  Family Medicine

## 2018-08-31 NOTE — Progress Notes (Signed)
  Patient Name: Sheryl Cooper Date of Birth: May 28, 1961 Date of Visit: 08/31/18 PCP: Dickie La, MD  Chief Complaint: rash   Subjective: Sheryl Cooper is a pleasant 58 year old woman with history significant for bariatric surgery presenting today for itching on the face.  The patient reports a week or so ago she had the onset of hives after she ate half a banana.  She attributed this to banana and has not consumed any since.  Yesterday she noticed a few hives on her face which have worsened today.  She reports these are on the right side of her face, they are extremely pruritic.  She has not tried anything for the symptoms.  She has no new pillows, no new detergents, no new hair dyes, no new shampoos, lotions, soaps, make-up products, foundations, lotions.  She is uncertain of the trigger of this.  She uses a phone a lot at work on the right side of her face.  She does not use a headset.  She does not think any new cleaning products have been used on her phone.  She cannot identify any new exposures.  She specifically denies tongue swelling, oral numbness or sensations within the mouth that are new, throat swelling, wheezing, diarrhea or other systemic symptoms.  She reports overall she feels well other than the lesions are incredibly pruritic ROS:  ROS  I have reviewed the patient's medical, surgical, family, and social history as appropriate.   Vitals:   08/31/18 0854  BP: 100/70  Pulse: 72  Temp: 98.2 F (36.8 C)  SpO2: 98%   Filed Weights   08/31/18 0854  Weight: 233 lb 6.4 oz (105.9 kg)   HEENT: Sclera anicteric. Dentition is moderate. Appears well hydrated. Neck: Supple Cardiac: Regular rate and rhythm. Normal S1/S2. No murmurs, rubs, or gallops appreciated. Lungs: Clear bilaterally to ascultation.   Skin inspected: On the right side of the zygomatic area of the face there are multiple erythematous raised wheals that are blanching in nature.  No vesicles no overlying  excoriation the ear canal and tympanic membrane bilaterally are within normal limits.  There are no lesions along the neck.  Oropharynx is benign with no swelling Tongue is normal-appearing Lung exam as above clear bilaterally without any wheezing.  Sheryl Cooper was seen today for urticaria.  Diagnoses and all orders for this visit:  Acute urticaria, unclear etiology no systemic symptoms.  We discussed the usual course of this disease.  We discussed the fact that often times that exact etiology is not identified.  We discussed the role of steroid therapy, which I typically only recommend if the patient has not tried oral antihistamines.  Phone number provided to call if the below therapies do not improve symptoms.  We reviewed closely examining her cosmetic products and other shampoos to ensure there were no new items.  Reviewed strict appropriate return precautions and reasons to present to the ED.  The patient has no systemic symptoms at this time and is well-appearing.  -     famotidine (PEPCID) 10 MG tablet; Take 1 tablet (10 mg total) by mouth 2 (two) times daily. -     cetirizine (ZYRTEC) 10 MG tablet; Take 1 tablet (10 mg total) by mouth 2 (two) times daily.  Dorris Singh, MD  Family Medicine Teaching Service

## 2018-09-05 ENCOUNTER — Ambulatory Visit (AMBULATORY_SURGERY_CENTER): Payer: Self-pay | Admitting: *Deleted

## 2018-09-05 ENCOUNTER — Telehealth: Payer: Self-pay | Admitting: *Deleted

## 2018-09-05 ENCOUNTER — Other Ambulatory Visit: Payer: Self-pay

## 2018-09-05 ENCOUNTER — Encounter: Payer: Self-pay | Admitting: Internal Medicine

## 2018-09-05 VITALS — Ht 65.0 in | Wt 231.8 lb

## 2018-09-05 DIAGNOSIS — Z86010 Personal history of colonic polyps: Secondary | ICD-10-CM

## 2018-09-05 MED ORDER — SUPREP BOWEL PREP KIT 17.5-3.13-1.6 GM/177ML PO SOLN: 1.0000 | Freq: Once | ORAL | 0 refills | Status: AC

## 2018-09-05 MED FILL — SUPREP BOWEL PREP KIT: 17.5-3.13-1 | 1 days supply | Qty: 354 | Fill #0

## 2018-09-05 NOTE — Telephone Encounter (Signed)
Patient scheduled for Colonoscopy on 09/17/18.  \Patient had a Gastric Sleeve procedure in October 2019. Since that time she breaks out in rash following egg ingestion. Patient at present is not eating any products containing eggs per MD suggestion until  Determined if eggs are the cause of the rash. Thank you for the review and response as always Margie Ege

## 2018-09-05 NOTE — Progress Notes (Signed)
No soy allergy known to patient  Patient had a gastric sleeve procedure 06/25/18, since that time she breaks out with eggs, patient is not eating anything containing eggs at the the present time. Telephone note sent to [facility] CRNA No issues with past sedation with any surgeries  or procedures, no intubation problems  No diet pills per patient No home 02 use per patient  No blood thinners per patient  Pt denies issues with constipation  Patient has had some constipation at times, will take Miralx if not bm in 2 days. Patient will take a dose of Miralax on Saturday, 09/15/18. No A fib or A flutter  EMMI video  Offered and declined

## 2018-09-06 MED FILL — HYDROCHLOROTHIAZIDE 25 MG T: 25 | 90 days supply | Qty: 90 | Fill #1 | Status: TO

## 2018-09-06 NOTE — Telephone Encounter (Signed)
Sheryl Cooper,  Thanks for your request. This pt is cleared for anesthetic care at [facility].  Thanks,   Osvaldo Angst

## 2018-09-10 NOTE — Telephone Encounter (Signed)
Noted. Thanks.

## 2018-09-17 ENCOUNTER — Encounter: Payer: Self-pay | Admitting: Internal Medicine

## 2018-09-17 ENCOUNTER — Telehealth: Payer: Self-pay | Admitting: Internal Medicine

## 2018-09-17 MED FILL — LOSARTAN POTASSIUM 50 MG TA: 50 | 90 days supply | Qty: 90 | Fill #1 | Status: TO

## 2018-09-17 MED FILL — FLUoxetine HCL 20 MG CAPS: 20 | 30 days supply | Qty: 90 | Fill #2

## 2018-09-18 ENCOUNTER — Encounter: Payer: Self-pay | Admitting: Internal Medicine

## 2018-09-24 ENCOUNTER — Other Ambulatory Visit: Payer: Self-pay | Admitting: Family Medicine

## 2018-09-24 MED ORDER — BUPROPION HCL ER (SR) 150 MG PO TB12: 300.0000 mg | ORAL_TABLET | ORAL | 3 refills | Status: DC

## 2018-09-24 MED ORDER — METHYLPHENIDATE HCL 20 MG PO TABS: ORAL_TABLET | ORAL | 0 refills | Status: DC

## 2018-09-24 MED FILL — MELOXICAM 15 MG TABLET: 15 | 90 days supply | Qty: 90 | Fill #1 | Status: TO

## 2018-09-24 MED FILL — METHYLPHENIDATE 20 MG TAB: 20 | 30 days supply | Qty: 90 | Fill #0

## 2018-09-24 MED FILL — BUPROPION HCL SR 150 MG TAB: 150 | 90 days supply | Qty: 180 | Fill #0 | Status: TO

## 2018-10-18 ENCOUNTER — Other Ambulatory Visit: Payer: Self-pay | Admitting: Family Medicine

## 2018-10-19 MED FILL — FLUoxetine HCL 20 MG CAPS: 20 | 30 days supply | Qty: 90 | Fill #0 | Status: TO

## 2018-10-24 ENCOUNTER — Other Ambulatory Visit: Payer: Self-pay | Admitting: Family Medicine

## 2018-10-24 MED ORDER — METHYLPHENIDATE HCL 20 MG PO TABS: ORAL_TABLET | ORAL | 0 refills | Status: DC

## 2018-10-24 MED FILL — METHYLPHENIDATE 20 MG TAB: 20 | 30 days supply | Qty: 90 | Fill #0

## 2018-10-30 MED FILL — rOPINIRole HCL 1 MG TABS: 1 | 90 days supply | Qty: 90 | Fill #0

## 2018-10-30 MED FILL — PANTOPRAZOLE SOD DR 40 MG T: 40 | 90 days supply | Qty: 90 | Fill #0

## 2018-10-30 MED FILL — traMADol HCL 50 MG TABS: 50 | 30 days supply | Qty: 90 | Fill #0

## 2018-11-07 ENCOUNTER — Ambulatory Visit: Payer: Self-pay | Admitting: Family Medicine

## 2018-11-23 ENCOUNTER — Other Ambulatory Visit: Payer: Self-pay | Admitting: Family Medicine

## 2018-11-23 MED FILL — ASPIRIN ADULT LOW STRENGTH: 81 | 90 days supply | Qty: 90 | Fill #0

## 2018-11-23 MED FILL — HYDROCHLOROTHIAZIDE 25 MG T: 25 | 90 days supply | Qty: 90 | Fill #0

## 2018-11-23 MED FILL — BUPROPION HCL SR 150 MG TAB: 150 | 90 days supply | Qty: 180 | Fill #0

## 2018-11-23 MED FILL — METHYLPHENIDATE 20 MG TAB: 20 | 30 days supply | Qty: 90 | Fill #0

## 2018-11-23 MED FILL — FLUoxetine HCL 20 MG CAPS: 20 | 30 days supply | Qty: 90 | Fill #0

## 2018-11-23 MED FILL — MELOXICAM 15 MG TABLET: 15 | 90 days supply | Qty: 90 | Fill #0

## 2018-11-23 MED FILL — LOSARTAN POTASSIUM 50 MG TA: 50 | 30 days supply | Qty: 30 | Fill #0

## 2018-12-04 ENCOUNTER — Telehealth: Payer: No Typology Code available for payment source | Admitting: Family Medicine

## 2018-12-04 NOTE — Progress Notes (Signed)
Testing/patient consented

## 2018-12-11 ENCOUNTER — Ambulatory Visit: Payer: Self-pay

## 2018-12-17 ENCOUNTER — Other Ambulatory Visit: Payer: Self-pay | Admitting: Family Medicine

## 2018-12-17 MED ORDER — PROPRANOLOL HCL 10 MG PO TABS: ORAL_TABLET | ORAL | 3 refills | Status: DC

## 2018-12-17 MED FILL — FLUoxetine HCL 20 MG CAPS: 20 | 30 days supply | Qty: 90 | Fill #1

## 2018-12-17 MED FILL — PROPRANOLOL 10 MG TABLET: 10 | 30 days supply | Qty: 60 | Fill #0

## 2018-12-17 MED FILL — LOSARTAN POTASSIUM 50 MG TA: 50 | 30 days supply | Qty: 30 | Fill #1

## 2018-12-17 NOTE — Progress Notes (Signed)
Having occasional episidoic anxiety--palpitations, SOB, tremor, feelings of anxiety with no known source. Several times weekly Will try inderal; low dose Discussed She will f/u with me in 1-2 weeks continue wellbutrin and other meds without change

## 2018-12-18 ENCOUNTER — Encounter: Payer: Self-pay | Admitting: Family Medicine

## 2018-12-18 ENCOUNTER — Other Ambulatory Visit: Payer: Self-pay

## 2018-12-18 ENCOUNTER — Ambulatory Visit (INDEPENDENT_AMBULATORY_CARE_PROVIDER_SITE_OTHER): Payer: No Typology Code available for payment source | Admitting: Family Medicine

## 2018-12-18 DIAGNOSIS — L600 Ingrowing nail: Secondary | ICD-10-CM

## 2018-12-18 DIAGNOSIS — L03031 Cellulitis of right toe: Secondary | ICD-10-CM

## 2018-12-18 MED ORDER — ONDANSETRON HCL 4 MG PO TABS: 4.0000 mg | ORAL_TABLET | Freq: Three times a day (TID) | ORAL | 3 refills | Status: DC | PRN

## 2018-12-18 MED ORDER — CEPHALEXIN 500 MG PO CAPS: 500.0000 mg | ORAL_CAPSULE | Freq: Three times a day (TID) | ORAL | 0 refills | Status: DC

## 2018-12-18 MED FILL — ONDANSETRON HCL 4 MG TABLET: 4 | 20 days supply | Qty: 60 | Fill #0

## 2018-12-18 MED FILL — CEPHALEXIN 500 MG CAPSULE: 500 | 5 days supply | Qty: 15 | Fill #0

## 2018-12-18 NOTE — Progress Notes (Signed)
    CHIEF COMPLAINT / HPI: Right great toe pain Has hx of ingrown nails Never hadone thispainful Noticed it last couple of days but this AM cannot wear shoe  REVIEW OF SYSTEMS: No fever Toes is swollen and tender, red  PERTINENT  PMH / PSH: I have reviewed the patient's medications, allergies, past medical and surgical history, smoking status and updated in the EMR as appropriate. nonsmoker  OBJECTIVE: GEN WD WN NAD EXT:TOE right great: curved deformed nails that are dystrophic. Medial side is significantlyingrown. Very ttp. Medial border is red, fluctuant, waarm VASC DP pulses 2+B= MSK normal ROm of right great toe taht is painless NEURO: intact sensation to soft touch toes  PROCEDURE NOTE: patient given informed consent. Signed copy in the chart. Right great toe cleansed with alcoohol 4 cc of 1% lidocaine without epinephrine used for local anesthesia (medical digital block) Medial border of nail excised to 50% of length of nail. Small amount exudate noted and was drained. Bandaid applied   ASSESSMENT / PLAN:  Ingrown toenail, infected Partial nail excision Antibiotics rtc if not improving red flags discussed May need toenail removal in future as nails are quite curved and thickened

## 2018-12-20 ENCOUNTER — Other Ambulatory Visit: Payer: Self-pay | Admitting: Family Medicine

## 2018-12-22 MED FILL — CEPHALEXIN 500 MG CAPSULE: 500 | 5 days supply | Qty: 15 | Fill #0

## 2018-12-25 ENCOUNTER — Other Ambulatory Visit: Payer: Self-pay | Admitting: Family Medicine

## 2018-12-31 ENCOUNTER — Other Ambulatory Visit: Payer: Self-pay

## 2018-12-31 MED ORDER — METHYLPHENIDATE HCL 20 MG PO TABS: ORAL_TABLET | ORAL | 0 refills | Status: DC

## 2018-12-31 MED FILL — METHYLPHENIDATE 20 MG TAB: 20 | 30 days supply | Qty: 90 | Fill #0

## 2018-12-31 NOTE — Telephone Encounter (Signed)
Please send to [place]. (pt said sorry to bother you on vacation) Thank you! Ottis Stain, CMA

## 2019-01-02 MED FILL — ONDANSETRON HCL 4 MG TABLET: 4 | 20 days supply | Qty: 60 | Fill #1

## 2019-01-10 MED FILL — PROPRANOLOL 10 MG TABLET: 10 | 30 days supply | Qty: 60 | Fill #1

## 2019-01-10 MED FILL — rOPINIRole HCL 1 MG TABS: 1 | 90 days supply | Qty: 90 | Fill #0

## 2019-01-16 ENCOUNTER — Other Ambulatory Visit: Payer: Self-pay | Admitting: Family Medicine

## 2019-01-16 MED FILL — LOSARTAN POTASSIUM 50 MG TA: 50 | 30 days supply | Qty: 30 | Fill #2

## 2019-01-16 MED FILL — FLUoxetine HCL 20 MG CAPS: 20 | 30 days supply | Qty: 90 | Fill #0

## 2019-01-30 ENCOUNTER — Telehealth: Payer: Self-pay | Admitting: *Deleted

## 2019-01-30 NOTE — Telephone Encounter (Signed)
Pt wanted to know when her last physical was, told her it was June of 2019. Sheryl Cooper, CMA

## 2019-02-04 ENCOUNTER — Other Ambulatory Visit: Payer: Self-pay | Admitting: Family Medicine

## 2019-02-04 MED ORDER — METHYLPHENIDATE HCL 20 MG PO TABS: ORAL_TABLET | ORAL | 0 refills | Status: DC

## 2019-02-04 MED FILL — METHYLPHENIDATE 20 MG TAB: 20 | 30 days supply | Qty: 90 | Fill #0

## 2019-02-06 MED FILL — PANTOPRAZOLE SOD DR 40 MG T: 40 | 90 days supply | Qty: 90 | Fill #1

## 2019-02-09 MED FILL — FLUoxetine HCL 20 MG CAPS: 20 | 30 days supply | Qty: 90 | Fill #1

## 2019-02-09 MED FILL — PROPRANOLOL 10 MG TABLET: 10 | 30 days supply | Qty: 60 | Fill #2

## 2019-02-15 MED FILL — BUPROPION HCL SR 150 MG TAB: 150 | 90 days supply | Qty: 180 | Fill #1

## 2019-02-15 MED FILL — ASPIRIN 81 MG TBEC: 81 | 90 days supply | Qty: 90 | Fill #1

## 2019-02-15 MED FILL — HYDROCHLOROTHIAZIDE 25 MG T: 25 | 90 days supply | Qty: 90 | Fill #1

## 2019-02-15 MED FILL — LOSARTAN POTASSIUM 50 MG TA: 50 | 30 days supply | Qty: 30 | Fill #3

## 2019-03-11 ENCOUNTER — Other Ambulatory Visit: Payer: Self-pay | Admitting: Family Medicine

## 2019-03-11 MED FILL — FLUoxetine HCL 20 MG CAPS: 20 | 30 days supply | Qty: 90 | Fill #0

## 2019-03-11 MED FILL — PROPRANOLOL 10 MG TABLET: 10 | 30 days supply | Qty: 60 | Fill #3

## 2019-03-12 ENCOUNTER — Other Ambulatory Visit: Payer: Self-pay | Admitting: Family Medicine

## 2019-03-12 MED ORDER — METHYLPHENIDATE HCL 20 MG PO TABS: ORAL_TABLET | ORAL | 0 refills | Status: DC

## 2019-03-12 MED ORDER — TRAMADOL HCL 50 MG PO TABS: 50.0000 mg | ORAL_TABLET | Freq: Three times a day (TID) | ORAL | 3 refills | Status: DC | PRN

## 2019-03-12 MED FILL — traMADol HCL 50 MG TABS: 50 | 30 days supply | Qty: 90 | Fill #0

## 2019-03-12 MED FILL — METHYLPHENIDATE 20 MG TAB: 20 | 30 days supply | Qty: 90 | Fill #0

## 2019-03-18 MED FILL — LOSARTAN POTASSIUM 50 MG TA: 50 | 30 days supply | Qty: 30 | Fill #4

## 2019-03-26 MED FILL — LOSARTAN POTASSIUM 50 MG TA: 50 | 30 days supply | Qty: 30 | Fill #4

## 2019-04-05 ENCOUNTER — Other Ambulatory Visit: Payer: Self-pay | Admitting: Family Medicine

## 2019-04-05 MED FILL — FLUoxetine HCL 20 MG CAPS: 20 | 30 days supply | Qty: 90 | Fill #1

## 2019-04-09 MED FILL — rOPINIRole HCL 1 MG TABS: 1 | 90 days supply | Qty: 90 | Fill #0

## 2019-04-10 ENCOUNTER — Other Ambulatory Visit: Payer: Self-pay | Admitting: Family Medicine

## 2019-04-10 MED FILL — PROPRANOLOL 10 MG TABLET: 10 | 30 days supply | Qty: 60 | Fill #0

## 2019-04-24 ENCOUNTER — Other Ambulatory Visit: Payer: Self-pay | Admitting: Family Medicine

## 2019-04-24 MED ORDER — METHYLPHENIDATE HCL 20 MG PO TABS: ORAL_TABLET | ORAL | 0 refills | Status: DC

## 2019-04-24 MED FILL — MELOXICAM 15 MG TABLET: 15 | 90 days supply | Qty: 90 | Fill #1

## 2019-04-24 MED FILL — METHYLPHENIDATE 20 MG TAB: 20 | 30 days supply | Qty: 90 | Fill #0

## 2019-05-04 ENCOUNTER — Other Ambulatory Visit: Payer: Self-pay | Admitting: Family Medicine

## 2019-05-07 MED FILL — FLUoxetine HCL 20 MG CAPS: 20 | 30 days supply | Qty: 90 | Fill #0

## 2019-05-16 ENCOUNTER — Other Ambulatory Visit: Payer: Self-pay | Admitting: Family Medicine

## 2019-05-16 MED FILL — ASPIRIN 81 MG TBEC: 81 | 90 days supply | Qty: 90 | Fill #2

## 2019-05-16 MED FILL — BUPROPION HCL SR 150 MG TAB: 150 | 90 days supply | Qty: 180 | Fill #2

## 2019-05-17 MED FILL — HYDROCHLOROTHIAZIDE 25 MG T: 25 | 90 days supply | Qty: 90 | Fill #0

## 2019-05-25 MED FILL — BUPROPION HCL SR 150 MG TAB: 150 | 90 days supply | Qty: 180 | Fill #2

## 2019-05-25 MED FILL — ASPIRIN 81 MG TBEC: 81 | 90 days supply | Qty: 90 | Fill #2

## 2019-05-29 ENCOUNTER — Other Ambulatory Visit: Payer: Self-pay | Admitting: Family Medicine

## 2019-05-29 DIAGNOSIS — R109 Unspecified abdominal pain: Secondary | ICD-10-CM

## 2019-05-29 DIAGNOSIS — R140 Abdominal distension (gaseous): Secondary | ICD-10-CM

## 2019-05-29 DIAGNOSIS — R1013 Epigastric pain: Secondary | ICD-10-CM

## 2019-05-29 DIAGNOSIS — K8070 Calculus of gallbladder and bile duct without cholecystitis without obstruction: Secondary | ICD-10-CM

## 2019-05-29 MED FILL — LOSARTAN POTASSIUM 50 MG TA: 50 | 30 days supply | Qty: 30 | Fill #4

## 2019-06-04 MED FILL — PANTOPRAZOLE SOD DR 40 MG T: 40 | 90 days supply | Qty: 90 | Fill #0

## 2019-06-05 ENCOUNTER — Other Ambulatory Visit: Payer: Self-pay

## 2019-06-05 ENCOUNTER — Ambulatory Visit (INDEPENDENT_AMBULATORY_CARE_PROVIDER_SITE_OTHER): Payer: No Typology Code available for payment source | Admitting: Family Medicine

## 2019-06-05 ENCOUNTER — Encounter: Payer: Self-pay | Admitting: Family Medicine

## 2019-06-05 DIAGNOSIS — F32A Depression, unspecified: Secondary | ICD-10-CM

## 2019-06-05 DIAGNOSIS — F329 Major depressive disorder, single episode, unspecified: Secondary | ICD-10-CM

## 2019-06-05 DIAGNOSIS — F419 Anxiety disorder, unspecified: Secondary | ICD-10-CM

## 2019-06-09 NOTE — Assessment & Plan Note (Signed)
Discussed issues She will call therapist today F/u 2 weeks Greater than 50% of our 25 minute office visit was spent in counseling and education regarding these issues.

## 2019-06-09 NOTE — Progress Notes (Signed)
    CHIEF COMPLAINT / HPI:  Increased stress  REVIEW OF SYSTEMS:no SOB and no chest pain. Denies SI/HI  PERTINENT  PMH / PSH: I have reviewed the patient's medications, allergies, past medical and surgical history, smoking status and updated in the EMR as appropriate.   OBJECTIVE:  PSYCH: AxOx4. Good eye contact.. No psychomotor retardation or agitation. Appropriate speech fluency and content. Asks and answers questions appropriately. Mood is congruent.   ASSESSMENT / PLAN:   No problem-specific Assessment & Plan notes found for this encounter.

## 2019-06-10 ENCOUNTER — Other Ambulatory Visit: Payer: Self-pay | Admitting: Family Medicine

## 2019-06-10 ENCOUNTER — Ambulatory Visit (INDEPENDENT_AMBULATORY_CARE_PROVIDER_SITE_OTHER): Payer: No Typology Code available for payment source | Admitting: Psychology

## 2019-06-10 DIAGNOSIS — F331 Major depressive disorder, recurrent, moderate: Secondary | ICD-10-CM

## 2019-06-10 DIAGNOSIS — R109 Unspecified abdominal pain: Secondary | ICD-10-CM

## 2019-06-10 DIAGNOSIS — R1013 Epigastric pain: Secondary | ICD-10-CM

## 2019-06-10 DIAGNOSIS — K8070 Calculus of gallbladder and bile duct without cholecystitis without obstruction: Secondary | ICD-10-CM

## 2019-06-10 DIAGNOSIS — R140 Abdominal distension (gaseous): Secondary | ICD-10-CM

## 2019-06-10 MED ORDER — PANTOPRAZOLE SODIUM 40 MG PO TBEC: DELAYED_RELEASE_TABLET | ORAL | 1 refills | Status: DC

## 2019-06-10 MED ORDER — METHYLPHENIDATE HCL 20 MG PO TABS: ORAL_TABLET | ORAL | 0 refills | Status: DC

## 2019-06-10 MED FILL — METHYLPHENIDATE 20 MG TAB: 20 | 30 days supply | Qty: 90 | Fill #0

## 2019-06-12 ENCOUNTER — Encounter: Payer: Self-pay | Admitting: Family Medicine

## 2019-06-12 ENCOUNTER — Ambulatory Visit (INDEPENDENT_AMBULATORY_CARE_PROVIDER_SITE_OTHER): Payer: No Typology Code available for payment source | Admitting: Family Medicine

## 2019-06-12 ENCOUNTER — Other Ambulatory Visit: Payer: Self-pay

## 2019-06-12 DIAGNOSIS — F32A Depression, unspecified: Secondary | ICD-10-CM

## 2019-06-12 DIAGNOSIS — F419 Anxiety disorder, unspecified: Secondary | ICD-10-CM

## 2019-06-12 DIAGNOSIS — F329 Major depressive disorder, single episode, unspecified: Secondary | ICD-10-CM

## 2019-06-12 NOTE — Progress Notes (Signed)
    CHIEF COMPLAINT / HPI: Follow-up anxiety depression.  Has contacted therapist.  Had a great first visit.  Plans to follow-up with him in 2 weeks.  Continuing medications.  Feeling much more hopeful.  REVIEW OF SYSTEMS: Denies suicidal or homicidal ideation.  Anxiety is improved  PERTINENT  PMH / PSH: I have reviewed the patient's medications, allergies, past medical and surgical history, smoking status and updated in the EMR as appropriate.   OBJECTIVE: PSYCH: AxOx4. Good eye contact.. No psychomotor retardation or agitation. Appropriate speech fluency and content. Asks and answers questions appropriately. Mood is congruent.   ASSESSMENT / PLAN:   No problem-specific Assessment & Plan notes found for this encounter.

## 2019-06-12 NOTE — Assessment & Plan Note (Signed)
Improved.  Continue CBT.  Follow-up 1 to 2 months and as needed.

## 2019-06-22 ENCOUNTER — Other Ambulatory Visit: Payer: Self-pay | Admitting: Family Medicine

## 2019-06-24 ENCOUNTER — Ambulatory Visit: Payer: No Typology Code available for payment source | Admitting: Psychology

## 2019-06-25 MED FILL — LOSARTAN POTASSIUM 50 MG TA: 50 | 90 days supply | Qty: 90 | Fill #0

## 2019-07-02 ENCOUNTER — Other Ambulatory Visit: Payer: Self-pay | Admitting: Family Medicine

## 2019-07-02 MED FILL — rOPINIRole HCL 1 MG TABS: 1 | 90 days supply | Qty: 90 | Fill #0

## 2019-07-18 ENCOUNTER — Other Ambulatory Visit: Payer: Self-pay

## 2019-07-18 ENCOUNTER — Telehealth: Payer: Self-pay

## 2019-07-22 ENCOUNTER — Other Ambulatory Visit: Payer: Self-pay

## 2019-07-22 NOTE — Telephone Encounter (Signed)
Pt checking on med refill. Ottis Stain, CMA

## 2019-07-23 MED ORDER — FLUOXETINE HCL 20 MG PO CAPS: 60.0000 mg | ORAL_CAPSULE | Freq: Every day | ORAL | 3 refills | Status: DC

## 2019-07-23 MED FILL — FLUoxetine HCL 20 MG CAPS: 20 | 30 days supply | Qty: 90 | Fill #0

## 2019-07-24 ENCOUNTER — Other Ambulatory Visit: Payer: Self-pay | Admitting: Family Medicine

## 2019-07-24 MED ORDER — METHYLPHENIDATE HCL 20 MG PO TABS: ORAL_TABLET | ORAL | 0 refills | Status: DC

## 2019-07-24 MED FILL — METHYLPHENIDATE 20 MG TAB: 20 | 30 days supply | Qty: 90 | Fill #0

## 2019-08-10 ENCOUNTER — Inpatient Hospital Stay
Admission: RE | Admit: 2019-08-10 | Discharge: 2019-08-10 | Disposition: A | Discharge status: Home / Self Care | Payer: No Typology Code available for payment source | Source: Ambulatory Visit

## 2019-08-10 ENCOUNTER — Telehealth: Payer: Self-pay | Admitting: Family Medicine

## 2019-08-10 NOTE — Telephone Encounter (Signed)
**  After Hours/ Emergency Line Call**  Received a call to report that Sheryl Cooper reports having a "golf ball" sized swelling in mouth. It is getting worse. Pain in relieved with ibuprofen. Says she is having difficulty eating due to pain. Does not endorse any difficulty breathing or swallowing. Patient attempted to do virtual visit with urgent care, but was told her apt was cancelled due to urgent care being behind. Patient has also tried to get an appointment with dentist who said it will be several weeks before apt. Patient is requesting antibiotic prescription. Recommended that patient try to be seen in person at either urgent care or ED. Discussed with patient that it is not clinic policy to Rx antibiotics over the phone. Offered Rx for NSAID but patient declined saying that she had OTC. Red flags discussed.  Will forward to PCP.      Bonnita Hollow, MD PGY-3, [facility] 08/10/2019 4:21 PM

## 2019-08-19 ENCOUNTER — Other Ambulatory Visit: Payer: Self-pay | Admitting: Family Medicine

## 2019-08-19 MED FILL — ASPIRIN 81 MG TBEC: 81 | 90 days supply | Qty: 90 | Fill #0

## 2019-08-19 MED FILL — BUPROPION HCL SR 150 MG TAB: 150 | 90 days supply | Qty: 180 | Fill #0

## 2019-08-26 ENCOUNTER — Telehealth: Payer: No Typology Code available for payment source | Admitting: Nurse Practitioner

## 2019-08-26 DIAGNOSIS — M5441 Lumbago with sciatica, right side: Secondary | ICD-10-CM

## 2019-08-26 DIAGNOSIS — M5442 Lumbago with sciatica, left side: Secondary | ICD-10-CM | POA: Diagnosis not present

## 2019-08-26 MED ORDER — CYCLOBENZAPRINE HCL 10 MG PO TABS: 10.0000 mg | ORAL_TABLET | Freq: Three times a day (TID) | ORAL | 0 refills | Status: DC | PRN

## 2019-08-26 MED ORDER — NAPROXEN 500 MG PO TABS: 500.0000 mg | ORAL_TABLET | Freq: Two times a day (BID) | ORAL | 0 refills | Status: DC

## 2019-08-26 MED FILL — CYCLOBENZAPRINE 10 MG TAB: 10 | 10 days supply | Qty: 30 | Fill #0

## 2019-08-26 MED FILL — traMADol HCL 50 MG TABS: 50 | 30 days supply | Qty: 90 | Fill #1

## 2019-08-26 MED FILL — NAPROXEN 500 MG TABLET: 500 | 30 days supply | Qty: 60 | Fill #0

## 2019-08-26 NOTE — Progress Notes (Signed)

## 2019-09-03 ENCOUNTER — Other Ambulatory Visit: Payer: Self-pay | Admitting: Family Medicine

## 2019-09-03 MED FILL — FLUoxetine HCL 20 MG CAPS: 20 | 30 days supply | Qty: 90 | Fill #1

## 2019-09-04 ENCOUNTER — Other Ambulatory Visit: Payer: Self-pay | Admitting: Family Medicine

## 2019-09-04 MED ORDER — METHYLPHENIDATE HCL 20 MG PO TABS: ORAL_TABLET | ORAL | 0 refills | Status: DC

## 2019-09-04 MED FILL — METHYLPHENIDATE 20 MG TAB: 20 | 30 days supply | Qty: 90 | Fill #0

## 2019-09-04 NOTE — Progress Notes (Signed)
Refilled methylphenidate.  She had also requested Mobic to be refilled.  I asked her if her gastric bypass surgeon had said anything to her about not taking NSAIDs.  She said no.  We discussed and she will try going off the medicine.  If she does not have increase in knee and joint pain, then that will be fine.  If she does have increased, then we will have to speak with her gastric bypass surgeon and reconsider.

## 2019-10-11 MED FILL — FLUoxetine HCL 20 MG CAPS: 20 | 30 days supply | Qty: 90 | Fill #2

## 2019-10-11 MED FILL — PANTOPRAZOLE SOD DR 40 MG T: 40 | 90 days supply | Qty: 90 | Fill #1

## 2019-10-15 ENCOUNTER — Telehealth: Payer: No Typology Code available for payment source | Admitting: Nurse Practitioner

## 2019-10-15 DIAGNOSIS — R112 Nausea with vomiting, unspecified: Secondary | ICD-10-CM

## 2019-10-15 MED ORDER — ONDANSETRON HCL 4 MG PO TABS: 4.0000 mg | ORAL_TABLET | Freq: Three times a day (TID) | ORAL | 0 refills | Status: DC | PRN

## 2019-10-15 NOTE — Progress Notes (Signed)

## 2019-10-18 ENCOUNTER — Other Ambulatory Visit: Payer: Self-pay | Admitting: Family Medicine

## 2019-10-18 MED ORDER — METHYLPHENIDATE HCL 20 MG PO TABS: ORAL_TABLET | ORAL | 0 refills | Status: DC

## 2019-10-18 MED FILL — METHYLPHENIDATE 20 MG TAB: 20 | 30 days supply | Qty: 90 | Fill #0

## 2019-10-22 ENCOUNTER — Other Ambulatory Visit: Payer: Self-pay | Admitting: Family Medicine

## 2019-10-22 DIAGNOSIS — Z1231 Encounter for screening mammogram for malignant neoplasm of breast: Secondary | ICD-10-CM

## 2019-10-30 MED FILL — rOPINIRole HCL 1 MG TABS: 1 | 90 days supply | Qty: 90 | Fill #1

## 2019-11-11 MED FILL — ASPIRIN 81 MG TBEC: 81 | 90 days supply | Qty: 90 | Fill #1

## 2019-11-11 MED FILL — LOSARTAN POTASSIUM 50 MG TA: 50 | 90 days supply | Qty: 90 | Fill #1

## 2019-11-11 MED FILL — BUPROPION HCL SR 150 MG TAB: 150 | 90 days supply | Qty: 180 | Fill #1

## 2019-11-25 MED FILL — FLUoxetine HCL 20 MG CAPS: 20 | 30 days supply | Qty: 90 | Fill #3

## 2019-11-27 ENCOUNTER — Other Ambulatory Visit: Payer: Self-pay | Admitting: Family Medicine

## 2019-11-27 MED ORDER — METHYLPHENIDATE HCL 20 MG PO TABS: ORAL_TABLET | ORAL | 0 refills | Status: DC

## 2019-11-27 MED FILL — HYDROCHLOROTHIAZIDE 25 MG T: 25 | 90 days supply | Qty: 90 | Fill #1

## 2019-11-28 MED FILL — METHYLPHENIDATE 20 MG TAB: 20 | 30 days supply | Qty: 90 | Fill #0

## 2019-12-31 ENCOUNTER — Other Ambulatory Visit: Payer: Self-pay | Admitting: Family Medicine

## 2019-12-31 MED ORDER — METHYLPHENIDATE HCL 20 MG PO TABS: ORAL_TABLET | ORAL | 0 refills | Status: DC

## 2019-12-31 MED FILL — METHYLPHENIDATE 20 MG TAB: 20 | 30 days supply | Qty: 90 | Fill #0

## 2019-12-31 MED FILL — FLUoxetine HCL 20 MG CAPS: 20 | 30 days supply | Qty: 90 | Fill #0

## 2020-01-08 ENCOUNTER — Ambulatory Visit (INDEPENDENT_AMBULATORY_CARE_PROVIDER_SITE_OTHER): Payer: No Typology Code available for payment source | Admitting: Family Medicine

## 2020-01-08 ENCOUNTER — Encounter: Payer: Self-pay | Admitting: Family Medicine

## 2020-01-08 ENCOUNTER — Other Ambulatory Visit: Payer: Self-pay

## 2020-01-08 VITALS — BP 128/78 | HR 90 | Ht 65.0 in | Wt 207.4 lb

## 2020-01-08 DIAGNOSIS — I10 Essential (primary) hypertension: Secondary | ICD-10-CM | POA: Diagnosis not present

## 2020-01-08 DIAGNOSIS — R292 Abnormal reflex: Secondary | ICD-10-CM | POA: Diagnosis not present

## 2020-01-08 DIAGNOSIS — R269 Unspecified abnormalities of gait and mobility: Secondary | ICD-10-CM | POA: Diagnosis not present

## 2020-01-08 DIAGNOSIS — Z0000 Encounter for general adult medical examination without abnormal findings: Secondary | ICD-10-CM

## 2020-01-08 DIAGNOSIS — H5702 Anisocoria: Secondary | ICD-10-CM

## 2020-01-08 NOTE — Assessment & Plan Note (Signed)
Center for gait evaluation.  A little concerned by this description of her right foot.  It could be related to joint pains but she is also lost a lot of weight recently and may need some conditioning exercise as she was relatively immobile for several years there while she was overweight.  It may just be related to intermittent clumsiness etc.

## 2020-01-08 NOTE — Assessment & Plan Note (Signed)
We will check some labs today placed order for her mammogram.  She is due a Pap smear but wants to come back for that.  Reminded her to get her colonoscopy as well.

## 2020-01-08 NOTE — Patient Instructions (Addendum)
Please set up your mammogram and your colonoscopy ALSO see you eye doctor as one of your pupils is a little bigger (one mm on the LEFT) Add some gentle stretching qd for joint stiffness I will send you a note on your labs Great to se you!

## 2020-01-08 NOTE — Progress Notes (Signed)
    CHIEF COMPLAINT / HPI: Here for checkup.  Has been doing pretty well.  No problems with medicines.  Her weights been stable but she is concerned that she will gain weight back. 2.  Is having some clumsiness in her right foot occasionally when she is walking.  Has fallen a couple of times.  Says is very it intermittently just seems like her foot does not move is normally if she wants to.  No numbness or tingling in.  No weakness noted. 3.  Having more joint pains then usual but no fevers.  No unusual erythema of joints. 4.  Mood is stable.  Taking medicines without any problems.   PERTINENT  PMH / PSH: I have reviewed the patient's medications, allergies, past medical and surgical history, smoking status and updated in the EMR as appropriate.   OBJECTIVE:  BP 128/78   Pulse 90   Ht 5\' 5"  (1.651 m)   Wt 207 lb 6.4 oz (94.1 kg)   LMP 08/17/2012   SpO2 97%   BMI 34.51 kg/m  Vital signs reviewed. GENERAL: Well-developed, well-nourished, no acute distress. CARDIOVASCULAR: Regular rate and rhythm no murmur gallop or rub LUNGS: Clear to auscultation bilaterally, no rales or wheeze. ABDOMEN: Soft positive bowel sounds NEURO: No gross focal neurological deficits.  Her DTRs are quite brisk 3+ bilaterally in the lower extremity, 2+ upper extremity.  Normal sensation bilateral lower extremity. MSK: Movement of extremity x 4.  Normal muscle bulk and tone. HEENT: Pupils are equal round reactive to light although the left pupil is about 1 mm larger than the right.  Extraocular muscles are intact.  Neck is without lymphadenopathy, no thyromegaly.  No JVD. PSYCH: AxOx4. Good eye contact.. No psychomotor retardation or agitation. Appropriate speech fluency and content. Asks and answers questions appropriately. Mood is congruent.     ASSESSMENT / PLAN: Given her hyperreflexia bilateral lower extremity, I will just check her thyroid.  Health care maintenance We will check some labs today placed  order for her mammogram.  She is due a Pap smear but wants to come back for that.  Reminded her to get her colonoscopy as well.  Anisocoria Very slight asymmetry with the left pupil about 1 mm larger than the right.  They are both equally reactive however.  This is probably incidental finding.  She is due an eye exam and I urged her to go ahead and get that done.  Gait abnormality Center for gait evaluation.  A little concerned by this description of her right foot.  It could be related to joint pains but she is also lost a lot of weight recently and may need some conditioning exercise as she was relatively immobile for several years there while she was overweight.  It may just be related to intermittent clumsiness etc.   Dorcas Mcmurray MD

## 2020-01-08 NOTE — Assessment & Plan Note (Signed)
Very slight asymmetry with the left pupil about 1 mm larger than the right.  They are both equally reactive however.  This is probably incidental finding.  She is due an eye exam and I urged her to go ahead and get that done.

## 2020-01-09 LAB — COMPREHENSIVE METABOLIC PANEL
ALT: 12 IU/L (ref 0–32)
AST: 28 IU/L (ref 0–40)
Albumin/Globulin Ratio: 1.8 (ref 1.2–2.2)
Albumin: 4.3 g/dL (ref 3.8–4.8)
Alkaline Phosphatase: 89 IU/L (ref 48–121)
BUN/Creatinine Ratio: 17 (ref 12–28)
BUN: 18 mg/dL (ref 8–27)
Bilirubin Total: 0.5 mg/dL (ref 0.0–1.2)
CO2: 25 mmol/L (ref 20–29)
Calcium: 9.4 mg/dL (ref 8.7–10.3)
Chloride: 101 mmol/L (ref 96–106)
Creatinine, Ser: 1.05 mg/dL — ABNORMAL HIGH (ref 0.57–1.00)
GFR calc Af Amer: 66 mL/min/{1.73_m2} (ref >59)
GFR calc non Af Amer: 57 mL/min/{1.73_m2} — ABNORMAL LOW (ref >59)
Globulin, Total: 2.4 g/dL (ref 1.5–4.5)
Glucose: 72 mg/dL (ref 65–99)
Potassium: 4.6 mmol/L (ref 3.5–5.2)
Sodium: 138 mmol/L (ref 134–144)
Total Protein: 6.7 g/dL (ref 6.0–8.5)

## 2020-01-09 LAB — VITAMIN D 25 HYDROXY (VIT D DEFICIENCY, FRACTURES): Vit D, 25-Hydroxy: 28.6 ng/mL — ABNORMAL LOW (ref 30.0–100.0)

## 2020-01-09 LAB — LDL CHOLESTEROL, DIRECT: LDL Direct: 89 mg/dL (ref 0–99)

## 2020-01-09 LAB — TSH: TSH: 1.45 u[IU]/mL (ref 0.450–4.500)

## 2020-01-11 NOTE — Progress Notes (Signed)
All labs look OK. Kidney function stable but I would like to check in 6-12 months to follow. Cholesterol is Ok and vitamin D only a little low. Adding OTC vitamin D3 supplemetn is likely a good idea.

## 2020-01-22 ENCOUNTER — Encounter (HOSPITAL_COMMUNITY): Payer: Self-pay

## 2020-01-23 MED FILL — rOPINIRole HCL 1 MG TABS: 1 | 90 days supply | Qty: 90 | Fill #2

## 2020-01-29 ENCOUNTER — Other Ambulatory Visit: Payer: Self-pay

## 2020-01-29 DIAGNOSIS — R109 Unspecified abdominal pain: Secondary | ICD-10-CM

## 2020-01-29 DIAGNOSIS — R140 Abdominal distension (gaseous): Secondary | ICD-10-CM

## 2020-01-29 DIAGNOSIS — K8070 Calculus of gallbladder and bile duct without cholecystitis without obstruction: Secondary | ICD-10-CM

## 2020-01-29 DIAGNOSIS — R1013 Epigastric pain: Secondary | ICD-10-CM

## 2020-01-29 MED FILL — PANTOPRAZOLE SOD DR 40 MG T: 40 | 90 days supply | Qty: 90 | Fill #0

## 2020-01-29 MED FILL — FLUoxetine HCL 20 MG CAPS: 20 | 30 days supply | Qty: 90 | Fill #1

## 2020-01-31 MED ORDER — PANTOPRAZOLE SODIUM 40 MG PO TBEC: DELAYED_RELEASE_TABLET | ORAL | 1 refills | Status: DC

## 2020-02-06 ENCOUNTER — Other Ambulatory Visit: Payer: Self-pay | Admitting: Family Medicine

## 2020-02-06 MED ORDER — METHYLPHENIDATE HCL 20 MG PO TABS: ORAL_TABLET | ORAL | 0 refills | Status: DC

## 2020-02-06 MED FILL — METHYLPHENIDATE 20 MG TAB: 20 | 30 days supply | Qty: 90 | Fill #0

## 2020-02-10 MED FILL — ASPIRIN 81 MG TBEC: 81 | 90 days supply | Qty: 90 | Fill #2

## 2020-02-10 MED FILL — BUPROPION HCL SR 150 MG TAB: 150 | 90 days supply | Qty: 180 | Fill #2

## 2020-02-25 ENCOUNTER — Ambulatory Visit: Payer: No Typology Code available for payment source | Attending: Internal Medicine

## 2020-02-25 DIAGNOSIS — Z23 Encounter for immunization: Secondary | ICD-10-CM

## 2020-02-25 NOTE — Progress Notes (Signed)
   Covid-19 Vaccination Clinic  Name:  Sheryl Cooper    MRN: [id number] DOB: [birth year]  02/25/2020  Sheryl Cooper was observed post Covid-19 immunization for 15 minutes without incident. She was provided with Vaccine Information Sheet and instruction to access the V-Safe system.   Sheryl Cooper was instructed to call 911 with any severe reactions post vaccine: Marland Kitchen Difficulty breathing  . Swelling of face and throat  . A fast heartbeat  . A bad rash all over body  . Dizziness and weakness   Immunizations Administered    Name Date Dose VIS Date Route   Pfizer COVID-19 Vaccine 02/25/2020  3:48 PM 0.3 mL 09/25/2018 Intramuscular   Manufacturer: [place]   Lot: [id number]   [place]: [id number]

## 2020-03-03 ENCOUNTER — Other Ambulatory Visit: Payer: Self-pay | Admitting: Family Medicine

## 2020-03-03 ENCOUNTER — Other Ambulatory Visit: Payer: Self-pay

## 2020-03-03 MED ORDER — TRAMADOL HCL 50 MG PO TABS: 50.0000 mg | ORAL_TABLET | Freq: Three times a day (TID) | ORAL | 3 refills | Status: DC | PRN

## 2020-03-03 MED FILL — LOSARTAN POTASSIUM 50 MG TA: 50 | 90 days supply | Qty: 90 | Fill #2

## 2020-03-04 MED FILL — HYDROCHLOROTHIAZIDE 25 MG T: 25 | 90 days supply | Qty: 90 | Fill #0

## 2020-03-04 MED FILL — traMADol HCL 50 MG TABS: 50 | 30 days supply | Qty: 90 | Fill #0

## 2020-03-17 ENCOUNTER — Ambulatory Visit: Payer: Self-pay

## 2020-03-19 ENCOUNTER — Other Ambulatory Visit: Payer: Self-pay | Admitting: Family Medicine

## 2020-03-19 MED ORDER — METHYLPHENIDATE HCL 20 MG PO TABS: ORAL_TABLET | ORAL | 0 refills | Status: DC

## 2020-03-19 MED ORDER — FLUOXETINE HCL 20 MG PO CAPS: 60.0000 mg | ORAL_CAPSULE | Freq: Every day | ORAL | 3 refills | Status: DC

## 2020-03-19 MED FILL — METHYLPHENIDATE HCL 20 MG T: 20 | 30 days supply | Qty: 90 | Fill #0

## 2020-03-19 MED FILL — FLUoxetine HCL 20 MG CAPS: 20 | 90 days supply | Qty: 270 | Fill #0

## 2020-04-03 NOTE — Telephone Encounter (Signed)
Error

## 2020-04-24 MED FILL — rOPINIRole HCL 1 MG TABS: 1 | 90 days supply | Qty: 90 | Fill #3

## 2020-05-07 ENCOUNTER — Other Ambulatory Visit: Payer: Self-pay | Admitting: Family Medicine

## 2020-05-07 MED ORDER — METHYLPHENIDATE HCL 20 MG PO TABS: ORAL_TABLET | ORAL | 0 refills | Status: DC

## 2020-05-07 MED FILL — METHYLPHENIDATE HCL 20 MG T: 20 | 30 days supply | Qty: 90 | Fill #0

## 2020-05-09 ENCOUNTER — Other Ambulatory Visit: Payer: Self-pay | Admitting: Family Medicine

## 2020-05-11 ENCOUNTER — Other Ambulatory Visit: Payer: Self-pay | Admitting: Family Medicine

## 2020-05-11 MED FILL — ASPIRIN 81 MG TBEC: 81 | 90 days supply | Qty: 90 | Fill #0

## 2020-05-11 MED FILL — BUPROPION HCL SR 150 MG TAB: 150 | 90 days supply | Qty: 180 | Fill #0

## 2020-06-09 ENCOUNTER — Other Ambulatory Visit: Payer: Self-pay | Admitting: Family Medicine

## 2020-06-09 ENCOUNTER — Other Ambulatory Visit: Payer: Self-pay | Admitting: *Deleted

## 2020-06-09 DIAGNOSIS — R109 Unspecified abdominal pain: Secondary | ICD-10-CM

## 2020-06-09 DIAGNOSIS — K8070 Calculus of gallbladder and bile duct without cholecystitis without obstruction: Secondary | ICD-10-CM

## 2020-06-09 DIAGNOSIS — R1013 Epigastric pain: Secondary | ICD-10-CM

## 2020-06-09 DIAGNOSIS — R140 Abdominal distension (gaseous): Secondary | ICD-10-CM

## 2020-06-09 MED ORDER — PANTOPRAZOLE SODIUM 40 MG PO TBEC: DELAYED_RELEASE_TABLET | ORAL | 1 refills | Status: DC

## 2020-06-09 MED FILL — PANTOPRAZOLE SOD DR 40 MG T: 40 | 90 days supply | Qty: 90 | Fill #0

## 2020-06-09 MED FILL — FLUARIX QUADRIVALENT 0.5 ML: 0.5 | 1 days supply | Qty: 1 | Fill #0

## 2020-06-10 ENCOUNTER — Telehealth: Payer: Self-pay

## 2020-06-10 NOTE — Telephone Encounter (Signed)
Pt having bad panic attacks. Would like Dr. Nori Riis to give her something to help. Would like to know if Dr. Nori Riis can see her either in person on by video this week. I have scheduled pt for an appt on 11/17 @ 11:30. Ottis Stain, CMA

## 2020-06-11 ENCOUNTER — Telehealth: Payer: Self-pay | Admitting: Family Medicine

## 2020-06-11 ENCOUNTER — Other Ambulatory Visit: Payer: Self-pay | Admitting: Family Medicine

## 2020-06-11 MED ORDER — HYDROXYZINE HCL 10 MG PO TABS: 10.0000 mg | ORAL_TABLET | Freq: Three times a day (TID) | ORAL | 1 refills | Status: DC | PRN

## 2020-06-11 MED FILL — hydrOXYzine HCL 10 MG TABS: 10 | 30 days supply | Qty: 90 | Fill #0

## 2020-06-11 NOTE — Telephone Encounter (Signed)
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 Daughter and her grandsonmoved to [place]. She has already been out to see them but that is a big change. Has not had panic like this for 20 years. Having to miss owrk. Worried about her job. Has started back with [facility].  Will stop requip. The propranolol is not helping so dc that as well. Can consider restart requip at later daye. Add atarax prn tid. Call [facility] today and get sooner appt. Has appt with me next week and will call in interim if things get worse. No SI/HI.

## 2020-06-17 ENCOUNTER — Ambulatory Visit (INDEPENDENT_AMBULATORY_CARE_PROVIDER_SITE_OTHER): Payer: No Typology Code available for payment source | Admitting: Family Medicine

## 2020-06-17 ENCOUNTER — Encounter: Payer: Self-pay | Admitting: Family Medicine

## 2020-06-17 ENCOUNTER — Other Ambulatory Visit: Payer: Self-pay | Admitting: Family Medicine

## 2020-06-17 ENCOUNTER — Other Ambulatory Visit: Payer: Self-pay

## 2020-06-17 DIAGNOSIS — F32A Depression, unspecified: Secondary | ICD-10-CM

## 2020-06-17 DIAGNOSIS — F419 Anxiety disorder, unspecified: Secondary | ICD-10-CM

## 2020-06-17 MED ORDER — METHYLPHENIDATE HCL 20 MG PO TABS: ORAL_TABLET | ORAL | 0 refills | Status: DC

## 2020-06-17 MED FILL — METHYLPHENIDATE HCL 20 MG T: 20 | 30 days supply | Qty: 90 | Fill #0

## 2020-06-17 NOTE — Patient Instructions (Addendum)
Ask your therapist about other options especially in person NO DRINKING Consider AA meetings or something similar Let me see you in about a month  KEEP ON doing te great things you are doing, like WALKING, taking your medicines, following your routine

## 2020-06-18 NOTE — Assessment & Plan Note (Signed)
Continue current medicine regimen.  I gave her some resources for alternative therapists so hopefully she can find one that will do in person.  Long discussion about her current issues.  I applauded her cessation of alcohol consumption.  Follow-up 1 month.

## 2020-06-18 NOTE — Progress Notes (Signed)
    CHIEF COMPLAINT / HPI: Follow-up depression anxiety.  Somewhat better since I spoke with her last week.  She reports that she had started drinking a lot of alcohol and had actually missed some work because of that.  Said she was staying in bed and not attending to every day activities such as shower, bathing, dressing.  She realized that in the last few days and has gotten all the alcohol out of her house.  She has not had any to drink in the last few days.  She is taking her medicines regularly.  No suicidal or homicidal ideation.  Her mood is better.  She is also been out walking the last 3 days and said she thinks that has helped greatly.  She has been seeing a therapist virtually and she would like to find a therapist she can see in person at least occasionally.   PERTINENT  PMH / PSH: I have reviewed the patient's medications, allergies, past medical and surgical history, smoking status and updated in the EMR as appropriate.   OBJECTIVE:  BP 124/68   Pulse 80   Ht 5\' 5"  (1.651 m)   Wt 216 lb 6.4 oz (98.2 kg)   LMP 08/17/2012   SpO2 99%   BMI 36.01 kg/m  PSYCH: AxOx4. Good eye contact.. No psychomotor retardation or agitation. Appropriate speech fluency and content. Asks and answers questions appropriately. Mood is congruent.   ASSESSMENT / PLAN:   Anxiety and depression Continue current medicine regimen.  I gave her some resources for alternative therapists so hopefully she can find one that will do in person.  Long discussion about her current issues.  I applauded her cessation of alcohol consumption.  Follow-up 1 month. 30 minutes spent in visit  Dorcas Mcmurray MD

## 2020-06-28 ENCOUNTER — Encounter: Payer: Self-pay | Admitting: Physician Assistant

## 2020-06-28 ENCOUNTER — Telehealth: Payer: No Typology Code available for payment source | Admitting: Physician Assistant

## 2020-06-28 DIAGNOSIS — M5441 Lumbago with sciatica, right side: Secondary | ICD-10-CM

## 2020-06-28 DIAGNOSIS — N39498 Other specified urinary incontinence: Secondary | ICD-10-CM

## 2020-06-28 DIAGNOSIS — R531 Weakness: Secondary | ICD-10-CM

## 2020-06-28 DIAGNOSIS — R200 Anesthesia of skin: Secondary | ICD-10-CM

## 2020-06-28 NOTE — Progress Notes (Signed)
Based on what you shared with me, I feel your condition warrants further evaluation and I recommend that you be seen for a face to face office visit.   NOTE: If you entered your credit card information for this eVisit, you will not be charged. You may see a "hold" on your card for the $35 but that hold will drop off and you will not have a charge processed.  Ms. Sheryl Cooper  Due to the severity of your symptoms and associated numbness, weakness, and incontinence, please have a face to face evaluation of your symptoms. Please go to an urgent care or the ER   If you are having a true medical emergency please call 911.      For an urgent face to face visit, [facility] has five urgent care centers for your convenience:     [facility] [place] [facility] [place] Get Driving Directions [phone] [place], [place] [address] . 10 am - 6pm Monday - Friday    [facility]) Get Driving Directions [phone] [address] [place], [place] [address] . 10 am to 8 pm Monday-Friday . 12 pm to 8 pm [facility] Get Driving Directions [phone] [place], [place], [place] [address] . 8 am to 8 pm Monday-Friday . 9 am to 6 pm Saturday . 11 am to 6 pm Sunday     [facility] Get Driving Directions  [phone] [address] [place], [place] [address] . 8 am to 8 pm Monday-Friday . 8 am to 4 pm [facility] [place] Get Driving Directions [phone] [address] [place], [place] [address] . 12 pm to 6 pm Monday-Friday      Your e-visit answers were reviewed by a board certified advanced clinical practitioner to complete your personal care plan.  Thank you for using e-Visits.    I spent 5-10 minutes on review and completion of this note- Lacy Duverney [facility]

## 2020-06-29 ENCOUNTER — Telehealth: Payer: No Typology Code available for payment source | Admitting: Family

## 2020-06-29 DIAGNOSIS — M545 Low back pain, unspecified: Secondary | ICD-10-CM

## 2020-06-29 NOTE — Progress Notes (Signed)
Based on what you shared with me, I feel your condition warrants further evaluation and I recommend that you be seen for a face to face office visit.  It looks like you were told to be seen face to face yesterday. Have you followed up? I would recommend being seen face to face to rule out other serious causes.    NOTE: If you entered your credit card information for this eVisit, you will not be charged. You may see a "hold" on your card for the $35 but that hold will drop off and you will not have a charge processed.   If you are having a true medical emergency please call 911.      For an urgent face to face visit, [facility] has five urgent care centers for your convenience:     [facility] [place] [facility] [place] Get Driving Directions [phone] [place], [place] [address] . 10 am - 6pm Monday - Friday    [facility]) Get Driving Directions [phone] [address] [place], [place] [address] . 10 am to 8 pm Monday-Friday . 12 pm to 8 pm [facility] Get Driving Directions [phone] [place], [place], [place] [address] . 8 am to 8 pm Monday-Friday . 9 am to 6 pm Saturday . 11 am to 6 pm Sunday     [facility] Get Driving Directions  [phone] [address], [place] [address] . 8 am to 8 pm Monday-Friday . 8 am to 4 pm [facility] at [place] Get Driving Directions [phone] [address] [place], [place] [address] . 12 pm to 6 pm Monday-Friday      Your e-visit answers were reviewed by a board certified advanced clinical practitioner to complete your personal care plan.  Thank you for using e-Visits.

## 2020-06-30 ENCOUNTER — Other Ambulatory Visit (HOSPITAL_COMMUNITY): Payer: Self-pay | Admitting: Student

## 2020-06-30 ENCOUNTER — Other Ambulatory Visit: Payer: Self-pay

## 2020-06-30 ENCOUNTER — Encounter (HOSPITAL_COMMUNITY): Payer: Self-pay

## 2020-06-30 ENCOUNTER — Emergency Department (HOSPITAL_COMMUNITY)
Admission: EM | Admit: 2020-06-30 | Discharge: 2020-06-30 | Disposition: A | Discharge status: Home / Self Care | Payer: No Typology Code available for payment source | Attending: Emergency Medicine | Admitting: Emergency Medicine

## 2020-06-30 DIAGNOSIS — W19XXXA Unspecified fall, initial encounter: Secondary | ICD-10-CM | POA: Insufficient documentation

## 2020-06-30 DIAGNOSIS — N3000 Acute cystitis without hematuria: Secondary | ICD-10-CM | POA: Diagnosis not present

## 2020-06-30 DIAGNOSIS — Z87891 Personal history of nicotine dependence: Secondary | ICD-10-CM | POA: Diagnosis not present

## 2020-06-30 DIAGNOSIS — Z7982 Long term (current) use of aspirin: Secondary | ICD-10-CM | POA: Insufficient documentation

## 2020-06-30 DIAGNOSIS — M79604 Pain in right leg: Secondary | ICD-10-CM | POA: Diagnosis present

## 2020-06-30 DIAGNOSIS — I10 Essential (primary) hypertension: Secondary | ICD-10-CM | POA: Diagnosis not present

## 2020-06-30 DIAGNOSIS — Z79899 Other long term (current) drug therapy: Secondary | ICD-10-CM | POA: Diagnosis not present

## 2020-06-30 LAB — URINALYSIS, ROUTINE W REFLEX MICROSCOPIC
Bilirubin Urine: NEGATIVE
Glucose, UA: NEGATIVE mg/dL
Hgb urine dipstick: NEGATIVE
Ketones, ur: 5 mg/dL — AB
Nitrite: NEGATIVE
Protein, ur: NEGATIVE mg/dL
Specific Gravity, Urine: 1.025 (ref 1.005–1.030)
WBC, UA: >50 WBC/hpf — ABNORMAL HIGH (ref 0–5)
pH: 6 (ref 5.0–8.0)

## 2020-06-30 MED ORDER — DIAZEPAM 5 MG PO TABS
5.0000 mg | ORAL_TABLET | Freq: Once | ORAL | Status: AC
  Administered 2020-06-30: 5 mg via ORAL
  Filled 2020-06-30: qty 1

## 2020-06-30 MED ORDER — CEPHALEXIN 500 MG PO CAPS: 500.0000 mg | ORAL_CAPSULE | Freq: Two times a day (BID) | ORAL | 0 refills | Status: DC

## 2020-06-30 NOTE — ED Triage Notes (Signed)
Patient arrived stating that starting on Thursday she began having pain shooting down her right leg, stating it feels like a charlie horse that will not go away. Reports she fell on Monday but walked after with no difficulty. Reports taking different OTC medications with no relief. Flew home from [place] after fall.

## 2020-06-30 NOTE — Discharge Instructions (Addendum)
Seen here for right-sided leg pain.  I have ordered a DVT study of your leg for tomorrow.  Please come in for your appointment.  I recommend taking over-the-counter pain medications like ibuprofen or Tylenol if such as needed please follow dosing the back of bottle.    Your labs show that you have a UTI of start you on antibiotics please take as prescribed.  Recommend staying hydrated as this will help with urinary symptoms.  Come back to the emergency department if you develop chest pain, shortness of breath, severe abdominal pain, uncontrolled nausea, vomiting, diarrhea.

## 2020-06-30 NOTE — ED Provider Notes (Signed)
[facility] DEPT Provider Note   CSN: [id number] Arrival date & time: 06/30/20  1902     History Chief Complaint  Patient presents with  . Leg Pain    Sheryl Cooper is a 59 y.o. female.  HPI   Patient with significant medical history of hypertension, obesity, presents to the emergency department with chief complaint of right sided leg pain. Patient states pain started on Thursday and has progressively gotten worse. She describes the pain as a charley horse-like feeling, as if her muscles are being stretched out. She feels the pain rating from her right hamstring down to her right calf. She endorses some paresthesias in her feet, denies saddle paresthesias, difficulty with urination, urinary retention, difficult bowel movements. Patient states she was in [place] and fell on her right side on Monday, was able to get up and walk on without difficulty. She denies hitting her head, losing conscious, not on anticoagulant. She endorses that she then flew home on Thursday and has been having pain since. She has no history of DVTs, PEs, is not on hormone therapy, no recent surgeries. Patient has been taking over-the-counter pain medication without any relief. Patient denies any alleviating factors at this time. Patient denies fevers, chills, shortness of breath, chest pain, abdominal pain, nausea, vomiting, diarrhea, pedal edema.  Past Medical History:  Diagnosis Date  . Abdominal distension FEW YRS AGO  . Abdominal pain, left lower quadrant 02/13/2015  . Anxiety   . Arthritis   . Chest pain FEW YRS AGO   WAS A PANIC ATTACK  . Conjunctivitis   . Episode of syncope 09/27/2010   Hx of syncope, with witnessed seizure-like activity lasting 20 minutes, and possible postictal state on 09/22/10. Evaluated in ED - Normal exam, CT head, CE, CMP, UDS, ETOH, d-dimer, UA. CBC with Hgb 10.6 (stable). EKG with prolonged QT. Marland Kitchen Because episode was possibly a seizure,  guidelines state that patient should not drive for 3 months or until seen by neurology   . GERD (gastroesophageal reflux disease)   . Hypertension   . Insomnia   . Obesity, unspecified   . PANIC ATTACK   . Restless leg syndrome     Patient Active Problem List   Diagnosis Date Noted  . Gait abnormality 01/08/2020  . Anisocoria 01/08/2020  . Benign essential tremor 12/30/2016  . Health care maintenance 10/30/2015  . Gastro-esophageal reflux disease without esophagitis 06/20/2015  . Anxiety and depression 07/08/2014  . Chronic post-traumatic stress disorder (PTSD) 06/13/2014  . Venous insufficiency 10/25/2013  . Restless leg syndrome 05/28/2013  . Insomnia 05/28/2013  . HTN (hypertension) 09/24/2010  . PANIC ATTACK 02/24/2010  . DJD (degenerative joint disease) of knee 02/24/2010    Past Surgical History:  Procedure Laterality Date  . COLONOSCOPY  07/2013   WITH POLYP REMOVAL  . ENDOMETRIAL ABLATION  YRS AGO  . LAPAROSCOPIC GASTRIC SLEEVE RESECTION N/A 06/25/2018   Procedure: LAPAROSCOPIC GASTRIC SLEEVE RESECTION, UPPER ENDO, ERAS PATHWAY;  Surgeon: Excell Seltzer, MD;  Location: [facility] ORS;  Service: General;  Laterality: N/A;  . POLYPECTOMY    . TONSILLECTOMY  1976     OB History   No obstetric history on file.     Family History  Problem Relation Age of Onset  . Diabetes Mother   . Heart disease Mother   . Hypertension Mother   . Cancer Mother        breast (74), cervical (63), skin (54)  . Colon cancer Neg Hx   .  Esophageal cancer Neg Hx   . Stomach cancer Neg Hx   . Rectal cancer Neg Hx   . Colon polyps Neg Hx     Social History   Tobacco Use  . Smoking status: Former Smoker    Packs/day: 0.50    Years: 25.00    Pack years: 12.50    Types: Cigarettes    Quit date: 09/24/1993    Years since quitting: 26.7  . Smokeless tobacco: Never Used  Vaping Use  . Vaping Use: Never used  Substance Use Topics  . Alcohol use: Yes    Comment: OCC  . Drug use:  No    Home Medications Prior to Admission medications   Medication Sig Start Date End Date Taking? Authorizing Provider  aspirin 81 MG EC tablet TAKE 1 TABLET BY MOUTH DAILY (SWALLOW WHOLE) 05/11/20   Dickie La, MD  buPROPion [facility] SR) 150 MG 12 hr tablet TAKE 1 TABLET BY MOUTH TWICE DAILY 09/25/18   Dickie La, MD  buPROPion [facility] SR) 150 MG 12 hr tablet TAKE 2 TABLETS BY MOUTH EVERY MORNING. 05/11/20   Dickie La, MD  cephALEXin (KEFLEX) 500 MG capsule Take 1 capsule (500 mg total) by mouth 2 (two) times daily for 5 days. 06/30/20 07/05/20  Marcello Fennel, PA-C  cyclobenzaprine (FLEXERIL) 10 MG tablet Take 1 tablet (10 mg total) by mouth 3 (three) times daily as needed for muscle spasms. 08/26/19   Hassell Done, Mary-Margaret, FNP  FLUoxetine (PROZAC) 20 MG capsule Take 3 capsules (60 mg total) by mouth daily. 03/19/20   Dickie La, MD  fluticasone (FLONASE) 50 MCG/ACT nasal spray Place 2 sprays into both nostrils daily. Patient taking differently: Place 2 sprays into both nostrils daily as needed for allergies.  05/04/18   Dickie La, MD  hydrochlorothiazide (HYDRODIURIL) 25 MG tablet TAKE 1 TABLET BY MOUTH ONCE DAILY 03/03/20   Dickie La, MD  hydrOXYzine (ATARAX/VISTARIL) 10 MG tablet Take 1 tablet (10 mg total) by mouth 3 (three) times daily as needed. 06/11/20   Dickie La, MD  losartan (COZAAR) 50 MG tablet TAKE 1 TABLET (50 MG TOTAL) BY MOUTH DAILY. 06/25/19   Dickie La, MD  methylphenidate (RITALIN) 20 MG tablet TAKE 3 TABLETS BY MOUTH IN THE MORNING 06/17/20   Dickie La, MD  naproxen (NAPROSYN) 500 MG tablet Take 1 tablet (500 mg total) by mouth 2 (two) times daily with a meal. 08/26/19   Hassell Done, Mary-Margaret, FNP  ondansetron (ZOFRAN) 4 MG tablet Take 1 tablet (4 mg total) by mouth every 8 (eight) hours as needed for nausea or vomiting. 10/15/19   Chevis Pretty, FNP  pantoprazole (PROTONIX) 40 MG tablet Take one by mouth daily 06/09/20   Dickie La, MD   traMADol (ULTRAM) 50 MG tablet Take 1 tablet (50 mg total) by mouth every 8 (eight) hours as needed. for pain 03/03/20   Dickie La, MD    Allergies    Diflucan [fluconazole] and Sulfa antibiotics  Review of Systems   Review of Systems  Constitutional: Negative for chills and fever.  HENT: Negative for congestion.   Respiratory: Negative for shortness of breath.   Cardiovascular: Negative for chest pain.  Gastrointestinal: Negative for abdominal pain, diarrhea and vomiting.  Genitourinary: Positive for frequency. Negative for dysuria, enuresis and flank pain.  Musculoskeletal: Negative for back pain.       Right leg pain in her calf and hamstring.  Skin: Negative for  rash.  Neurological: Negative for headaches.  Hematological: Does not bruise/bleed easily.    Physical Exam Updated Vital Signs BP 136/64   Pulse 70   Temp 97.9 F (36.6 C)   Resp 16   Ht 5\' 5"  (1.651 m)   Wt 97.5 kg   LMP 08/17/2012   SpO2 98%   BMI 35.78 kg/m   Physical Exam Vitals and nursing note reviewed.  Constitutional:      General: She is not in acute distress.    Appearance: She is not ill-appearing.  HENT:     Head: Normocephalic and atraumatic.     Nose: No congestion.  Eyes:     Conjunctiva/sclera: Conjunctivae normal.  Cardiovascular:     Rate and Rhythm: Normal rate and regular rhythm.     Pulses: Normal pulses.     Heart sounds: No murmur heard.  No friction rub. No gallop.   Pulmonary:     Effort: No respiratory distress.     Breath sounds: No wheezing, rhonchi or rales.  Abdominal:     Palpations: Abdomen is soft.     Tenderness: There is no guarding.  Musculoskeletal:        General: Tenderness present. No swelling.     Right lower leg: No edema.     Left lower leg: No edema.     Comments: Patient's lower extremities were visualized, she had 5 of 5 strength, neurovascular fully intact.   She was tender to palpation along the posterior aspect of her hamstring and  gastrocnemius. There is no noted edema, erythema, no other gross abnormalities noted.   Patient had a negative straight leg  Skin:    General: Skin is warm and dry.     Findings: No rash.  Neurological:     Mental Status: She is alert.  Psychiatric:        Mood and Affect: Mood normal.     ED Results / Procedures / Treatments   Labs (all labs ordered are listed, but only abnormal results are displayed) Labs Reviewed  URINALYSIS, ROUTINE W REFLEX MICROSCOPIC - Abnormal; Notable for the following components:      Result Value   APPearance HAZY (*)    Ketones, ur 5 (*)    Leukocytes,Ua LARGE (*)    WBC, UA >50 (*)    Bacteria, UA RARE (*)    Non Squamous Epithelial 0-5 (*)    All other components within normal limits  URINE CULTURE    EKG None  Radiology No results found.  Procedures Procedures (including critical care time)  Medications Ordered in ED Medications  diazepam (VALIUM) tablet 5 mg (5 mg Oral Given 06/30/20 2100)    ED Course  I have reviewed the triage vital signs and the nursing notes.  Pertinent labs & imaging results that were available during my care of the patient were reviewed by me and considered in my medical decision making (see chart for details).    MDM Rules/Calculators/A&P                          Patient presents with right-sided leg pain and urinary frequency. She is alert, does not appear in acute distress, vital signs reassuring. Will order UA for further evaluation.   UA is abnormal shows large leukocytes, many white blood cells, rare bacteria, moderate squamous cells.  Will culture urine for further evaluation.  Low suspicion for PE as patient denies pleuritic chest pain, shortness of  breath, patient denies leg pain, no pedal edema noted on exam, vital signs reassuring. I have low suspicion for septic arthritis as patient denies IV drug use, skin exam was performed no erythematous, edematous, warm joints noted on exam, no new heart  murmur heard on exam.  Low suspicion for fracture or dislocation as patient denies any significant trauma, she had full range of motion at her hip, knee, ankle, no gross abnormalities noted during exam.  Low suspicion for compartment syndrome as area was palpated it was soft to the touch, neurovascular fully intact.  Patient endorses frequent urination with an abnormal UA, will start her on antibiotics and culture her urine for further evaluation.  I have low suspicion for DVT, as there is no noted edema or erythema, but I cannot fully exclude without imaging.  Unfortunately vascular ultrasound is not available at this time, will order DVT study for tomorrow and have patient come in for reevaluation.  Will defer anticoagulation at this time as my suspicion for DVT is very low at this time and risks outweigh benefits.  Differential diagnosis includes DVT versus muscular strain.   Vital signs have remained stable, no indication for hospital admission.  Patient discussed with attending and they agreed with assessment and plan.  Patient given at home care as well strict return precautions.  Patient verbalized that they understood agreed to said plan.     Final Clinical Impression(s) / ED Diagnoses Final diagnoses:  Right leg pain  Acute cystitis without hematuria    Rx / DC Orders ED Discharge Orders         Ordered    VAS [place] LOWER EXTREMITY VENOUS (DVT)  Status:  Canceled        06/30/20 2031    cephALEXin (KEFLEX) 500 MG capsule  2 times daily        06/30/20 2034    LE VENOUS        06/30/20 2049           Marcello Fennel, PA-C 06/30/20 2133    Lucrezia Starch, MD 06/30/20 2200

## 2020-07-01 ENCOUNTER — Other Ambulatory Visit: Payer: Self-pay | Admitting: Nurse Practitioner

## 2020-07-01 ENCOUNTER — Telehealth: Payer: Self-pay

## 2020-07-01 ENCOUNTER — Ambulatory Visit (HOSPITAL_COMMUNITY)
Admission: RE | Admit: 2020-07-01 | Discharge: 2020-07-01 | Disposition: A | Discharge status: Home / Self Care | Payer: No Typology Code available for payment source | Source: Ambulatory Visit | Attending: Student | Admitting: Student

## 2020-07-01 ENCOUNTER — Telehealth: Payer: No Typology Code available for payment source | Admitting: Family

## 2020-07-01 DIAGNOSIS — M545 Low back pain, unspecified: Secondary | ICD-10-CM

## 2020-07-01 DIAGNOSIS — M7989 Other specified soft tissue disorders: Secondary | ICD-10-CM | POA: Insufficient documentation

## 2020-07-01 DIAGNOSIS — M79609 Pain in unspecified limb: Secondary | ICD-10-CM | POA: Diagnosis not present

## 2020-07-01 MED FILL — CEPHALEXIN 500 MG CAPSULE: 500 | 5 days supply | Qty: 10 | Fill #0

## 2020-07-01 NOTE — Progress Notes (Signed)
Lower extremity venous has been completed.   Preliminary results in CV Proc.   Abram Sander 07/01/2020 11:15 AM

## 2020-07-01 NOTE — Telephone Encounter (Signed)
Ms Sheryl Cooper calling to ask Dr. Nori Riis to look at her chart. Pt went to ED last night for leg pain and went to heart and vascular today. No blood clots in her legs. Pt was told to call PCP to ask if a muscle relaxer can be called in so she can walk. Pt describes pain "like a charlie horse that will not go away." Please advise. Ottis Stain, CMA

## 2020-07-02 ENCOUNTER — Other Ambulatory Visit: Payer: Self-pay | Admitting: Family Medicine

## 2020-07-02 LAB — URINE CULTURE: Culture: <10000 — AB

## 2020-07-02 MED ORDER — CYCLOBENZAPRINE HCL 10 MG PO TABS: 10.0000 mg | ORAL_TABLET | Freq: Three times a day (TID) | ORAL | 0 refills | Status: DC | PRN

## 2020-07-02 MED ORDER — NAPROXEN 500 MG PO TABS: 500.0000 mg | ORAL_TABLET | Freq: Two times a day (BID) | ORAL | 0 refills | Status: DC

## 2020-07-02 MED FILL — traMADol HCL 50 MG TABS: 50 | 30 days supply | Qty: 90 | Fill #1

## 2020-07-02 MED FILL — LOSARTAN POTASSIUM 50 MG TA: 50 | 90 days supply | Qty: 90 | Fill #0

## 2020-07-02 NOTE — Progress Notes (Signed)
We are sorry that you are not feeling well.  Here is how we plan to help!  Based on what you have shared with me it looks like you mostly have acute back pain.  Acute back pain is defined as musculoskeletal pain that can resolve in 1-3 weeks with conservative treatment.  I have prescribed Naprosyn 500 mg take one by mouth twice a day non-steroid anti-inflammatory (NSAID) as well as Flexeril 10 mg every eight hours as needed which is a muscle relaxer  Some patients experience stomach irritation or in increased heartburn with anti-inflammatory drugs.  Please keep in mind that muscle relaxer's can cause fatigue and should not be taken while at work or driving.  Back pain is very common.  The pain often gets better over time.  The cause of back pain is usually not dangerous.  Most people can learn to manage their back pain on their own.  I can see that you went to the ER on 11/30. If these symptoms persist, please see your PCP to have an updated X-ray of your back done.    Home Care  Stay active.  Start with short walks on flat ground if you can.  Try to walk farther each day.  Do not sit, drive or stand in one place for more than 30 minutes.  Do not stay in bed.  Do not avoid exercise or work.  Activity can help your back heal faster.  Be careful when you bend or lift an object.  Bend at your knees, keep the object close to you, and do not twist.  Sleep on a firm mattress.  Lie on your side, and bend your knees.  If you lie on your back, put a pillow under your knees.  Only take medicines as told by your doctor.  Put ice on the injured area.  Put ice in a plastic bag  Place a towel between your skin and the bag  Leave the ice on for 15-20 minutes, 3-4 times a day for the first 2-3 days. 210 After that, you can switch between ice and heat packs.  Ask your doctor about back exercises or massage.  Avoid feeling anxious or stressed.  Find good ways to deal with stress, such as  exercise.  Get Help Right Way If:  Your pain does not go away with rest or medicine.  Your pain does not go away in 1 week.  You have new problems.  You do not feel well.  The pain spreads into your legs.  You cannot control when you poop (bowel movement) or pee (urinate)  You feel sick to your stomach (nauseous) or throw up (vomit)  You have belly (abdominal) pain.  You feel like you may pass out (faint).  If you develop a fever.  Make Sure you:  Understand these instructions.  Will watch your condition  Will get help right away if you are not doing well or get worse.  Your e-visit answers were reviewed by a board certified advanced clinical practitioner to complete your personal care plan.  Depending on the condition, your plan could have included both over the counter or prescription medications.  If there is a problem please reply  once you have received a response from your provider.  Your safety is important to [place].  If you have drug allergies check your prescription carefully.    You can use MyChart to ask questions about today's visit, request a non-urgent call back, or ask for a  work or school excuse for 24 hours related to this e-Visit. If it has been greater than 24 hours you will need to follow up with your provider, or enter a new e-Visit to address those concerns.  You will get an e-mail in the next two days asking about your experience.  I hope that your e-visit has been valuable and will speed your recovery. Thank you for using e-visits.  Provider in office/ patient is at home; provider and patient are only 2 people on video call.

## 2020-07-02 NOTE — Telephone Encounter (Signed)
Dear Dema Severin Team Muscle relaxers will not be a good idea Stretching will help. Sheryl Cooper

## 2020-07-03 NOTE — Progress Notes (Signed)
Greater than 5 minutes, yet less than 10 minutes of time have been spent researching, coordinating, and implementing care for this patient.

## 2020-07-03 NOTE — Telephone Encounter (Signed)
LVM for pt to call the office. If pt calls, please give her the info from Dr. Nori Riis or have her speak to a [facility] team CMA. Ottis Stain, CMA

## 2020-07-13 NOTE — Telephone Encounter (Signed)
LVM to call office to inform pt of below, will also send a [employer] as well. Brytney Somes Zimmerman Rumple, CMA

## 2020-07-16 MED FILL — traMADol HCL 50 MG TABS: 50 | 30 days supply | Qty: 90 | Fill #1

## 2020-07-16 MED FILL — HYDROCHLOROTHIAZIDE 25 MG T: 25 | 90 days supply | Qty: 90 | Fill #1

## 2020-07-23 ENCOUNTER — Other Ambulatory Visit: Payer: Self-pay | Admitting: Family Medicine

## 2020-07-28 ENCOUNTER — Encounter: Payer: Self-pay | Admitting: Family Medicine

## 2020-07-28 ENCOUNTER — Other Ambulatory Visit: Payer: Self-pay | Admitting: Family Medicine

## 2020-07-28 MED ORDER — METHYLPHENIDATE HCL 20 MG PO TABS: ORAL_TABLET | ORAL | 0 refills | Status: DC

## 2020-07-28 MED FILL — METHYLPHENIDATE HCL 20 MG T: 20 | 30 days supply | Qty: 90 | Fill #0

## 2020-07-28 MED FILL — FLUoxetine HCL 20 MG CAPS: 20 | 90 days supply | Qty: 270 | Fill #1

## 2020-07-29 ENCOUNTER — Other Ambulatory Visit: Payer: Self-pay | Admitting: Family Medicine

## 2020-07-29 ENCOUNTER — Encounter: Payer: Self-pay | Admitting: Family Medicine

## 2020-07-29 MED ORDER — ROPINIROLE HCL 1 MG PO TABS
1.0000 mg | ORAL_TABLET | Freq: Every day | ORAL | 3 refills | Status: DC
Start: 2020-07-29 — End: 2020-07-29

## 2020-07-29 MED FILL — rOPINIRole HCL 1 MG TABS: 1 | 90 days supply | Qty: 90 | Fill #0

## 2020-08-03 MED FILL — BUPROPION HCL SR 150 MG TAB: 150 | 90 days supply | Qty: 180 | Fill #1

## 2020-08-03 MED FILL — ASPIRIN 81 MG TBEC: 81 | 90 days supply | Qty: 90 | Fill #1

## 2020-08-15 MED FILL — BUPROPION HCL SR 150 MG TAB: 150 | 90 days supply | Qty: 180 | Fill #1

## 2020-08-15 MED FILL — ASPIRIN 81 MG TBEC: 81 | 90 days supply | Qty: 90 | Fill #1

## 2020-08-18 ENCOUNTER — Ambulatory Visit: Payer: No Typology Code available for payment source | Admitting: Physician Assistant

## 2020-08-18 DIAGNOSIS — Z0289 Encounter for other administrative examinations: Secondary | ICD-10-CM

## 2020-10-08 ENCOUNTER — Other Ambulatory Visit: Payer: Self-pay | Admitting: Family Medicine

## 2020-10-08 DIAGNOSIS — Z1231 Encounter for screening mammogram for malignant neoplasm of breast: Secondary | ICD-10-CM

## 2020-10-23 MED FILL — PANTOPRAZOLE SOD DR 40 MG T: 40 | 90 days supply | Qty: 90 | Fill #1

## 2020-10-30 ENCOUNTER — Inpatient Hospital Stay: Admission: RE | Admit: 2020-10-30 | Payer: Self-pay | Source: Ambulatory Visit

## 2020-10-31 ENCOUNTER — Other Ambulatory Visit (HOSPITAL_COMMUNITY): Payer: Self-pay

## 2020-10-31 MED FILL — Pantoprazole Sodium EC Tab 40 MG (Base Equiv): ORAL | 90 days supply | Qty: 90 | Fill #0 | Status: AC

## 2020-11-02 ENCOUNTER — Other Ambulatory Visit: Payer: Self-pay | Admitting: Nurse Practitioner

## 2020-11-02 ENCOUNTER — Other Ambulatory Visit: Payer: Self-pay | Admitting: Family Medicine

## 2020-11-02 ENCOUNTER — Encounter: Payer: Self-pay | Admitting: Family Medicine

## 2020-11-03 ENCOUNTER — Other Ambulatory Visit (HOSPITAL_COMMUNITY): Payer: Self-pay

## 2020-11-03 MED ORDER — HYDROCHLOROTHIAZIDE 25 MG PO TABS
ORAL_TABLET | Freq: Every day | ORAL | 1 refills | Status: DC
  Filled 2020-11-03 (×2): qty 90, 90d supply, fill #0
  Filled 2021-03-24: qty 90, 90d supply, fill #1

## 2020-11-03 MED ORDER — TRAMADOL HCL 50 MG PO TABS
50.0000 mg | ORAL_TABLET | Freq: Three times a day (TID) | ORAL | 3 refills | Status: DC | PRN
  Filled 2020-11-03 (×2): qty 90, 30d supply, fill #0

## 2020-11-03 MED ORDER — SCOPOLAMINE 1 MG/3DAYS TD PT72
1.0000 | MEDICATED_PATCH | TRANSDERMAL | 0 refills | Status: DC
  Filled 2020-11-03: qty 3, 9d supply, fill #0
  Filled 2020-11-04: qty 7, 21d supply, fill #0

## 2020-11-03 MED ORDER — ONDANSETRON HCL 4 MG PO TABS
4.0000 mg | ORAL_TABLET | Freq: Three times a day (TID) | ORAL | 0 refills | Status: DC | PRN
  Filled 2020-11-03: qty 20, 7d supply, fill #0

## 2020-11-03 MED ORDER — ROPINIROLE HCL 1 MG PO TABS
ORAL_TABLET | Freq: Every day | ORAL | 3 refills | Status: DC
  Filled 2020-11-03 (×2): qty 90, 90d supply, fill #0
  Filled 2021-02-01: qty 90, 90d supply, fill #1
  Filled 2021-05-03: qty 90, 90d supply, fill #2
  Filled 2021-08-10: qty 90, 90d supply, fill #3

## 2020-11-03 MED ORDER — LOSARTAN POTASSIUM 50 MG PO TABS
ORAL_TABLET | Freq: Every day | ORAL | 3 refills | Status: DC
  Filled 2020-11-03 (×2): qty 90, 90d supply, fill #0
  Filled 2021-03-24: qty 90, 90d supply, fill #1
  Filled 2021-08-10: qty 90, 90d supply, fill #2

## 2020-11-04 ENCOUNTER — Other Ambulatory Visit (HOSPITAL_COMMUNITY): Payer: Self-pay

## 2020-11-12 ENCOUNTER — Other Ambulatory Visit: Payer: Self-pay

## 2020-11-12 ENCOUNTER — Other Ambulatory Visit (HOSPITAL_COMMUNITY): Payer: Self-pay

## 2020-11-12 ENCOUNTER — Ambulatory Visit (AMBULATORY_SURGERY_CENTER): Payer: Self-pay

## 2020-11-12 VITALS — Ht 65.0 in | Wt 212.0 lb

## 2020-11-12 DIAGNOSIS — Z20822 Contact with and (suspected) exposure to covid-19: Secondary | ICD-10-CM | POA: Diagnosis not present

## 2020-11-12 DIAGNOSIS — Z86010 Personal history of colonic polyps: Secondary | ICD-10-CM

## 2020-11-12 MED ORDER — NA SULFATE-K SULFATE-MG SULF 17.5-3.13-1.6 GM/177ML PO SOLN
1.0000 | Freq: Once | ORAL | 0 refills | Status: AC
  Filled 2020-11-12: qty 354, 1d supply, fill #0

## 2020-11-12 NOTE — Progress Notes (Signed)
Pt verified name, DOB, address and insurance during PV today   Pt mailed instruction packet to include, copy of consent form to read and not return, and instructions.  PV completed over the phone. Pt encouraged to call with questions or issues.   No allergies to soy or egg Pt is not on blood thinners or diet pills Denies issues with sedation/intubation Denies atrial flutter/fib Denies constipation   Pt is aware of Covid safety and care partner requirements.

## 2020-11-18 ENCOUNTER — Other Ambulatory Visit (HOSPITAL_COMMUNITY): Payer: Self-pay

## 2020-11-21 ENCOUNTER — Emergency Department (HOSPITAL_COMMUNITY): Payer: 59

## 2020-11-21 ENCOUNTER — Encounter (HOSPITAL_COMMUNITY): Payer: Self-pay | Admitting: Emergency Medicine

## 2020-11-21 ENCOUNTER — Emergency Department (HOSPITAL_COMMUNITY)
Admission: EM | Admit: 2020-11-21 | Discharge: 2020-11-22 | Disposition: A | Discharge status: Home / Self Care | Payer: 59 | Attending: Emergency Medicine | Admitting: Emergency Medicine

## 2020-11-21 DIAGNOSIS — S92002A Unspecified fracture of left calcaneus, initial encounter for closed fracture: Secondary | ICD-10-CM | POA: Diagnosis not present

## 2020-11-21 DIAGNOSIS — M25572 Pain in left ankle and joints of left foot: Secondary | ICD-10-CM | POA: Insufficient documentation

## 2020-11-21 DIAGNOSIS — S92902A Unspecified fracture of left foot, initial encounter for closed fracture: Secondary | ICD-10-CM | POA: Diagnosis not present

## 2020-11-21 DIAGNOSIS — W1839XA Other fall on same level, initial encounter: Secondary | ICD-10-CM | POA: Diagnosis not present

## 2020-11-21 DIAGNOSIS — S82301A Unspecified fracture of lower end of right tibia, initial encounter for closed fracture: Secondary | ICD-10-CM | POA: Diagnosis not present

## 2020-11-21 DIAGNOSIS — S8251XA Displaced fracture of medial malleolus of right tibia, initial encounter for closed fracture: Secondary | ICD-10-CM | POA: Diagnosis not present

## 2020-11-21 DIAGNOSIS — S82851A Displaced trimalleolar fracture of right lower leg, initial encounter for closed fracture: Secondary | ICD-10-CM | POA: Diagnosis not present

## 2020-11-21 DIAGNOSIS — Z87891 Personal history of nicotine dependence: Secondary | ICD-10-CM | POA: Insufficient documentation

## 2020-11-21 DIAGNOSIS — Z7982 Long term (current) use of aspirin: Secondary | ICD-10-CM | POA: Diagnosis not present

## 2020-11-21 DIAGNOSIS — M79671 Pain in right foot: Secondary | ICD-10-CM | POA: Insufficient documentation

## 2020-11-21 DIAGNOSIS — Z79899 Other long term (current) drug therapy: Secondary | ICD-10-CM | POA: Diagnosis not present

## 2020-11-21 DIAGNOSIS — I10 Essential (primary) hypertension: Secondary | ICD-10-CM | POA: Insufficient documentation

## 2020-11-21 DIAGNOSIS — S92022A Displaced fracture of anterior process of left calcaneus, initial encounter for closed fracture: Secondary | ICD-10-CM | POA: Diagnosis not present

## 2020-11-21 DIAGNOSIS — M1711 Unilateral primary osteoarthritis, right knee: Secondary | ICD-10-CM | POA: Diagnosis not present

## 2020-11-21 DIAGNOSIS — S82391A Other fracture of lower end of right tibia, initial encounter for closed fracture: Secondary | ICD-10-CM | POA: Diagnosis not present

## 2020-11-21 DIAGNOSIS — S8261XA Displaced fracture of lateral malleolus of right fibula, initial encounter for closed fracture: Secondary | ICD-10-CM | POA: Diagnosis not present

## 2020-11-21 DIAGNOSIS — S99911A Unspecified injury of right ankle, initial encounter: Secondary | ICD-10-CM | POA: Diagnosis present

## 2020-11-21 DIAGNOSIS — S9301XA Subluxation of right ankle joint, initial encounter: Secondary | ICD-10-CM | POA: Diagnosis not present

## 2020-11-21 DIAGNOSIS — S82831A Other fracture of upper and lower end of right fibula, initial encounter for closed fracture: Secondary | ICD-10-CM | POA: Diagnosis not present

## 2020-11-21 DIAGNOSIS — Q899 Congenital malformation, unspecified: Secondary | ICD-10-CM

## 2020-11-21 MED ORDER — PROPOFOL 500 MG/50ML IV EMUL: INTRAVENOUS | Status: AC | PRN

## 2020-11-21 MED ORDER — PROPOFOL 10 MG/ML IV BOLUS
1.0000 mg/kg | Freq: Once | INTRAVENOUS | Status: AC
  Administered 2020-11-21: 60 mg via INTRAVENOUS
  Filled 2020-11-21: qty 20

## 2020-11-21 MED ORDER — OXYCODONE HCL 5 MG PO TABS: 5.0000 mg | ORAL_TABLET | ORAL | 0 refills | Status: DC | PRN

## 2020-11-21 NOTE — ED Triage Notes (Signed)
Patient reports fall at beginning of cruise with right ankle fracture and left foot fracture. States cruise medical staff was recommending surgery. States just got back from her cruise x1 hour ago. Has XR results at bedside. Splint and boot in place.

## 2020-11-21 NOTE — ED Provider Notes (Addendum)
[place] DEPT Provider Note   CSN: [id number] Arrival date & time: 11/21/20  1856     History Chief Complaint  Patient presents with  . Ankle Pain  . Foot Pain    Sheryl Cooper is a 60 y.o. female who presents with concern for fracture to the right ankle and the left foot sustained 1 week ago.  Patient states that she was leaving for a cruise last week and immediately prior to boarding the cruise ship she stepped into a "sink hole".  She states that she felt like she rolled her right ankle and boarded the cruise ship.  Later that evening she was seen by the medical provider on the ship and was diagnosed with a distal right fibular fracture and a left cuboid fracture of the foot.  She was instructed that she needed to have surgery when they arrived to [place] but she declined.  She was placed in a posterior splint for the right ankle (no reduction attempted) and in a walking boot for the left foot.  She arrived back from her trip and off of the plane approximately an hour prior to her arrival to the emergency department.  She is taken Tylenol and tramadol for pain with relief.  Injury occurred 11/15/2018.  I personally read the patient medical record.  She is not on any anticoagulation.  HPI     Past Medical History:  Diagnosis Date  . Abdominal distension FEW YRS AGO  . Abdominal pain, left lower quadrant 02/13/2015  . Anxiety   . Arthritis   . Chest pain FEW YRS AGO   WAS A PANIC ATTACK  . Conjunctivitis   . Episode of syncope 09/27/2010   Hx of syncope, with witnessed seizure-like activity lasting 20 minutes, and possible postictal state on 09/22/10. Evaluated in ED - Normal exam, CT head, CE, CMP, UDS, ETOH, d-dimer, UA. CBC with Hgb 10.6 (stable). EKG with prolonged QT. Marland Kitchen Because episode was possibly a seizure, guidelines state that patient should not drive for 3 months or until seen by neurology   . GERD (gastroesophageal reflux disease)    . Hypertension   . Insomnia   . Obesity, unspecified   . PANIC ATTACK   . Restless leg syndrome     Patient Active Problem List   Diagnosis Date Noted  . Gait abnormality 01/08/2020  . Anisocoria 01/08/2020  . Benign essential tremor 12/30/2016  . Health care maintenance 10/30/2015  . Gastro-esophageal reflux disease without esophagitis 06/20/2015  . Anxiety and depression 07/08/2014  . Chronic post-traumatic stress disorder (PTSD) 06/13/2014  . Venous insufficiency 10/25/2013  . Restless leg syndrome 05/28/2013  . Insomnia 05/28/2013  . HTN (hypertension) 09/24/2010  . PANIC ATTACK 02/24/2010  . DJD (degenerative joint disease) of knee 02/24/2010    Past Surgical History:  Procedure Laterality Date  . COLONOSCOPY  07/2013   WITH POLYP REMOVAL  . ENDOMETRIAL ABLATION  YRS AGO  . LAPAROSCOPIC GASTRIC SLEEVE RESECTION N/A 06/25/2018   Procedure: LAPAROSCOPIC GASTRIC SLEEVE RESECTION, UPPER ENDO, ERAS PATHWAY;  Surgeon: Excell Seltzer, MD;  Location: [facility];  Service: General;  Laterality: N/A;  . POLYPECTOMY    . TONSILLECTOMY  1976     OB History   No obstetric history on file.     Family History  Problem Relation Age of Onset  . Diabetes Mother   . Heart disease Mother   . Hypertension Mother   . Cancer Mother  breast (74), cervical (76), skin (71)  . Colon cancer Neg Hx   . Esophageal cancer Neg Hx   . Stomach cancer Neg Hx   . Rectal cancer Neg Hx   . Colon polyps Neg Hx     Social History   Tobacco Use  . Smoking status: Former Smoker    Packs/day: 0.50    Years: 25.00    Pack years: 12.50    Types: Cigarettes    Quit date: 09/24/1993    Years since quitting: 27.1  . Smokeless tobacco: Never Used  Vaping Use  . Vaping Use: Never used  Substance Use Topics  . Alcohol use: Yes    Comment: OCC  . Drug use: No    Home Medications Prior to Admission medications   Medication Sig Start Date End Date Taking? Authorizing Provider   oxyCODONE (ROXICODONE) 5 MG immediate release tablet Take 1 tablet (5 mg total) by mouth every 4 (four) hours as needed for severe pain. 11/21/20  Yes Lateria Alderman, Gypsy Balsam, PA-C  aspirin 81 MG EC tablet TAKE 1 TABLET BY MOUTH DAILY (SWALLOW WHOLE) 05/11/20 05/11/21  Dickie La, MD  buPROPion [facility] SR) 150 MG 12 hr tablet TAKE 1 TABLET BY MOUTH TWICE DAILY 09/25/18   Dickie La, MD  buPROPion [facility] SR) 150 MG 12 hr tablet TAKE 2 TABLETS BY MOUTH EVERY MORNING. Patient not taking: Reported on 11/12/2020 05/11/20 05/11/21  Dickie La, MD  cephALEXin (KEFLEX) 500 MG capsule TAKE 1 CAPSULE (500 MG TOTAL) BY MOUTH 2 (TWO) TIMES DAILY FOR 5 DAYS. Patient not taking: Reported on 11/12/2020 06/30/20 06/30/21  Marcello Fennel, PA-C  cyclobenzaprine (FLEXERIL) 10 MG tablet Take 1 tablet (10 mg total) by mouth 3 (three) times daily as needed for muscle spasms. Patient not taking: Reported on 11/12/2020 07/02/20   Marrian Salvage, FNP  FLUoxetine (PROZAC) 20 MG capsule TAKE 3 CAPSULES BY MOUTH ONCE DAILY 03/19/20 03/19/21  Dickie La, MD  fluticasone [facility]) 50 MCG/ACT nasal spray Place 2 sprays into both nostrils daily. Patient not taking: Reported on 11/12/2020 05/04/18   Dickie La, MD  hydrochlorothiazide (HYDRODIURIL) 25 MG tablet TAKE 1 TABLET BY MOUTH ONCE DAILY 11/03/20 11/03/21  Dickie La, MD  hydrOXYzine (ATARAX/VISTARIL) 10 MG tablet TAKE 1 TABLET BY MOUTH 3 TIMES DAILY AS NEEDED Patient not taking: Reported on 11/12/2020 06/11/20 06/11/21  Dickie La, MD  influenza vac split quadrivalent PF (FLUARIX) 0.5 ML injection TO BE ADMINISTERED BY PHARMACIST 06/09/20 06/09/21  Carlyle Basques, MD  losartan (COZAAR) 50 MG tablet TAKE 1 TABLET BY MOUTH ONCE DAILY Patient not taking: Reported on 11/12/2020 11/03/20 11/03/21  Dickie La, MD  methylphenidate (RITALIN) 20 MG tablet TAKE 3 TABLETS BY MOUTH IN THE MORNING Patient not taking: Reported on 11/12/2020 07/28/20 01/24/21  Dickie La, MD  Multiple Vitamins-Minerals (MULTIVITAL PO) Take by mouth.    [provider]  naproxen (NAPROSYN) 500 MG tablet Take 1 tablet (500 mg total) by mouth 2 (two) times daily with a meal. Patient not taking: Reported on 11/12/2020 07/02/20   Marrian Salvage, FNP  ondansetron (ZOFRAN) 4 MG tablet Take 1 tablet (4 mg total) by mouth every 8 (eight) hours as needed for nausea or vomiting. Patient not taking: Reported on 11/12/2020 11/03/20   Dickie La, MD  pantoprazole (PROTONIX) 40 MG tablet TAKE 1 TABLET BY MOUTH ONCE DAILY 06/09/20 06/09/21  Dickie La, MD  rOPINIRole (REQUIP) 1  MG tablet TAKE 1 TABLET (1 MG TOTAL) BY MOUTH AT BEDTIME. 11/03/20 11/03/21  Dickie La, MD  scopolamine (TRANSDERM-SCOP, 1.5 MG,) 1 MG/3DAYS Place 1 patch (1.5 mg total) onto the skin every 3 (three) days. Patient not taking: Reported on 11/12/2020 11/03/20   Dickie La, MD    Allergies    Diflucan [fluconazole] and Sulfa antibiotics  Review of Systems   Review of Systems  Constitutional: Negative.   HENT: Negative.   Respiratory: Negative.   Cardiovascular: Negative.   Gastrointestinal: Negative.   Genitourinary: Negative.   Musculoskeletal: Positive for arthralgias and myalgias.  Skin: Negative.   Neurological: Negative.     Physical Exam Updated Vital Signs BP (!) 135/58   Pulse 82   Temp 98 F (36.7 C) (Oral)   Resp 13   LMP 08/17/2012   SpO2 100%   Physical Exam Vitals and nursing note reviewed.  HENT:     Head: Normocephalic and atraumatic.     Mouth/Throat:     Mouth: Mucous membranes are moist.     Pharynx: No oropharyngeal exudate or posterior oropharyngeal erythema.  Eyes:     General:        Right eye: No discharge.        Left eye: No discharge.     Conjunctiva/sclera: Conjunctivae normal.  Cardiovascular:     Rate and Rhythm: Normal rate and regular rhythm.     Pulses: Normal pulses.  Pulmonary:     Effort: Pulmonary effort is normal. No respiratory distress.      Breath sounds: Normal breath sounds. No wheezing or rales.  Abdominal:     General: Bowel sounds are normal. There is no distension.     Tenderness: There is no abdominal tenderness.  Musculoskeletal:        General: No deformity.     Cervical back: Neck supple.     Right hip: Normal.     Left hip: Normal.     Right upper leg: Normal.     Left upper leg: Normal.     Right knee: Normal.     Left knee: Normal.     Right lower leg: Normal.     Left lower leg: Normal.     Right ankle: Swelling present. Tenderness present.     Right Achilles Tendon: Normal.     Left ankle: Swelling present. Tenderness present.     Left Achilles Tendon: Normal.     Right foot: Swelling and tenderness present.     Left foot: Swelling and tenderness present.       Legs:  Skin:    General: Skin is warm and dry.  Neurological:     Mental Status: She is alert. Mental status is at baseline.  Psychiatric:        Mood and Affect: Mood normal.     ED Results / Procedures / Treatments   Labs (all labs ordered are listed, but only abnormal results are displayed) Labs Reviewed - No data to display  EKG None  Radiology DG Tibia/Fibula Right  Result Date: 11/21/2020 CLINICAL DATA:  Fracture. Fall while walking up dock on cruise ship. Injury to both ankles and right tib fib. EXAM: RIGHT TIBIA AND FIBULA - 2 VIEW COMPARISON:  None. FINDINGS: Displaced distal fibular fracture at the level of the ankle mortise. Medial malleolar fractures better assessed on concurrent ankle exam. There is a probable posterior tibial tubercle fracture. No proximal fracture of the lower leg. Osteoarthritis of the right  knee. Posterior splint material in place. Generalized soft tissue edema. IMPRESSION: Medial and lateral malleolar fractures with probable posterior tibial tubercle fracture. No additional fracture of the proximal lower leg. Electronically Signed   By: Keith Rake M.D.   On: 11/21/2020 21:11   DG Ankle Complete  Left  Result Date: 11/21/2020 CLINICAL DATA:  Fracture. Fall while walking up dock on cruise ship. Injury to both ankles and right tib fib. EXAM: LEFT ANKLE COMPLETE - 3+ VIEW COMPARISON:  None. FINDINGS: Fracture from the lateral midfoot with uncertain donor site, best appreciated on the AP view. Small curvilinear osseous density in the dorsal soft tissues above the navicular may represent avulsion fracture. Ankle mortise is preserved. Generalized soft tissue edema. Small plantar calcaneal spur and Achilles tendon enthesophyte. No convincing joint effusion. IMPRESSION: Fracture from the lateral midfoot with uncertain donor site, best appreciated on the AP view. Small curvilinear osseous density in the dorsal soft tissues above the navicular may represent avulsion fracture. This may be source of the fracture on AP view. Electronically Signed   By: Keith Rake M.D.   On: 11/21/2020 21:09   DG Ankle Complete Right  Result Date: 11/21/2020 CLINICAL DATA:  Fracture. Fall while walking up dock on cruise ship. Injury to both ankles and right tib fib. EXAM: RIGHT ANKLE - COMPLETE 3+ VIEW COMPARISON:  None. FINDINGS: Distally fibula fracture at the level of the ankle mortise. There is a transverse medial malleolar fracture. Talus is laterally subluxed with respect to the tibial plafond. Posterior tibial tubercle fracture on concurrent tib fib exam is faintly visualized. Small ankle joint effusion. Generalized soft tissue edema. Posterior splint material in place. IMPRESSION: Trimalleolar fracture with lateral subluxation of the talus with respect to the tibial plafond. Electronically Signed   By: Keith Rake M.D.   On: 11/21/2020 21:13   CT Foot Left Wo Contrast  Result Date: 11/21/2020 CLINICAL DATA:  Right ankle and left foot fracture after a fall. EXAM: CT OF THE LEFT FOOT WITHOUT CONTRAST TECHNIQUE: Multidetector CT imaging of the left foot was performed according to the standard protocol.  Multiplanar CT image reconstructions were also generated. COMPARISON:  Radiographs 11/21/2020 FINDINGS: Bones/Joint/Cartilage Mildly displaced cortical fracture arising off of the anterolateral calcaneus at the calcaneal cuboidal joint. Slight widening of the joint space. Tarsal bones, metatarsal bones, and phalangeal bones appear otherwise intact. No additional fractures are identified. Ligaments Suboptimally assessed by CT. Muscles and Tendons Visualized muscles and tendons appear intact. No intramuscular hematoma identified. Soft tissues Mild diffuse soft tissue edema most prominent over the dorsal aspect of the foot. Soft tissue calcification anterior to the cuboidal bone without underlying cortical defect, likely a phlebolith or ligamentous calcification. IMPRESSION: Mildly displaced cortical fracture arising off of the anterolateral calcaneus at the calcaneal cuboidal joint. Slight widening of the joint space. Mild diffuse soft tissue edema. Electronically Signed   By: Lucienne Capers M.D.   On: 11/21/2020 23:23   DG Foot Complete Left  Result Date: 11/21/2020 CLINICAL DATA:  Bilateral foot pain after fall. Fall on cruise ship. EXAM: LEFT FOOT - COMPLETE 3+ VIEW COMPARISON:  Ankle radiograph earlier today. FINDINGS: Fracture fragment seen laterally in the ankle radiograph likely rises from the anterolateral process of the calcaneus. Tiny osseous density is again seen in the dorsal soft tissues which may represent a dorsal avulsion fracture. Forefoot is intact. Generalized soft tissue edema. IMPRESSION: 1. Fracture fragment laterally likely rises from the anterolateral process of the calcaneus. 2. Tiny osseous density  in the dorsal soft tissues may represent a dorsal avulsion fracture. 3. Generalized soft tissue edema. Electronically Signed   By: Keith Rake M.D.   On: 11/21/2020 22:02   DG Foot Complete Right  Result Date: 11/21/2020 CLINICAL DATA:  Bilateral foot pain after fall. Fall on cruise  ship. EXAM: RIGHT FOOT COMPLETE - 3+ VIEW COMPARISON:  Ankle radiograph earlier today. FINDINGS: Distal tibia and fibular fractures seen on ankle radiograph earlier today. No evidence of additional fracture of the foot. Overlying splint material limits osseous and soft tissue fine detail. Dorsal soft tissue edema. IMPRESSION: Distal tibia and fibular fractures assessed on ankle radiograph earlier today. No additional fracture of the foot. Electronically Signed   By: Keith Rake M.D.   On: 11/21/2020 22:04    Procedures .Ortho Injury Treatment  Date/Time: 11/21/2020 11:47 PM Performed by: Emeline Darling, PA-C Authorized by: Emeline Darling, PA-C   Consent:    Consent obtained:  Verbal   Consent given by:  Patient   Risks discussed:  Fracture   Alternatives discussed:  No treatment and alternative treatmentInjury location: ankle Location details: right ankle Injury type: fracture-dislocation Fracture type: trimalleolar Pre-procedure neurovascular assessment: neurovascularly intact Pre-procedure distal perfusion: normal Pre-procedure neurological function: normal Pre-procedure range of motion: reduced  Anesthesia: Local anesthesia used: no  Patient sedated: Yes. Refer to sedation procedure documentation for details of sedation. Manipulation performed: yes X-ray confirmed reduction: yes (Mild improvement in alignment) Immobilization: splint Splint type: ankle stirrup and short leg Splint Applied by: ED Provider and Ortho Tech Supplies used: cotton padding,  Ortho-Glass and elastic bandage Post-procedure neurovascular assessment: post-procedure neurovascularly intact Post-procedure distal perfusion: normal Post-procedure neurological function: normal Post-procedure range of motion: unchanged Comments: Normal cap refill, normal sensation in all 5 digits of the right foot. Able to wiggle toes.       Medications Ordered in ED Medications  propofol (DIPRIVAN) 10  mg/mL bolus/IV push 96.2 mg (60 mg Intravenous Given 11/21/20 2345)  propofol (DIPRIVAN) 500 MG/50ML infusion ( Intravenous Canceled Entry 11/21/20 2328)    ED Course  I have reviewed the triage vital signs and the nursing notes.  Pertinent labs & imaging results that were available during my care of the patient were reviewed by me and considered in my medical decision making (see chart for details).  Clinical Course as of 11/22/20 0012  Sat Nov 21, 2020  2143 Consult to Ortho surgeon, Dr. Percell Miller, who recommends CT of the left foot for further evaluation of possible fracture of the left foot as well as reproduction of the right ankle with trimalleolar fracture.  He will plan to see the patient in his office first thing Monday morning at 830. I appreciate his collaboration in the care of this patient. Per ortho, no indication for admission. [RS]    Clinical Course User Index [RS] Keiko Myricks, Sharlene Dory   MDM Rules/Calculators/A&P                         60 year old female presents with concern for right tibial fracture and left cuboid fracture proximally 1 week ago.  Recently returned from a trip where she was diagnosed.  Vital signs are normal intake.  Grip on exam is normal, abdominal exam is benign.  Patient with right lower leg and posterior splint.  Significant edema of the foot though patient does have normal cap refill in all 5 digits and is able to move her toes normally.  Normal sensation.  Palpation over the left proximal foot over the metatarsals with some bruising but without crepitus.  Patient able to fully range the ankle and wiggle her toes.  Walking boot was removed for examination.  Plain films of the right ankle reveal trimalleolar fracture of the right leg, displaced And with lateral subluxation of the talus with respect to the tibial plafond.  Fracture fragment laterally likely right is from anterior lateral process of the calcaneus, tiny osseous density in the dorsal  soft tissues may repeat dorsal avulsion fracture. Extensive discussion with orthopedic surgeon, Dr. Percell Miller; will attempt reduction and resplinting with posterior and stirrup splints in the ED tonight.  Will obtain postreduction films and patient will see him in the office 830 Monday morning.  Additionally will obtain CT of the left foot and place patient back in the walking boot.  Trimalleolar fracture reduced as above; mild improvement.  Patient tolerated procedure well. Will place patient back in walking boot for her left foot, patient with walker at home, and prescription for oxycodone for pain.  Sheryl Cooper voiced understanding of her medical evaluation and treatment plan.  Each of her questions was answered to her expressed satisfaction.  Strict return precautions were given.  Strict Ortho follow-up precautions given.  Patient is stable and appropriate for discharge at this time.  This chart was dictated using voice recognition software, Dragon. Despite the best efforts of this provider to proofread and correct errors, errors may still occur which can change documentation meaning.  Final Clinical Impression(s) / ED Diagnoses Final diagnoses:  Trimalleolar fracture of ankle, closed, right, initial encounter  Deformity    Rx / DC Orders ED Discharge Orders         Ordered    oxyCODONE (ROXICODONE) 5 MG immediate release tablet  Every 4 hours PRN        11/21/20 2350           Yuvin Bussiere, Gypsy Balsam, PA-C 11/22/20 0012    Madisyn Mawhinney, Gypsy Balsam, PA-C 11/22/20 0016    Gareth Morgan, MD 11/24/20 1150

## 2020-11-21 NOTE — Discharge Instructions (Addendum)
You were seen in the ER for the broken bones in your ankle and your foot.  You have what is called a trimalleolar fracture of your right ankle.  Your fractures were reduced during your procedure in the emergency department today you were placed into splints which you should leave in place until you follow-up with orthopedics on Monday.  Additionally you were placed back into a walking boot for the injury to your left foot. Do not bear weight on the right foot until after you see Dr. Percell Miller and he can make his recommendations.  The orthopedic surgeon listed below, Dr. Percell Miller, he has scheduled you an appointment for 8:30 on Monday morning.  Please be sure to follow-up to that appointment.  You will likely undergo surgery early this week for definitive repair of your fractures.  You have been prescribed medication called oxycodone to help with the pain.  You may take Tylenol in addition to this. Do not take tramadol with your oxycodone.  Return to the emergency department if you develop any numbness, tingling in your foot, foot becomes purple or cold, or develop any other new severe symptoms.

## 2020-11-22 DIAGNOSIS — Z79899 Other long term (current) drug therapy: Secondary | ICD-10-CM | POA: Diagnosis not present

## 2020-11-22 DIAGNOSIS — Z7982 Long term (current) use of aspirin: Secondary | ICD-10-CM | POA: Diagnosis not present

## 2020-11-22 DIAGNOSIS — S92022A Displaced fracture of anterior process of left calcaneus, initial encounter for closed fracture: Secondary | ICD-10-CM | POA: Diagnosis not present

## 2020-11-22 DIAGNOSIS — I10 Essential (primary) hypertension: Secondary | ICD-10-CM | POA: Diagnosis not present

## 2020-11-22 DIAGNOSIS — Z87891 Personal history of nicotine dependence: Secondary | ICD-10-CM | POA: Diagnosis not present

## 2020-11-22 DIAGNOSIS — S82391A Other fracture of lower end of right tibia, initial encounter for closed fracture: Secondary | ICD-10-CM | POA: Diagnosis not present

## 2020-11-22 DIAGNOSIS — S82851A Displaced trimalleolar fracture of right lower leg, initial encounter for closed fracture: Secondary | ICD-10-CM | POA: Diagnosis not present

## 2020-11-22 DIAGNOSIS — M79671 Pain in right foot: Secondary | ICD-10-CM | POA: Diagnosis not present

## 2020-11-22 DIAGNOSIS — M25572 Pain in left ankle and joints of left foot: Secondary | ICD-10-CM | POA: Diagnosis not present

## 2020-11-22 DIAGNOSIS — S82831A Other fracture of upper and lower end of right fibula, initial encounter for closed fracture: Secondary | ICD-10-CM | POA: Diagnosis not present

## 2020-11-22 MED ORDER — OXYCODONE-ACETAMINOPHEN 5-325 MG PO TABS
1.0000 | ORAL_TABLET | Freq: Once | ORAL | Status: AC
Start: 2020-11-22 — End: 2020-11-22
  Administered 2020-11-22: 1 via ORAL
  Filled 2020-11-22: qty 1

## 2020-11-22 NOTE — ED Notes (Signed)
Procedural consent signed. Dr. Billy Fischer, Janeece Fitting, [place], Silverio Decamp, [place], Amy Ray RT, Chestine Spore RN, Ardeen Fillers NT, all at bedside. Suction set up in the room, ambubag in room. IV works well with NS infusing to remain patency.

## 2020-11-22 NOTE — ED Notes (Signed)
Patient alert and able to respond to questions and move all 4 extremities. Dr. Billy Fischer at bedside.

## 2020-11-22 NOTE — ED Notes (Signed)
Dr. Billy Fischer pushed 60mg  IV Propofol. The rest of the medication 140mg  wasted in supply room.

## 2020-11-22 NOTE — ED Notes (Signed)
Time out at 23:27.

## 2020-11-22 NOTE — ED Notes (Signed)
Splint applied on patients right ankle/leg.

## 2020-11-23 ENCOUNTER — Other Ambulatory Visit: Payer: Self-pay

## 2020-11-23 ENCOUNTER — Other Ambulatory Visit (HOSPITAL_COMMUNITY)
Admission: RE | Admit: 2020-11-23 | Discharge: 2020-11-23 | Disposition: A | Discharge status: Home / Self Care | Payer: 59 | Source: Ambulatory Visit | Attending: Orthopedic Surgery | Admitting: Orthopedic Surgery

## 2020-11-23 ENCOUNTER — Encounter (HOSPITAL_BASED_OUTPATIENT_CLINIC_OR_DEPARTMENT_OTHER): Payer: Self-pay | Admitting: Orthopedic Surgery

## 2020-11-23 DIAGNOSIS — Z20822 Contact with and (suspected) exposure to covid-19: Secondary | ICD-10-CM | POA: Insufficient documentation

## 2020-11-23 DIAGNOSIS — Z01812 Encounter for preprocedural laboratory examination: Secondary | ICD-10-CM | POA: Insufficient documentation

## 2020-11-23 DIAGNOSIS — M25571 Pain in right ankle and joints of right foot: Secondary | ICD-10-CM | POA: Diagnosis not present

## 2020-11-23 MED ORDER — CHLORHEXIDINE GLUCONATE 4 % EX LIQD: 60.0000 mL | Freq: Once | CUTANEOUS | Status: DC

## 2020-11-23 MED ORDER — TRANEXAMIC ACID-NACL 1000-0.7 MG/100ML-% IV SOLN
1000.0000 mg | INTRAVENOUS | Status: AC
  Administered 2020-11-24: 1000 mg via INTRAVENOUS

## 2020-11-23 MED ORDER — DEXAMETHASONE SODIUM PHOSPHATE 10 MG/ML IJ SOLN: 8.0000 mg | Freq: Once | INTRAMUSCULAR | Status: DC

## 2020-11-23 MED ORDER — CEFAZOLIN SODIUM-DEXTROSE 2-4 GM/100ML-% IV SOLN
2.0000 g | INTRAVENOUS | Status: AC
  Administered 2020-11-24: 2 g via INTRAVENOUS

## 2020-11-23 MED ORDER — POVIDONE-IODINE 10 % EX SWAB: 2.0000 "application " | Freq: Once | CUTANEOUS | Status: DC

## 2020-11-23 MED ORDER — ACETAMINOPHEN 500 MG PO TABS
1000.0000 mg | ORAL_TABLET | Freq: Once | ORAL | Status: AC
  Administered 2020-11-24: 1000 mg via ORAL

## 2020-11-23 NOTE — Progress Notes (Signed)
Spoke w/ via phone for pre-op interview--- PT Lab needs dos----  Istat and EKG             Lab results------ no COVID test ------ done 11-23-2020 result in epic Arrive at ------- 0730 on 11-24-2020 NPO after MN NO Solid Food.  Clear liquids from MN until--- 0630 Med rec completed Medications to take morning of surgery ----- Wellbutrin, Prozac, Protonix Diabetic medication ----- n/a Patient instructed to bring photo id and insurance card day of surgery Patient aware to have Driver (ride ) / caregiver    for 24 hours after surgery-- son, Dameisha Tschida  Patient Special Instructions ----- n/a Pre-Op special Istructions ----- case just added on today for tomorrow, pre-op orders pending Patient verbalized understanding of instructions that were given at this phone interview. Patient denies shortness of breath, chest pain, fever, cough at this phone interview.

## 2020-11-23 NOTE — H&P (Signed)
PREOPERATIVE H&P  Chief Complaint: RIGHT ANKLE FRACTURE  HPI: Sheryl Cooper is a 60 y.o. female who presents with a diagnosis of RIGHT ANKLE FRACTURE. She stepped into a sink hole in [place] while boarding a cruise ship on 11/14/20 and twisted both ankles. She had immediate pain, swelling, bruising, and inability to bear weight on the right leg. Xrays on the cruise ship and in the ER upon returning home show a right ankle trimalleolar fracture and a left ankle sprain. Symptoms are rated as moderate to severe, and have been worsening.  This is significantly impairing activities of daily living.  She has elected for surgical management.   Past Medical History:  Diagnosis Date  . Abdominal distension FEW YRS AGO  . Abdominal pain, left lower quadrant 02/13/2015  . Anxiety   . Arthritis   . Chest pain FEW YRS AGO   WAS A PANIC ATTACK  . Conjunctivitis   . Episode of syncope 09/27/2010   Hx of syncope, with witnessed seizure-like activity lasting 20 minutes, and possible postictal state on 09/22/10. Evaluated in ED - Normal exam, CT head, CE, CMP, UDS, ETOH, d-dimer, UA. CBC with Hgb 10.6 (stable). EKG with prolonged QT. Marland Kitchen Because episode was possibly a seizure, guidelines state that patient should not drive for 3 months or until seen by neurology   . GERD (gastroesophageal reflux disease)   . Hypertension   . Insomnia   . Obesity, unspecified   . PANIC ATTACK   . Restless leg syndrome    Past Surgical History:  Procedure Laterality Date  . COLONOSCOPY  07/2013   WITH POLYP REMOVAL  . ENDOMETRIAL ABLATION  YRS AGO  . LAPAROSCOPIC GASTRIC SLEEVE RESECTION N/A 06/25/2018   Procedure: LAPAROSCOPIC GASTRIC SLEEVE RESECTION, UPPER ENDO, ERAS PATHWAY;  Surgeon: Excell Seltzer, MD;  Location: [facility];  Service: General;  Laterality: N/A;  . POLYPECTOMY    . TONSILLECTOMY  1976   Social History   Socioeconomic History  . Marital status: Legally Separated    Spouse name: Not on file   . Number of children: Not on file  . Years of education: Not on file  . Highest education level: Not on file  Occupational History  . Not on file  Tobacco Use  . Smoking status: Former Smoker    Packs/day: 0.50    Years: 25.00    Pack years: 12.50    Types: Cigarettes    Quit date: 09/24/1993    Years since quitting: 27.1  . Smokeless tobacco: Never Used  Vaping Use  . Vaping Use: Never used  Substance and Sexual Activity  . Alcohol use: Yes    Comment: OCC  . Drug use: No  . Sexual activity: Yes    Birth control/protection: Post-menopausal  Other Topics Concern  . Not on file  Social History Narrative  . Not on file   Social Determinants of Health   Financial Resource Strain: Not on file  Food Insecurity: Not on file  Transportation Needs: Not on file  Physical Activity: Not on file  Stress: Not on file  Social Connections: Not on file   Family History  Problem Relation Age of Onset  . Diabetes Mother   . Heart disease Mother   . Hypertension Mother   . Cancer Mother        breast (74), cervical (78), skin (58)  . Colon cancer Neg Hx   . Esophageal cancer Neg Hx   . Stomach cancer Neg Hx   .  Rectal cancer Neg Hx   . Colon polyps Neg Hx    Allergies  Allergen Reactions  . Diflucan [Fluconazole] Itching  . Sulfa Antibiotics Hives   Prior to Admission medications   Medication Sig Start Date End Date Taking? Authorizing Provider  aspirin 81 MG EC tablet TAKE 1 TABLET BY MOUTH DAILY (SWALLOW WHOLE) 05/11/20 05/11/21  Dickie La, MD  buPROPion [facility] SR) 150 MG 12 hr tablet TAKE 1 TABLET BY MOUTH TWICE DAILY 09/25/18   Dickie La, MD  buPROPion [facility] SR) 150 MG 12 hr tablet TAKE 2 TABLETS BY MOUTH EVERY MORNING. Patient not taking: Reported on 11/12/2020 05/11/20 05/11/21  Dickie La, MD  cephALEXin (KEFLEX) 500 MG capsule TAKE 1 CAPSULE (500 MG TOTAL) BY MOUTH 2 (TWO) TIMES DAILY FOR 5 DAYS. Patient not taking: Reported on 11/12/2020 06/30/20  06/30/21  Marcello Fennel, PA-C  cyclobenzaprine (FLEXERIL) 10 MG tablet Take 1 tablet (10 mg total) by mouth 3 (three) times daily as needed for muscle spasms. Patient not taking: Reported on 11/12/2020 07/02/20   Marrian Salvage, FNP  FLUoxetine (PROZAC) 20 MG capsule TAKE 3 CAPSULES BY MOUTH ONCE DAILY 03/19/20 03/19/21  Dickie La, MD  fluticasone [facility]) 50 MCG/ACT nasal spray Place 2 sprays into both nostrils daily. Patient not taking: Reported on 11/12/2020 05/04/18   Dickie La, MD  hydrochlorothiazide (HYDRODIURIL) 25 MG tablet TAKE 1 TABLET BY MOUTH ONCE DAILY 11/03/20 11/03/21  Dickie La, MD  hydrOXYzine (ATARAX/VISTARIL) 10 MG tablet TAKE 1 TABLET BY MOUTH 3 TIMES DAILY AS NEEDED Patient not taking: Reported on 11/12/2020 06/11/20 06/11/21  Dickie La, MD  influenza vac split quadrivalent PF (FLUARIX) 0.5 ML injection TO BE ADMINISTERED BY PHARMACIST 06/09/20 06/09/21  Carlyle Basques, MD  losartan (COZAAR) 50 MG tablet TAKE 1 TABLET BY MOUTH ONCE DAILY Patient not taking: Reported on 11/12/2020 11/03/20 11/03/21  Dickie La, MD  methylphenidate (RITALIN) 20 MG tablet TAKE 3 TABLETS BY MOUTH IN THE MORNING Patient not taking: Reported on 11/12/2020 07/28/20 01/24/21  Dickie La, MD  Multiple Vitamins-Minerals (MULTIVITAL PO) Take by mouth.    [provider]  naproxen (NAPROSYN) 500 MG tablet Take 1 tablet (500 mg total) by mouth 2 (two) times daily with a meal. Patient not taking: Reported on 11/12/2020 07/02/20   Marrian Salvage, FNP  ondansetron (ZOFRAN) 4 MG tablet Take 1 tablet (4 mg total) by mouth every 8 (eight) hours as needed for nausea or vomiting. Patient not taking: Reported on 11/12/2020 11/03/20   Dickie La, MD  oxyCODONE (ROXICODONE) 5 MG immediate release tablet Take 1 tablet (5 mg total) by mouth every 4 (four) hours as needed for severe pain. 11/21/20   Sponseller, Eugene Garnet R, PA-C  pantoprazole (PROTONIX) 40 MG tablet TAKE 1 TABLET BY MOUTH ONCE  DAILY 06/09/20 06/09/21  Dickie La, MD  rOPINIRole (REQUIP) 1 MG tablet TAKE 1 TABLET (1 MG TOTAL) BY MOUTH AT BEDTIME. 11/03/20 11/03/21  Dickie La, MD  scopolamine (TRANSDERM-SCOP, 1.5 MG,) 1 MG/3DAYS Place 1 patch (1.5 mg total) onto the skin every 3 (three) days. Patient not taking: Reported on 11/12/2020 11/03/20   Dickie La, MD     Positive ROS: All other systems have been reviewed and were otherwise negative with the exception of those mentioned in the HPI and as above.  Physical Exam: General: Alert, no acute distress Cardiovascular: No pedal edema Respiratory: No cyanosis, no use of accessory  musculature GI: No organomegaly, abdomen is soft and non-tender Skin: No lesions in the area of chief complaint Neurologic: Sensation intact distally Psychiatric: Patient is competent for consent with normal mood and affect Lymphatic: No axillary or cervical lymphadenopathy  MUSCULOSKELETAL:  left ankle edema, ecchymosis, TTP lateral malleolus and anterior ankle, limited ankle ROM Right ankle in splint, can wiggle toes, brisk cap refill  Assessment: RIGHT ANKLE FRACTURE  Plan: Plan for Procedure(s): OPEN REDUCTION INTERNAL FIXATION (ORIF) ANKLE FRACTURE  The risks benefits and alternatives were discussed with the patient including but not limited to the risks of nonoperative treatment, versus surgical intervention including infection, bleeding, nerve injury,  blood clots, cardiopulmonary complications, morbidity, mortality, among others, and they were willing to proceed.   After surgery Weightbearing: NWM RLE, WBAT LLE Orthopedic devices: CAM boot left ankle Showering: POD 3, keep splint dry Dressing: splint right ankle Medicines: ASA, Oxycodone, Tylenol, Mobic, Robaxin, Zofran, Omeprazole  Discharge: home Follow up: 7-10 days in the office   Alisa Graff Office [phone] 11/23/2020 12:16 PM

## 2020-11-24 ENCOUNTER — Other Ambulatory Visit: Payer: Self-pay

## 2020-11-24 ENCOUNTER — Other Ambulatory Visit (HOSPITAL_COMMUNITY): Payer: Self-pay

## 2020-11-24 ENCOUNTER — Ambulatory Visit (HOSPITAL_BASED_OUTPATIENT_CLINIC_OR_DEPARTMENT_OTHER): Payer: 59 | Admitting: Anesthesiology

## 2020-11-24 ENCOUNTER — Ambulatory Visit (HOSPITAL_BASED_OUTPATIENT_CLINIC_OR_DEPARTMENT_OTHER)
Admission: RE | Admit: 2020-11-24 | Discharge: 2020-11-24 | Disposition: A | Discharge status: Home / Self Care | Payer: 59 | Attending: Orthopedic Surgery | Admitting: Orthopedic Surgery

## 2020-11-24 ENCOUNTER — Encounter (HOSPITAL_BASED_OUTPATIENT_CLINIC_OR_DEPARTMENT_OTHER): Payer: Self-pay | Admitting: Orthopedic Surgery

## 2020-11-24 ENCOUNTER — Encounter (HOSPITAL_BASED_OUTPATIENT_CLINIC_OR_DEPARTMENT_OTHER)
Admission: RE | Disposition: A | Discharge status: Home / Self Care | Payer: Self-pay | Source: Home / Self Care | Attending: Orthopedic Surgery

## 2020-11-24 DIAGNOSIS — S82851A Displaced trimalleolar fracture of right lower leg, initial encounter for closed fracture: Secondary | ICD-10-CM | POA: Diagnosis not present

## 2020-11-24 DIAGNOSIS — Z7982 Long term (current) use of aspirin: Secondary | ICD-10-CM | POA: Insufficient documentation

## 2020-11-24 DIAGNOSIS — Z791 Long term (current) use of non-steroidal anti-inflammatories (NSAID): Secondary | ICD-10-CM | POA: Insufficient documentation

## 2020-11-24 DIAGNOSIS — K219 Gastro-esophageal reflux disease without esophagitis: Secondary | ICD-10-CM | POA: Diagnosis not present

## 2020-11-24 DIAGNOSIS — Z833 Family history of diabetes mellitus: Secondary | ICD-10-CM | POA: Diagnosis not present

## 2020-11-24 DIAGNOSIS — Y9389 Activity, other specified: Secondary | ICD-10-CM | POA: Diagnosis not present

## 2020-11-24 DIAGNOSIS — Z883 Allergy status to other anti-infective agents status: Secondary | ICD-10-CM | POA: Insufficient documentation

## 2020-11-24 DIAGNOSIS — Z8249 Family history of ischemic heart disease and other diseases of the circulatory system: Secondary | ICD-10-CM | POA: Diagnosis not present

## 2020-11-24 DIAGNOSIS — X501XXA Overexertion from prolonged static or awkward postures, initial encounter: Secondary | ICD-10-CM | POA: Insufficient documentation

## 2020-11-24 DIAGNOSIS — S93431A Sprain of tibiofibular ligament of right ankle, initial encounter: Secondary | ICD-10-CM | POA: Insufficient documentation

## 2020-11-24 DIAGNOSIS — Z882 Allergy status to sulfonamides status: Secondary | ICD-10-CM | POA: Diagnosis not present

## 2020-11-24 DIAGNOSIS — S82891A Other fracture of right lower leg, initial encounter for closed fracture: Secondary | ICD-10-CM | POA: Diagnosis not present

## 2020-11-24 DIAGNOSIS — I10 Essential (primary) hypertension: Secondary | ICD-10-CM | POA: Diagnosis not present

## 2020-11-24 DIAGNOSIS — S82892A Other fracture of left lower leg, initial encounter for closed fracture: Secondary | ICD-10-CM | POA: Diagnosis not present

## 2020-11-24 DIAGNOSIS — Z8049 Family history of malignant neoplasm of other genital organs: Secondary | ICD-10-CM | POA: Diagnosis not present

## 2020-11-24 DIAGNOSIS — Z87891 Personal history of nicotine dependence: Secondary | ICD-10-CM | POA: Diagnosis not present

## 2020-11-24 DIAGNOSIS — G8918 Other acute postprocedural pain: Secondary | ICD-10-CM | POA: Diagnosis not present

## 2020-11-24 DIAGNOSIS — Z803 Family history of malignant neoplasm of breast: Secondary | ICD-10-CM | POA: Diagnosis not present

## 2020-11-24 DIAGNOSIS — G4700 Insomnia, unspecified: Secondary | ICD-10-CM | POA: Diagnosis not present

## 2020-11-24 HISTORY — DX: Migraine, unspecified, not intractable, without status migrainosus: G43.909

## 2020-11-24 HISTORY — DX: Personal history of other mental and behavioral disorders: Z86.59

## 2020-11-24 HISTORY — DX: Depression, unspecified: F32.A

## 2020-11-24 HISTORY — DX: Personal history of other specified conditions: Z87.898

## 2020-11-24 HISTORY — DX: Unspecified fracture of left foot, initial encounter for closed fracture: S92.902A

## 2020-11-24 HISTORY — PX: ORIF ANKLE FRACTURE: SHX5408

## 2020-11-24 HISTORY — DX: Other fracture of right lower leg, initial encounter for closed fracture: S82.891A

## 2020-11-24 HISTORY — DX: Presence of spectacles and contact lenses: Z97.3

## 2020-11-24 LAB — POCT I-STAT, CHEM 8
BUN: 18 mg/dL (ref 8–23)
Calcium, Ion: 1.03 mmol/L — ABNORMAL LOW (ref 1.15–1.40)
Chloride: 106 mmol/L (ref 98–111)
Creatinine, Ser: 0.9 mg/dL (ref 0.44–1.00)
Glucose, Bld: 84 mg/dL (ref 70–99)
HCT: 30 % — ABNORMAL LOW (ref 36.0–46.0)
Hemoglobin: 10.2 g/dL — ABNORMAL LOW (ref 12.0–15.0)
Potassium: 3.8 mmol/L (ref 3.5–5.1)
Sodium: 141 mmol/L (ref 135–145)
TCO2: 24 mmol/L (ref 22–32)

## 2020-11-24 LAB — SARS CORONAVIRUS 2 (TAT 6-24 HRS): SARS Coronavirus 2: NEGATIVE

## 2020-11-24 SURGERY — OPEN REDUCTION INTERNAL FIXATION (ORIF) ANKLE FRACTURE
Anesthesia: Regional | Site: Ankle | Laterality: Right

## 2020-11-24 MED ORDER — LIDOCAINE 2% (20 MG/ML) 5 ML SYRINGE
INTRAMUSCULAR | Status: AC
  Filled 2020-11-24: qty 5

## 2020-11-24 MED ORDER — OXYCODONE HCL 5 MG PO TABS
5.0000 mg | ORAL_TABLET | Freq: Four times a day (QID) | ORAL | 0 refills | Status: DC | PRN
  Filled 2020-11-24: qty 28, 7d supply, fill #0

## 2020-11-24 MED ORDER — MIDAZOLAM HCL 2 MG/2ML IJ SOLN
INTRAMUSCULAR | Status: AC
  Filled 2020-11-24: qty 2

## 2020-11-24 MED ORDER — PHENYLEPHRINE 40 MCG/ML (10ML) SYRINGE FOR IV PUSH (FOR BLOOD PRESSURE SUPPORT)
PREFILLED_SYRINGE | INTRAVENOUS | Status: AC
  Filled 2020-11-24: qty 10

## 2020-11-24 MED ORDER — FENTANYL CITRATE (PF) 100 MCG/2ML IJ SOLN
100.0000 ug | Freq: Once | INTRAMUSCULAR | Status: AC
  Administered 2020-11-24: 100 ug via INTRAVENOUS

## 2020-11-24 MED ORDER — ROPIVACAINE HCL 5 MG/ML IJ SOLN
INTRAMUSCULAR | Status: DC | PRN
  Administered 2020-11-24: 40 mL via PERINEURAL

## 2020-11-24 MED ORDER — LIDOCAINE HCL (CARDIAC) PF 100 MG/5ML IV SOSY
PREFILLED_SYRINGE | INTRAVENOUS | Status: DC | PRN
  Administered 2020-11-24: 60 mg via INTRAVENOUS

## 2020-11-24 MED ORDER — FENTANYL CITRATE (PF) 100 MCG/2ML IJ SOLN
INTRAMUSCULAR | Status: AC
  Filled 2020-11-24: qty 2

## 2020-11-24 MED ORDER — PROPOFOL 10 MG/ML IV BOLUS
INTRAVENOUS | Status: AC
  Filled 2020-11-24: qty 20

## 2020-11-24 MED ORDER — ONDANSETRON HCL 4 MG PO TABS
4.0000 mg | ORAL_TABLET | Freq: Every day | ORAL | 0 refills | Status: DC | PRN
  Filled 2020-11-24: qty 10, 10d supply, fill #0

## 2020-11-24 MED ORDER — OXYCODONE HCL 5 MG/5ML PO SOLN: 5.0000 mg | Freq: Once | ORAL | Status: DC | PRN

## 2020-11-24 MED ORDER — PROMETHAZINE HCL 25 MG/ML IJ SOLN: 6.2500 mg | INTRAMUSCULAR | Status: DC | PRN

## 2020-11-24 MED ORDER — FENTANYL CITRATE (PF) 100 MCG/2ML IJ SOLN
INTRAMUSCULAR | Status: DC | PRN
  Administered 2020-11-24 (×4): 25 ug via INTRAVENOUS

## 2020-11-24 MED ORDER — ACETAMINOPHEN 500 MG PO TABS
1000.0000 mg | ORAL_TABLET | Freq: Four times a day (QID) | ORAL | 0 refills | Status: DC | PRN
  Filled 2020-11-24 – 2021-01-16 (×4): qty 80, 10d supply, fill #0

## 2020-11-24 MED ORDER — ACETAMINOPHEN 500 MG PO TABS: 1000.0000 mg | ORAL_TABLET | Freq: Once | ORAL | Status: DC

## 2020-11-24 MED ORDER — MIDAZOLAM HCL 5 MG/5ML IJ SOLN
INTRAMUSCULAR | Status: DC | PRN
  Administered 2020-11-24: 2 mg via INTRAVENOUS

## 2020-11-24 MED ORDER — PROPOFOL 10 MG/ML IV BOLUS
INTRAVENOUS | Status: DC | PRN
  Administered 2020-11-24: 50 mg via INTRAVENOUS
  Administered 2020-11-24: 200 mg via INTRAVENOUS

## 2020-11-24 MED ORDER — KETOROLAC TROMETHAMINE 30 MG/ML IJ SOLN: 30.0000 mg | Freq: Once | INTRAMUSCULAR | Status: DC | PRN

## 2020-11-24 MED ORDER — ACETAMINOPHEN 500 MG PO TABS
ORAL_TABLET | ORAL | Status: AC
  Filled 2020-11-24: qty 2

## 2020-11-24 MED ORDER — OXYCODONE HCL 5 MG PO TABS
5.0000 mg | ORAL_TABLET | Freq: Once | ORAL | Status: DC | PRN
Start: 2020-11-24 — End: 2020-11-24

## 2020-11-24 MED ORDER — MIDAZOLAM HCL 2 MG/2ML IJ SOLN
2.0000 mg | Freq: Once | INTRAMUSCULAR | Status: AC
  Administered 2020-11-24: 2 mg via INTRAVENOUS

## 2020-11-24 MED ORDER — CEFAZOLIN SODIUM-DEXTROSE 2-4 GM/100ML-% IV SOLN
INTRAVENOUS | Status: AC
  Filled 2020-11-24: qty 100

## 2020-11-24 MED ORDER — ASPIRIN 81 MG PO TBEC
81.0000 mg | DELAYED_RELEASE_TABLET | Freq: Two times a day (BID) | ORAL | 0 refills | Status: DC
  Filled 2020-11-24: qty 60, 30d supply, fill #0

## 2020-11-24 MED ORDER — DEXAMETHASONE SODIUM PHOSPHATE 4 MG/ML IJ SOLN
INTRAMUSCULAR | Status: DC | PRN
  Administered 2020-11-24: 5 mg via INTRAVENOUS

## 2020-11-24 MED ORDER — PHENYLEPHRINE HCL (PRESSORS) 10 MG/ML IV SOLN
INTRAVENOUS | Status: DC | PRN
  Administered 2020-11-24 (×8): 80 ug via INTRAVENOUS

## 2020-11-24 MED ORDER — TRANEXAMIC ACID-NACL 1000-0.7 MG/100ML-% IV SOLN
INTRAVENOUS | Status: AC
  Filled 2020-11-24: qty 100

## 2020-11-24 MED ORDER — ONDANSETRON HCL 4 MG/2ML IJ SOLN
INTRAMUSCULAR | Status: DC | PRN
  Administered 2020-11-24: 4 mg via INTRAVENOUS

## 2020-11-24 MED ORDER — LACTATED RINGERS IV SOLN: INTRAVENOUS | Status: DC

## 2020-11-24 MED ORDER — ONDANSETRON HCL 4 MG/2ML IJ SOLN
INTRAMUSCULAR | Status: AC
  Filled 2020-11-24: qty 2

## 2020-11-24 MED ORDER — DEXAMETHASONE SODIUM PHOSPHATE 10 MG/ML IJ SOLN
INTRAMUSCULAR | Status: AC
  Filled 2020-11-24: qty 1

## 2020-11-24 MED ORDER — METHOCARBAMOL 500 MG PO TABS
500.0000 mg | ORAL_TABLET | Freq: Three times a day (TID) | ORAL | 0 refills | Status: DC | PRN
  Filled 2020-11-24: qty 20, 7d supply, fill #0

## 2020-11-24 MED ORDER — HYDROMORPHONE HCL 1 MG/ML IJ SOLN: 0.2500 mg | INTRAMUSCULAR | Status: DC | PRN

## 2020-11-24 MED ORDER — DEXAMETHASONE SODIUM PHOSPHATE 10 MG/ML IJ SOLN
INTRAMUSCULAR | Status: DC | PRN
  Administered 2020-11-24: 10 mg

## 2020-11-24 SURGICAL SUPPLY — 84 items
APL PRP STRL LF DISP 70% ISPRP (MISCELLANEOUS) ×1
BANDAGE ESMARK 6X9 LF (GAUZE/BANDAGES/DRESSINGS) ×1 IMPLANT
BIT DRILL 3.5X122MM AO FIT (BIT) ×2 IMPLANT
BIT DRILL CANN 2.7 (BIT) ×2
BIT DRILL SRG 2.7XCANN AO CPLG (BIT) ×1 IMPLANT
BIT DRL SRG 2.7XCANN AO CPLNG (BIT) ×1
BLADE SURG 15 STRL LF DISP TIS (BLADE) ×2 IMPLANT
BLADE SURG 15 STRL SS (BLADE) ×4
BNDG CMPR 9X6 STRL LF SNTH (GAUZE/BANDAGES/DRESSINGS) ×1
BNDG COHESIVE 3X5 TAN STRL LF (GAUZE/BANDAGES/DRESSINGS) ×2 IMPLANT
BNDG COHESIVE 4X5 TAN STRL (GAUZE/BANDAGES/DRESSINGS) ×2 IMPLANT
BNDG ELASTIC 4X5.8 VLCR STR LF (GAUZE/BANDAGES/DRESSINGS) ×2 IMPLANT
BNDG ELASTIC 6X5.8 VLCR STR LF (GAUZE/BANDAGES/DRESSINGS) ×2 IMPLANT
BNDG ESMARK 6X9 LF (GAUZE/BANDAGES/DRESSINGS) ×2
BNDG GAUZE ELAST 4 BULKY (GAUZE/BANDAGES/DRESSINGS) IMPLANT
CHLORAPREP W/TINT 26 (MISCELLANEOUS) ×2 IMPLANT
CLSR STERI-STRIP ANTIMIC 1/2X4 (GAUZE/BANDAGES/DRESSINGS) ×2 IMPLANT
COVER BACK TABLE 60X90IN (DRAPES) ×2 IMPLANT
COVER MAYO STAND STRL (DRAPES) ×2 IMPLANT
COVER WAND RF STERILE (DRAPES) ×2 IMPLANT
CUFF TOURN SGL QUICK 24 (TOURNIQUET CUFF)
CUFF TOURN SGL QUICK 34 (TOURNIQUET CUFF) ×2
CUFF TRNQT CYL 24X4X16.5-23 (TOURNIQUET CUFF) IMPLANT
CUFF TRNQT CYL 34X4.125X (TOURNIQUET CUFF) ×1 IMPLANT
DECANTER SPIKE VIAL GLASS SM (MISCELLANEOUS) IMPLANT
DRAPE C-ARM 35X43 STRL (DRAPES) ×2 IMPLANT
DRAPE EXTREMITY T 121X128X90 (DISPOSABLE) ×2 IMPLANT
DRAPE IMP U-DRAPE 54X76 (DRAPES) ×2 IMPLANT
DRAPE U-SHAPE 47X51 STRL (DRAPES) ×2 IMPLANT
DRILL 2.6X122MM WL AO SHAFT (BIT) ×2 IMPLANT
DRSG EMULSION OIL 3X3 NADH (GAUZE/BANDAGES/DRESSINGS) IMPLANT
DRSG PAD ABDOMINAL 8X10 ST (GAUZE/BANDAGES/DRESSINGS) ×6 IMPLANT
ELECT REM PT RETURN 9FT ADLT (ELECTROSURGICAL) ×2
ELECTRODE REM PT RTRN 9FT ADLT (ELECTROSURGICAL) ×1 IMPLANT
GAUZE SPONGE 4X4 12PLY STRL (GAUZE/BANDAGES/DRESSINGS) ×2 IMPLANT
GAUZE SPONGE 4X4 12PLY STRL LF (GAUZE/BANDAGES/DRESSINGS) ×2 IMPLANT
GLOVE SRG 8 PF TXTR STRL LF DI (GLOVE) ×2 IMPLANT
GLOVE SURG ENC MOIS LTX SZ7.5 (GLOVE) ×4 IMPLANT
GLOVE SURG UNDER POLY LF SZ8 (GLOVE) ×4
GOWN STRL REUS W/TWL LRG LVL3 (GOWN DISPOSABLE) ×6 IMPLANT
IMPL TIGHTROP W/DRV K-LESS (Anchor) ×1 IMPLANT
IMPLANT TIGHTROPE W/DRV K-LESS (Anchor) ×2 IMPLANT
K-WIRE ORTHOPEDIC 1.4X150L (WIRE) ×4
KIT TURNOVER CYSTO (KITS) ×2 IMPLANT
KWIRE ORTHOPEDIC 1.4X150L (WIRE) ×2 IMPLANT
NEEDLE HYPO 22GX1.5 SAFETY (NEEDLE) IMPLANT
NS IRRIG 1000ML POUR BTL (IV SOLUTION) ×2 IMPLANT
PACK BASIN DAY SURGERY FS (CUSTOM PROCEDURE TRAY) ×2 IMPLANT
PAD CAST 4YDX4 CTTN HI CHSV (CAST SUPPLIES) ×1 IMPLANT
PADDING CAST ABS 4INX4YD NS (CAST SUPPLIES) ×2
PADDING CAST ABS COTTON 4X4 ST (CAST SUPPLIES) ×2 IMPLANT
PADDING CAST COTTON 4X4 STRL (CAST SUPPLIES) ×2
PADDING CAST COTTON 6X4 STRL (CAST SUPPLIES) ×4 IMPLANT
PENCIL SMOKE EVACUATOR (MISCELLANEOUS) ×2 IMPLANT
PLATE 1/3 TUBULAR 7H (Plate) ×2 IMPLANT
SCREW CANC 2.5XFT HEX12X4X (Screw) ×2 IMPLANT
SCREW CANCELLOUS 4.0X12MM (Screw) ×4 IMPLANT
SCREW CANCELLOUS 4.0X14 (Screw) ×2 IMPLANT
SCREW CANN ASNIS III 4.0X40MM (Screw) ×2 IMPLANT
SCREW CORTEX ST MATTA 3.5X12MM (Screw) ×6 IMPLANT
SCREW CORTEX ST MATTA 3.5X18MM (Screw) ×2 IMPLANT
SCREW CORTEX ST MATTA 3.5X24 (Screw) ×2 IMPLANT
SPLINT FAST PLASTER 5X30 (CAST SUPPLIES) ×20
SPLINT PLASTER CAST FAST 5X30 (CAST SUPPLIES) ×20 IMPLANT
SPONGE LAP 4X18 RFD (DISPOSABLE) ×2 IMPLANT
STOCKINETTE 6  STRL (DRAPES) ×2
STOCKINETTE 6 STRL (DRAPES) ×1 IMPLANT
STRIP CLOSURE SKIN 1/2X4 (GAUZE/BANDAGES/DRESSINGS) ×2 IMPLANT
SUCTION FRAZIER HANDLE 10FR (MISCELLANEOUS) ×4
SUCTION TUBE FRAZIER 10FR DISP (MISCELLANEOUS) ×2 IMPLANT
SUT ETHILON 3 0 PS 1 (SUTURE) IMPLANT
SUT MNCRL AB 4-0 PS2 18 (SUTURE) ×4 IMPLANT
SUT MON AB 2-0 CT1 36 (SUTURE) IMPLANT
SUT MON AB 3-0 SH 27 (SUTURE)
SUT MON AB 3-0 SH27 (SUTURE) IMPLANT
SUT VIC AB 0 CT1 36 (SUTURE) ×2 IMPLANT
SUT VIC AB 2-0 PS2 27 (SUTURE) ×2 IMPLANT
SUT VIC AB 2-0 SH 27 (SUTURE)
SUT VIC AB 2-0 SH 27XBRD (SUTURE) IMPLANT
SYR BULB EAR ULCER 3OZ GRN STR (SYRINGE) ×2 IMPLANT
SYR CONTROL 10ML LL (SYRINGE) IMPLANT
TOWEL OR 17X26 10 PK STRL BLUE (TOWEL DISPOSABLE) ×2 IMPLANT
TUBE CONNECTING 12X1/4 (SUCTIONS) ×4 IMPLANT
UNDERPAD 30X36 HEAVY ABSORB (UNDERPADS AND DIAPERS) ×2 IMPLANT

## 2020-11-24 NOTE — Interval H&P Note (Signed)
History and Physical Interval Note:  11/24/2020 7:19 AM  Sheryl Cooper  has presented today for surgery, with the diagnosis of RIGHT ANKLE FRACTURE.  The various methods of treatment have been discussed with the patient and family. After consideration of risks, benefits and other options for treatment, the patient has consented to  Procedure(s): OPEN REDUCTION INTERNAL FIXATION (ORIF) ANKLE FRACTURE (Right) as a surgical intervention.  The patient's history has been reviewed, patient examined, no change in status, stable for surgery.  I have reviewed the patient's chart and labs.  Questions were answered to the patient's satisfaction.     [name]

## 2020-11-24 NOTE — Anesthesia Postprocedure Evaluation (Signed)
Anesthesia Post Note  Patient: Sheryl Cooper  Procedure(s) Performed: OPEN REDUCTION INTERNAL FIXATION (ORIF) ANKLE FRACTURE (Right Ankle)     Patient location during evaluation: PACU Anesthesia Type: Regional and General Level of consciousness: awake and alert, oriented and patient cooperative Pain management: pain level controlled Vital Signs Assessment: post-procedure vital signs reviewed and stable Respiratory status: spontaneous breathing, nonlabored ventilation and respiratory function stable Cardiovascular status: blood pressure returned to baseline and stable Postop Assessment: no apparent nausea or vomiting Anesthetic complications: no   No complications documented.  Last Vitals:  Vitals:   11/24/20 0850 11/24/20 1045  BP:  (!) 112/41  Pulse: 69 74  Resp: 14 11  Temp:  36.8 C  SpO2: 96% 95%    Last Pain:  Vitals:   11/24/20 1045  TempSrc:   PainSc: 0-No pain                 Pervis Hocking

## 2020-11-24 NOTE — Anesthesia Procedure Notes (Signed)
Anesthesia Regional Block: Popliteal block   Pre-Anesthetic Checklist: ,, timeout performed, Correct Patient, Correct Site, Correct Laterality, Correct Procedure, Correct Position, site marked, Risks and benefits discussed,  Surgical consent,  Pre-op evaluation,  At surgeon's request and post-op pain management  Laterality: Right  Prep: Maximum Sterile Barrier Precautions used, chloraprep       Needles:  Injection technique: Single-shot  Needle Type: Echogenic Stimulator Needle     Needle Length: 9cm  Needle Gauge: 22     Additional Needles:   Procedures:,,,, ultrasound used (permanent image in chart),,,,  Narrative:  Start time: 11/24/2020 8:25 AM End time: 11/24/2020 8:30 AM Injection made incrementally with aspirations every 5 mL.  Performed by: Personally  Anesthesiologist: Pervis Hocking, DO  Additional Notes: Monitors applied. No increased pain on injection. No increased resistance to injection. Injection made in 5cc increments. Good needle visualization. Patient tolerated procedure well.

## 2020-11-24 NOTE — Interval H&P Note (Signed)
History and Physical Interval Note:  11/24/2020 8:49 AM  Sheryl Cooper  has presented today for surgery, with the diagnosis of RIGHT ANKLE FRACTURE.  The various methods of treatment have been discussed with the patient and family. After consideration of risks, benefits and other options for treatment, the patient has consented to  Procedure(s): OPEN REDUCTION INTERNAL FIXATION (ORIF) ANKLE FRACTURE (Right) as a surgical intervention.  The patient's history has been reviewed, patient examined, no change in status, stable for surgery.  I have reviewed the patient's chart and labs.  Questions were answered to the patient's satisfaction.     [name]

## 2020-11-24 NOTE — Anesthesia Procedure Notes (Signed)
Anesthesia Regional Block: Popliteal block   Pre-Anesthetic Checklist: ,, timeout performed, Correct Patient, Correct Site, Correct Laterality, Correct Procedure, Correct Position, site marked, Risks and benefits discussed,  Surgical consent,  Pre-op evaluation,  At surgeon's request and post-op pain management  Laterality: Right  Prep: Maximum Sterile Barrier Precautions used, chloraprep       Needles:  Injection technique: Single-shot  Needle Type: Echogenic Stimulator Needle     Needle Length: 9cm  Needle Gauge: 22     Additional Needles:   Procedures:,,,, ultrasound used (permanent image in chart),,,,  Narrative:  Start time: 11/24/2020 8:20 AM End time: 11/24/2020 8:25 AM Injection made incrementally with aspirations every 5 mL.  Performed by: Personally  Anesthesiologist: Pervis Hocking, DO  Additional Notes: Monitors applied. No increased pain on injection. No increased resistance to injection. Injection made in 5cc increments. Good needle visualization. Patient tolerated procedure well.

## 2020-11-24 NOTE — Progress Notes (Signed)
Assisted Dr. Doroteo Glassman with right, ultrasound guided, popliteal, adductor canal block. Side rails up, monitors on throughout procedure. See vital signs in flow sheet. Tolerated Procedure well.

## 2020-11-24 NOTE — Addendum Note (Signed)
Addendum  created 11/24/20 1321 by Justice Rocher, CRNA   Charge Capture section accepted

## 2020-11-24 NOTE — ED Provider Notes (Signed)
Physical Exam  BP 129/63 (BP Location: Left Arm)   Pulse 80   Temp 98.5 F (36.9 C) (Oral)   Resp 14   LMP 08/17/2012   SpO2 100%   Physical Exam  ED Course/Procedures   Clinical Course as of 11/24/20 1142  Sat Nov 21, 2020  2143 Consult to Ortho surgeon, Dr. Percell Miller, who recommends CT of the left foot for further evaluation of possible fracture of the left foot as well as reproduction of the right ankle with trimalleolar fracture.  He will plan to see the patient in his office first thing Monday morning at 830. I appreciate his collaboration in the care of this patient. Per ortho, no indication for admission. [RS]    Clinical Course User Index [RS] Sponseller, Rebekah R, PA-C    .Sedation  Date/Time: 11/24/2020 11:43 AM Performed by: Gareth Morgan, MD Authorized by: Gareth Morgan, MD   Consent:    Consent obtained:  Written   Consent given by:  Patient   Risks discussed:  Allergic reaction, dysrhythmia, inadequate sedation, nausea, prolonged hypoxia resulting in organ damage, prolonged sedation necessitating reversal, respiratory compromise necessitating ventilatory assistance and intubation and vomiting   Alternatives discussed:  Analgesia without sedation Universal protocol:    Immediately prior to procedure, a time out was called: yes   Indications:    Procedure performed:  Fracture reduction   Procedure necessitating sedation performed by:  Different physician (PA under my supervision) Pre-sedation assessment:    Time since last food or drink:  6   ASA classification: class 2 - patient with mild systemic disease     Mouth opening:  3 or more finger widths   Thyromental distance:  4 finger widths   Mallampati score:  I - soft palate, uvula, fauces, pillars visible   Neck mobility: normal     Pre-sedation assessments completed and reviewed: airway patency, cardiovascular function, hydration status, mental status, nausea/vomiting and pain level   Immediate  pre-procedure details:    Reassessment: Patient reassessed immediately prior to procedure     Reviewed: vital signs, relevant labs/tests and NPO status     Verified: bag valve mask available, emergency equipment available, intubation equipment available and IV patency confirmed   Procedure details (see MAR for exact dosages):    Preoxygenation:  Nasal cannula   Sedation:  Propofol   Intended level of sedation: deep   Intra-procedure monitoring:  Blood pressure monitoring, cardiac monitor, continuous capnometry, continuous pulse oximetry, frequent vital sign checks and frequent LOC assessments   Intra-procedure events: none     Total Provider sedation time (minutes):  10 Post-procedure details:    Post-sedation assessments completed and reviewed: airway patency, cardiovascular function, mental status and nausea/vomiting     Patient is stable for discharge or admission: yes     Procedure completion:  Tolerated well, no immediate complications .Splint Application  Date/Time: 11/24/2020 11:47 AM Performed by: Gareth Morgan, MD Authorized by: Gareth Morgan, MD   Consent:    Consent obtained:  Verbal   Consent given by:  Patient   Risks, benefits, and alternatives were discussed: yes     Risks discussed:  Discoloration, numbness and pain   Alternatives discussed:  No treatment Universal protocol:    Patient identity confirmed:  Arm band and verbally with patient Pre-procedure details:    Distal neurologic exam:  Normal   Distal perfusion: distal pulses strong   Procedure details:    Location:  Ankle   Ankle location:  R ankle  Splint type:  Short leg   Supplies:  Fiberglass and cotton padding   Attestation: Splint applied and adjusted personally by me   Post-procedure details:    Distal neurologic exam:  Normal   Distal perfusion: brisk capillary refill     Procedure completion:  Tolerated well, no immediate complications   Post-procedure imaging: reviewed      MDM   Shared visit. Please see other note for care.  Bimalleolar ankle fracture for 1 week, recommended to reduce fracture by Dr. Percell Miller and splint again. Sedation, reduction, splinting performed and pt to remain NWB and follow up with him on Monday.      Gareth Morgan, MD 11/24/20 1149

## 2020-11-24 NOTE — Discharge Instructions (Signed)
Displaced Trimalleolar Ankle Fracture Treated With ORIF, Care After This sheet gives you information about how to care for yourself after your procedure. Your health care provider may also give you more specific instructions. If you have problems or questions, contact your health care provider. What can I expect after the procedure? After the procedure, it is common to have:  Pain.  Swelling.  A small amount of fluid from your incision. Follow these instructions at home: If you have a splint or boot:  Wear the splint or boot as told by your health care provider. Remove it only as told by your health care provider.  Loosen the splint or boot if your toes tingle, become numb, or turn cold and blue.  Keep the splint or boot clean and dry. If you have a cast:  Do not stick anything inside the cast to scratch your skin. Doing that increases your risk of infection.  Check the skin around the cast every day. Tell your health care provider about any concerns.  You may put lotion on dry skin around the edges of the cast. Do not put lotion on the skin underneath the cast.  Keep the cast clean and dry. Bathing  Do not take baths, swim, or use a hot tub until your health care provider approves. Ask your health care provider if you can take showers.  If your splint, boot, or cast is not waterproof: ? Do not let it get wet. ? Cover it with a watertight covering when you take a bath or a shower.  Keep your bandage (dressing) dry until your health care provider says it can be removed. Incision care  Follow instructions from your health care provider about how to take care of your incision. Make sure you: ? Wash your hands with soap and water for at least 20 seconds before and after you change your dressing. If soap and water are not available, use hand sanitizer. ? Change your dressing as told by your health care provider. ? Leave stitches (sutures), skin glue, or adhesive strips in place.  These skin closures may need to stay in place for 2 weeks or longer. If adhesive strip edges start to loosen and curl up, you may trim the loose edges. Do not remove adhesive strips completely unless your health care provider tells you to do that.  Check your incision area every day for signs of infection. Check for: ? Redness. ? More pain or swelling. ? Blood or more fluid. ? Warmth. ? Pus or a bad smell.   Managing pain, stiffness, and swelling  If directed, put ice on the affected area. To do this: ? If you have a removable splint or boot, remove it as told by your health care provider. ? Put ice in a plastic bag. ? Place a towel between your skin and the bag or between your cast and the bag. ? Leave the ice on for 20 minutes, 2-3 times a day. ? Remove the ice if your skin turns bright red. This is very important. If you cannot feel pain, heat, or cold, you have a greater risk of damage to the area.  Move your toes often to reduce stiffness and swelling.  Raise (elevate) the injured area above the level of your heart while you are sitting or lying down. To do this, try putting a few pillows under your leg and ankle.   Activity  Do exercises as told by your health care provider or physical therapist.  Do  not use your injured limb to support (bear) your body weight until your health care provider says that you can. Follow weight-bearing restrictions as told. Use crutches or other devices to help you move around (assistive devices) as told by your health care provider.  Ask your health care provider when it is safe to drive if you have a splint, boot, or cast on your foot.  Return to your normal activities as told by your health care provider. Ask your health care provider what activities are safe for you. General instructions  Do not put pressure on any part of the splint or cast until it is fully hardened. This may take several hours.  Do not use any products that contain nicotine  or tobacco, such as cigarettes and e-cigarettes. These can delay bone healing. If you need help quitting, ask your health care provider.  Take over-the-counter and prescription medicines only as told by your health care provider.  Ask your health care provider if the medicine prescribed to you: ? Requires you to avoid driving or using machinery. ? Can cause constipation. You may need to take these actions to prevent or treat constipation:  Drink enough fluid to keep your urine pale yellow.  Take over-the-counter or prescription medicines.  Eat foods that are high in fiber, such as beans, whole grains, and fresh fruits and vegetables.  Limit foods that are high in fat and processed sugars, such as fried or sweet foods.  Keep all follow-up visits. This is important. Contact a health care provider if:  You have a fever.  Your pain medicine is not helping.  You have redness around your incision.  You have more pain or swelling around your incision.  You have blood or more fluid coming from your incision or leaking through your cast.  Your incision feels warm to the touch.  You have pus or a bad smell coming from your incision or from your cast or dressing. Get help right away if:  The edges of your incision come apart after the sutures or staples have been taken out.  You have chest pain or trouble breathing.  You have numbness or tingling in your foot or leg.  Your foot becomes cold, pale, or blue.  You have pain or swelling in your calf. These symptoms may represent a serious problem that is an emergency. Do not wait to see if the symptoms will go away. Get medical help right away. Call your local emergency services (911 in the [place] Do not drive yourself to the hospital. Summary  After the procedure, it is common to have some pain and swelling.  If your splint, boot, or cast is not waterproof, do not let it get wet.  Contact your health care provider if you have more  pain or swelling, or if you have blood or more fluid coming from your incision or leaking through your cast.  Get help right away if you have numbness or tingling in your foot or leg, or if your foot becomes cold, pale, or blue. This information is not intended to replace advice given to you by your health care provider. Make sure you discuss any questions you have with your health care provider. Document Revised: 11/18/2019 Document Reviewed: 11/18/2019 Elsevier Patient Education  2021 [place].   Non weight bearing  Keep elevated   Post Anesthesia Home Care Instructions  Activity: Get plenty of rest for the remainder of the day. A responsible adult should stay with you for 24  hours following the procedure.  For the next 24 hours, DO NOT: -Drive a car -Paediatric nurse -Drink alcoholic beverages -Take any medication unless instructed by your physician -Make any legal decisions or sign important papers.  Meals: Start with liquid foods such as gelatin or soup. Progress to regular foods as tolerated. Avoid greasy, spicy, heavy foods. If nausea and/or vomiting occur, drink only clear liquids until the nausea and/or vomiting subsides. Call your physician if vomiting continues.  Special Instructions/Symptoms: Your throat may feel dry or sore from the anesthesia or the breathing tube placed in your throat during surgery. If this causes discomfort, gargle with warm salt water. The discomfort should disappear within 24 hours.  Regional Anesthesia Blocks  1. Numbness or the inability to move the "blocked" extremity may last from 3-48 hours after placement. The length of time depends on the medication injected and your individual response to the medication. If the numbness is not going away after 48 hours, call your surgeon.  2. The extremity that is blocked will need to be protected until the numbness is gone and the  Strength has returned. Because you cannot feel it, you will need to  take extra care to avoid injury. Because it may be weak, you may have difficulty moving it or using it. You may not know what position it is in without looking at it while the block is in effect.  3. For blocks in the legs and feet, returning to weight bearing and walking needs to be done carefully. You will need to wait until the numbness is entirely gone and the strength has returned. You should be able to move your leg and foot normally before you try and bear weight or walk. You will need someone to be with you when you first try to ensure you do not fall and possibly risk injury.  4. Bruising and tenderness at the needle site are common side effects and will resolve in a few days.  5. Persistent numbness or new problems with movement should be communicated to the surgeon or the [facility] [phone] [place] [phone]).

## 2020-11-24 NOTE — Anesthesia Procedure Notes (Signed)
Procedure Name: LMA Insertion Date/Time: 11/24/2020 9:08 AM Performed by: Justice Rocher, CRNA Pre-anesthesia Checklist: Patient identified, Emergency Drugs available, Suction available, Patient being monitored and Timeout performed Patient Re-evaluated:Patient Re-evaluated prior to induction Oxygen Delivery Method: Circle system utilized Preoxygenation: Pre-oxygenation with 100% oxygen Induction Type: IV induction Ventilation: Mask ventilation without difficulty LMA: LMA inserted LMA Size: 4.0 Number of attempts: 1 Airway Equipment and Method: Bite block Placement Confirmation: positive ETCO2,  breath sounds checked- equal and bilateral and CO2 detector Tube secured with: Tape Dental Injury: Teeth and Oropharynx as per pre-operative assessment

## 2020-11-24 NOTE — Anesthesia Preprocedure Evaluation (Addendum)
Anesthesia Evaluation  Patient identified by MRN, date of birth, ID band Patient awake    Reviewed: Allergy & Precautions, NPO status , Patient's Chart, lab work & pertinent test results  Airway Mallampati: II  TM Distance: >3 FB Neck ROM: Full    Dental  (+) Partial Upper, Dental Advisory Given,    Pulmonary former smoker,  Quit smoking 2000   Pulmonary exam normal breath sounds clear to auscultation       Cardiovascular hypertension, Pt. on medications Normal cardiovascular exam Rhythm:Regular Rate:Normal     Neuro/Psych  Headaches, PSYCHIATRIC DISORDERS Anxiety Depression  Neuromuscular disease (RLS)    GI/Hepatic Neg liver ROS, GERD  Medicated and Controlled,S/p gastric sleeve 2019   Endo/Other  Obesity BMI 35  Renal/GU negative Renal ROS  negative genitourinary   Musculoskeletal  (+) Arthritis , Osteoarthritis,  Right ankle fx   Abdominal (+) + obese,   Peds  Hematology negative hematology ROS (+)   Anesthesia Other Findings   Reproductive/Obstetrics negative OB ROS                            Anesthesia Physical Anesthesia Plan  ASA: II  Anesthesia Plan: General and Regional   Post-op Pain Management:    Induction: Intravenous  PONV Risk Score and Plan: Ondansetron, Dexamethasone, Midazolam and Treatment may vary due to age or medical condition  Airway Management Planned: LMA  Additional Equipment: None  Intra-op Plan:   Post-operative Plan: Extubation in OR  Informed Consent: I have reviewed the patients History and Physical, chart, labs and discussed the procedure including the risks, benefits and alternatives for the proposed anesthesia with the patient or authorized representative who has indicated his/her understanding and acceptance.     Dental advisory given  Plan Discussed with: CRNA  Anesthesia Plan Comments:        Anesthesia Quick Evaluation

## 2020-11-24 NOTE — Transfer of Care (Signed)
Immediate Anesthesia Transfer of Care Note  Patient: Sheryl Cooper  Procedure(s) Performed: Procedure(s) (LRB): OPEN REDUCTION INTERNAL FIXATION (ORIF) ANKLE FRACTURE (Right)  Patient Location: PACU  Anesthesia Type: General  Level of Consciousness: awake, sedated, patient cooperative and responds to stimulation  Airway & Oxygen Therapy: Patient Spontanous Breathing and Patient connected to [place] 02 and soft FM   Post-op Assessment: Report given to PACU RN, Post -op Vital signs reviewed and stable and Patient moving all extremities  Post vital signs: Reviewed and stable  Complications: No apparent anesthesia complications

## 2020-11-24 NOTE — Op Note (Signed)
11/24/2020  12:28 PM  PATIENT:  Sheryl Cooper    PRE-OPERATIVE DIAGNOSIS:  RIGHT ANKLE FRACTURE  POST-OPERATIVE DIAGNOSIS:  Same  PROCEDURE:  OPEN REDUCTION INTERNAL FIXATION (ORIF) ANKLE FRACTURE  SURGEON:  [name], MD  ASSISTANT: Aggie Moats, PA-C, he was present and scrubbed throughout the case, critical for completion in a timely fashion, and for retraction, instrumentation, and closure.   ANESTHESIA:   lma/block   PREOPERATIVE INDICATIONS:  [name] is a  60 y.o. female with a diagnosis of RIGHT ANKLE FRACTURE who failed conservative measures and elected for surgical management.    The risks benefits and alternatives were discussed with the patient preoperatively including but not limited to the risks of infection, bleeding, nerve injury, cardiopulmonary complications, the need for revision surgery, among others, and the patient was willing to proceed.  OPERATIVE IMPLANTS: stryker plate and can screw, arthrex tight rope  OPERATIVE FINDINGS: Unstable ankle fracture. Stable syndesmosis post op  BLOOD LOSS: min  COMPLICATIONS: none  TOURNIQUET TIME: 56min  OPERATIVE PROCEDURE:  Patient was identified in the preoperative holding area and site was marked by me He was transported to the operating theater and placed on the table in supine position taking care to pad all bony prominences. After a preincinduction time out anesthesia was induced. The right lower extremity was prepped and draped in normal sterile fashion and a pre-incision timeout was performed. Sheryl Cooper received ancef for preoperative antibiotics.   I made a lateral incision of roughly 7 cm dissection was carried down sharply to the distal fibula and then spreading dissection was used proximally to protect the superficial peroneal nerve. I sharply incised the periosteum and took care to protect the peroneal tendons. I then debrided the fracture site and performed a reduction maneuver  which was held in place with a clamp.   I placed a lag screw across the fracture  I then selected a 7-hole one third tubular plate and placed in a neutralization fashion care was taken distally so as not to penetrate the joint with the cancellus screws.  I then turned my attention medially where I created a 4 cm incision and dissected sharply down to the medial Mal fracture taking care to protect the saphenous vein. I debrided the fracture and reduced and held in place with a tenaculum. I then drilled and placed a partially threaded cannulated screws one anterior and one posterior across the fracture.  I then stressed the syndesmosis and for syndesmotic fixation I performed a reduction maneuver with a clamp and placed a tight rope  I assesed the posterior mal piece and it was small enough to not require fixation as it involved less than 20% of the articular surface  The wound was then thoroughly irrigated and closed using a 0 Vicryl and absorbable Monocryl sutures. He was placed in a short leg splint.   POST OPERATIVE PLAN: Non-weightbearing. DVT prophylaxis will consist of mobilization and chemical px

## 2020-11-26 ENCOUNTER — Telehealth: Payer: Self-pay | Admitting: Internal Medicine

## 2020-11-26 NOTE — Telephone Encounter (Signed)
Called patient to remind them of their procedure on 12-01-2020 and patient stated she fell and broke her ankle and just had surgery on Tuesday.  She cancelled appointment and will call back to reschedule.  I will put a recall in for 8 weeks since she said the doctor told her it should take around that time.

## 2020-11-27 ENCOUNTER — Encounter (HOSPITAL_BASED_OUTPATIENT_CLINIC_OR_DEPARTMENT_OTHER): Payer: Self-pay | Admitting: Orthopedic Surgery

## 2020-11-28 ENCOUNTER — Encounter (INDEPENDENT_AMBULATORY_CARE_PROVIDER_SITE_OTHER): Payer: Self-pay

## 2020-12-01 ENCOUNTER — Encounter: Payer: Self-pay | Admitting: Internal Medicine

## 2020-12-11 ENCOUNTER — Other Ambulatory Visit (HOSPITAL_COMMUNITY): Payer: Self-pay

## 2020-12-11 DIAGNOSIS — S82851D Displaced trimalleolar fracture of right lower leg, subsequent encounter for closed fracture with routine healing: Secondary | ICD-10-CM | POA: Diagnosis not present

## 2020-12-16 ENCOUNTER — Other Ambulatory Visit: Payer: Self-pay | Admitting: Orthopedic Surgery

## 2020-12-17 ENCOUNTER — Other Ambulatory Visit (HOSPITAL_COMMUNITY): Payer: Self-pay

## 2020-12-17 MED ORDER — OXYCODONE HCL 5 MG PO TABS
ORAL_TABLET | ORAL | 0 refills | Status: DC
  Filled 2020-12-17: qty 20, 5d supply, fill #0

## 2020-12-17 MED ORDER — METHOCARBAMOL 500 MG PO TABS
ORAL_TABLET | ORAL | 0 refills | Status: DC
  Filled 2020-12-17: qty 15, 5d supply, fill #0

## 2020-12-19 ENCOUNTER — Other Ambulatory Visit (HOSPITAL_COMMUNITY): Payer: Self-pay

## 2020-12-21 ENCOUNTER — Other Ambulatory Visit (HOSPITAL_COMMUNITY): Payer: Self-pay

## 2021-01-06 ENCOUNTER — Other Ambulatory Visit (HOSPITAL_COMMUNITY): Payer: Self-pay

## 2021-01-06 MED FILL — Fluoxetine HCl Cap 20 MG: ORAL | 90 days supply | Qty: 270 | Fill #0 | Status: AC

## 2021-01-08 DIAGNOSIS — S82851D Displaced trimalleolar fracture of right lower leg, subsequent encounter for closed fracture with routine healing: Secondary | ICD-10-CM | POA: Diagnosis not present

## 2021-01-11 ENCOUNTER — Other Ambulatory Visit: Payer: Self-pay | Admitting: Family Medicine

## 2021-01-11 ENCOUNTER — Encounter: Payer: Self-pay | Admitting: Family Medicine

## 2021-01-11 ENCOUNTER — Other Ambulatory Visit (HOSPITAL_COMMUNITY): Payer: Self-pay

## 2021-01-11 MED ORDER — TRAMADOL HCL 50 MG PO TABS
50.0000 mg | ORAL_TABLET | Freq: Three times a day (TID) | ORAL | 3 refills | Status: DC | PRN
  Filled 2021-01-11: qty 90, 30d supply, fill #0
  Filled 2021-03-24: qty 90, 30d supply, fill #1

## 2021-01-13 ENCOUNTER — Other Ambulatory Visit (HOSPITAL_COMMUNITY): Payer: Self-pay

## 2021-01-13 ENCOUNTER — Encounter (INDEPENDENT_AMBULATORY_CARE_PROVIDER_SITE_OTHER): Payer: Self-pay

## 2021-01-16 ENCOUNTER — Other Ambulatory Visit (HOSPITAL_COMMUNITY): Payer: Self-pay

## 2021-01-18 ENCOUNTER — Other Ambulatory Visit (HOSPITAL_COMMUNITY): Payer: Self-pay

## 2021-01-18 MED ORDER — METHOCARBAMOL 500 MG PO TABS
ORAL_TABLET | ORAL | 0 refills | Status: DC
  Filled 2021-01-18: qty 15, 5d supply, fill #0

## 2021-01-20 ENCOUNTER — Encounter (HOSPITAL_COMMUNITY): Payer: Self-pay | Admitting: *Deleted

## 2021-01-27 ENCOUNTER — Encounter: Payer: Self-pay | Admitting: Internal Medicine

## 2021-02-02 ENCOUNTER — Other Ambulatory Visit (HOSPITAL_COMMUNITY): Payer: Self-pay

## 2021-02-19 DIAGNOSIS — S82851D Displaced trimalleolar fracture of right lower leg, subsequent encounter for closed fracture with routine healing: Secondary | ICD-10-CM | POA: Diagnosis not present

## 2021-03-24 ENCOUNTER — Other Ambulatory Visit: Payer: Self-pay | Admitting: Family Medicine

## 2021-03-24 DIAGNOSIS — K8070 Calculus of gallbladder and bile duct without cholecystitis without obstruction: Secondary | ICD-10-CM

## 2021-03-24 DIAGNOSIS — R1013 Epigastric pain: Secondary | ICD-10-CM

## 2021-03-24 DIAGNOSIS — R109 Unspecified abdominal pain: Secondary | ICD-10-CM

## 2021-03-24 DIAGNOSIS — R140 Abdominal distension (gaseous): Secondary | ICD-10-CM

## 2021-03-24 MED FILL — Bupropion HCl Tab ER 12HR 150 MG: ORAL | 90 days supply | Qty: 180 | Fill #0 | Status: AC

## 2021-03-25 ENCOUNTER — Other Ambulatory Visit (HOSPITAL_COMMUNITY): Payer: Self-pay

## 2021-03-25 MED ORDER — PANTOPRAZOLE SODIUM 40 MG PO TBEC
DELAYED_RELEASE_TABLET | Freq: Every day | ORAL | 1 refills | Status: DC
  Filled 2021-03-25: qty 90, 90d supply, fill #0
  Filled 2021-08-10: qty 90, 90d supply, fill #1

## 2021-03-25 MED ORDER — FLUOXETINE HCL 20 MG PO CAPS
ORAL_CAPSULE | Freq: Every day | ORAL | 3 refills | Status: DC
  Filled 2021-03-25: qty 270, 90d supply, fill #0

## 2021-03-29 ENCOUNTER — Other Ambulatory Visit: Payer: Self-pay | Admitting: Orthopedic Surgery

## 2021-03-29 ENCOUNTER — Other Ambulatory Visit (HOSPITAL_COMMUNITY): Payer: Self-pay

## 2021-04-09 ENCOUNTER — Other Ambulatory Visit (HOSPITAL_COMMUNITY): Payer: Self-pay

## 2021-04-09 DIAGNOSIS — M545 Low back pain, unspecified: Secondary | ICD-10-CM | POA: Diagnosis not present

## 2021-04-09 DIAGNOSIS — S82851D Displaced trimalleolar fracture of right lower leg, subsequent encounter for closed fracture with routine healing: Secondary | ICD-10-CM | POA: Diagnosis not present

## 2021-04-09 MED ORDER — METHYLPREDNISOLONE 4 MG PO TBPK
ORAL_TABLET | ORAL | 0 refills | Status: DC
  Filled 2021-04-09: qty 21, 6d supply, fill #0

## 2021-04-30 ENCOUNTER — Other Ambulatory Visit: Payer: Self-pay

## 2021-04-30 ENCOUNTER — Ambulatory Visit: Payer: 59 | Attending: Orthopedic Surgery | Admitting: Physical Therapy

## 2021-04-30 DIAGNOSIS — M6281 Muscle weakness (generalized): Secondary | ICD-10-CM | POA: Diagnosis not present

## 2021-04-30 DIAGNOSIS — M545 Low back pain, unspecified: Secondary | ICD-10-CM | POA: Insufficient documentation

## 2021-04-30 DIAGNOSIS — M79661 Pain in right lower leg: Secondary | ICD-10-CM | POA: Diagnosis not present

## 2021-04-30 DIAGNOSIS — R262 Difficulty in walking, not elsewhere classified: Secondary | ICD-10-CM | POA: Diagnosis not present

## 2021-04-30 DIAGNOSIS — G8929 Other chronic pain: Secondary | ICD-10-CM | POA: Diagnosis not present

## 2021-04-30 NOTE — Therapy (Signed)
[place], [place], [address] Phone: [phone]   Fax:  [phone]  Physical Therapy Evaluation  Patient Details  Name: Sheryl Cooper MRN: [id number] Date of Birth: 02-13-1961 Referring Provider (PT): Dr. Edmonia Lynch   Encounter Date: 04/30/2021   PT End of Session - 04/30/21 1005     Visit Number 1    Number of Visits 16    Date for PT Re-Evaluation 06/25/21    Authorization Type UMR    PT Start Time 0803    PT Stop Time 0846    PT Time Calculation (min) 43 min    Activity Tolerance Patient tolerated treatment well    Behavior During Therapy [facility] for tasks assessed/performed             Past Medical History:  Diagnosis Date   Anxiety    Arthritis    Closed right ankle fracture    Depression    Foot fracture, left    GERD (gastroesophageal reflux disease)    History of chest pain    History of panic attacks    History of syncope    2012  though possible seizure,  per pt not seizure and no episode since   Hypertension    followed by pcp   (pt had cardiac cath @[facility]   normal coroniars and normal lvsf   Insomnia    Migraines    Restless leg syndrome    S/P gastric sleeve procedure 06/25/2018   Wears glasses     Past Surgical History:  Procedure Laterality Date   CARDIAC CATHETERIZATION  06-22-2015  @[facility]    for chest pain/ precordial pain----  normal coronaries with lvsf 50%   COLONOSCOPY  07/2013   WITH POLYP REMOVAL   HYSTEROSCOPY W/ ENDOMETRIAL ABLATION  03-23-2010  @[facility]    LAPAROSCOPIC GASTRIC SLEEVE RESECTION N/A 06/25/2018   Procedure: LAPAROSCOPIC GASTRIC SLEEVE RESECTION, UPPER ENDO, ERAS PATHWAY;  Surgeon: Excell Seltzer, MD;  Location: [facility];  Service: General;  Laterality: N/A;   ORIF ANKLE FRACTURE Right 11/24/2020   Procedure: OPEN REDUCTION INTERNAL FIXATION (ORIF) ANKLE FRACTURE;  Surgeon: [name], MD;  Location: [place];  Service:  Orthopedics;  Laterality: Right;   POLYPECTOMY     TONSILLECTOMY  1976    There were no vitals filed for this visit.    Subjective Assessment - 04/30/21 0809     Subjective Patient fractured her ankle in April 2022 while walking on a ramp and fell into a hole.  She was able to see the MD on the cruise and discovered she fractured the Rt ankle and Lt ankle did not require surgery, was in a Boot. She was NWB for 8 weeks following her Rt ankle surgical. She has difficulty with walking up and down steps.  She uses her cane, somedays it feels like the whole leg gives out.  She does have Rt knee arthritis.  She is only able to sit for 45 min at a time due to swelling and numbness.  She does try to walk during the day but at the end of the day she "can barely walk even with her cane".  She has chronic back pain due to arthritis which has flared up since her level of activity was so low for some time.  She has pain with bending, walking and standing and sitting too long.    How long can you sit comfortably? 45 min then I hobble  How long can you walk comfortably? usually 30-45 min, unable to do    Diagnostic tests Sept. 2002 XR Rt foot normal., Narrowing present in L spine L5-S1    Patient Stated Goals Patient would like to walk normal.    Currently in Pain? Yes    Pain Score 4     Pain Location Leg    Pain Orientation Right;Lower    Pain Descriptors / Indicators Throbbing    Pain Type Chronic pain    Pain Radiating Towards medial ankle    Pain Onset More than a month ago    Pain Frequency Intermittent    Aggravating Factors  walking, standing    Pain Relieving Factors rest, elevating    Effect of Pain on Daily Activities hard to walk, sitting    Multiple Pain Sites Yes    Pain Score 6    Pain Location Back    Pain Orientation Lower    Pain Descriptors / Indicators Aching;Tightness;Sore    Pain Type Chronic pain    Pain Radiating Towards can sometimes radiate to both hips and legs, can be  numb    Pain Onset More than a month ago    Pain Frequency Intermittent    Aggravating Factors  walking, prolonged positioning    Pain Relieving Factors change pos.  ,meds    Effect of Pain on Daily Activities feels unsteady and hard to be "me"                [facility] PT Assessment - 04/30/21 0001       Assessment   Medical Diagnosis Rt ankle ORIF, back pain    Referring Provider (PT) Dr. Edmonia Lynch    Onset Date/Surgical Date 11/20/20    Next MD Visit unknown    Prior Therapy No      Precautions   Precautions None      Restrictions   Weight Bearing Restrictions No      Balance Screen   Has the patient fallen in the past 6 months Yes    How many times? 4    Has the patient had a decrease in activity level because of a fear of falling?  Yes    Is the patient reluctant to leave their home because of a fear of falling?  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, patient accoutning    Leisure likes to walk at the park, reading      Cognition   Overall Cognitive Status Within Functional Limits for tasks assessed      Observation/Other Assessments   Focus on Therapeutic Outcomes (FOTO)  40%      Observation/Other Assessments-Edema    Edema --   no asymmetry this AM     Sensation   Light Touch Appears Intact    Hot/Cold Not tested    Proprioception Impaired by gross assessment    Additional Comments decreased ankle proprioception, awareness  Coordination   Gross Motor Movements are Fluid and Coordinated Not tested       Squat   Comments knee pain, cues for form      AROM   Right/Left Ankle --   L ankle EV 25, Inv 25   Right Ankle Dorsiflexion 8    Right Ankle Plantar Flexion 53    Right Ankle Inversion 21    Right Ankle Eversion 20    Lumbar Flexion mid shin    Lumbar Extension 50% pain    Lumbar - Right Side Bend pain , WFL    Lumbar - Left Side Bend pain , WFL    Lumbar - Right Rotation pain, WFL    Lumbar - Left Rotation pain, WFL      Strength   Right Hip Flexion 4/5    Left Hip Flexion 4/5    Right Knee Flexion 4/5   pain   Right Knee Extension 4/5    Left Knee Flexion 5/5    Left Knee Extension 5/5    Right Ankle Dorsiflexion 5/5    Right Ankle Plantar Flexion 4-/5    Right Ankle Inversion 4/5    Right Ankle Eversion 4-/5    Left Ankle Dorsiflexion 5/5    Left Ankle Plantar Flexion 4/5    Left Ankle Inversion 4/5    Left Ankle Eversion 4/5      Palpation   Palpation comment soreness in Rt lower leg, calf, medial knee (bruising present)      Special Tests   Other special tests pain with Rt SLR end range (neg SLR)      Transfers   Five time sit to stand comments  23 sec      Ambulation/Gait   Gait Comments 1.4 feet /sec              Objective measurements completed on examination: See above findings.      PT Education - 04/30/21 1005     Education Details PT/POC, gait pattern, eval findings    Person(s) Educated Patient    Methods Explanation    Comprehension Verbalized understanding              PT Short Term Goals - 04/30/21 1005       PT SHORT TERM GOAL #1   Title Pt will be I with HEP    Time 4    Period Weeks    Status New    Target Date 05/28/21      PT SHORT TERM GOAL #2   Title Pt will be able to walk with more confidence in her home , less need for supervision    Time 4    Period Weeks    Status New    Target Date 05/28/21      PT SHORT TERM GOAL #3   Title Pt will understand FOTO results for ankle and potential to improve functional  mobility    Baseline 40%    Time 4    Period Weeks    Status New    Target Date 05/28/21      PT SHORT TERM GOAL #4   Title Pt will be screened for balance and goal set    Baseline not done eval    Time 4    Period Weeks    Status New    Target Date 05/28/21               PT Long Term Goals - 04/30/21 1006  PT LONG TERM GOAL #1   Title Pt will be able to improve FOTO score in ankle to 58% or better    Baseline 40%    Time 8    Period Weeks    Status New    Target Date 06/25/21      PT LONG TERM GOAL #2   Title Pt will be able to walk with cane with improved knee and ankle mobility (flexion) to reduce strain on back    Time 8    Period Weeks    Status New    Target Date 06/25/21      PT LONG TERM GOAL #3   Title Pt will demo ankle strength in all planes to 5/5    Baseline 4-/5 to 4/5    Time 8    Period Weeks    Status New    Target Date 06/25/21      PT LONG TERM GOAL #4   Title Pt will be abwl to improve STS x 5 time to 15 sec or less    Baseline 23 sec    Time 8    Period Weeks    Status New    Target Date 06/25/21      PT LONG TERM GOAL #5   Title balance goal TBA based on initial results    Time 8    Period Weeks    Status New    Target Date 06/25/21                    Plan - 04/30/21 0932     Clinical Impression Statement Patient presetns for mod complexity eval of Rt ankle s/p ORIF and low back pain.  She unfortunately has not had PT since her surgery.  She presents with pain with trunk ROM, abnormal gait pattern, weakness in core and hips.  She has decent ROM in Rt ankle but weakness and inability to standing on her Rt LE without UE.  She is also limited by Rt knee pain and weakness. She will benefit from skiled PT to work on gross lower body strength, core strenght and balance work to prevent further falls.    Personal Factors and Comorbidities Comorbidity 3+;Time since onset of injury/illness/exacerbation;Social Background     Comorbidities knee pain, falls, HTN, anxiety    Examination-Activity Limitations Reach Overhead;Stairs;Bed Mobility;Bend;Sit;Stand;Lift;Carry;Locomotion Level;Squat;Transfers    Examination-Participation Restrictions Cleaning;Interpersonal Relationship;Meal Prep;Yard Work;Occupation;Community Activity;Shop    Stability/Clinical Decision Making Evolving/Moderate complexity    Clinical Decision Making Moderate    Rehab Potential Good    PT Frequency 2x / week    PT Duration 8 weeks    PT Treatment/Interventions ADLs/Self Care Home Management;Electrical Stimulation;Cryotherapy;Moist Heat;Stair training;Functional mobility training;Therapeutic activities;Neuromuscular re-education;Balance training;Therapeutic exercise;Patient/family education;Manual techniques;Passive range of motion;Dry needling;Taping;Vasopneumatic Device;Gait training    PT Next Visit Plan HEP, hips/knee, ankle. gait (balance/TUG/BERG)    PT Home Exercise Plan none on eval    Consulted and Agree with Plan of Care Patient             Patient will benefit from skilled therapeutic intervention in order to improve the following deficits and impairments:  Abnormal gait, Decreased activity tolerance, Decreased endurance, Decreased range of motion, Decreased strength, Increased fascial restricitons, Pain, Improper body mechanics, Obesity, Postural dysfunction, Impaired flexibility, Difficulty walking, Decreased mobility, Decreased balance, Increased edema  Visit Diagnosis: Difficulty in walking, not elsewhere classified  Pain in right lower leg  Muscle weakness (generalized)  Chronic bilateral low back pain, unspecified whether  sciatica present     Problem List Patient Active Problem List   Diagnosis Date Noted   Gait abnormality 01/08/2020   Anisocoria 01/08/2020   Benign essential tremor 12/30/2016   Health care maintenance 10/30/2015   Gastro-esophageal reflux disease without esophagitis 06/20/2015   Anxiety  and depression 07/08/2014   Chronic post-traumatic stress disorder (PTSD) 06/13/2014   Venous insufficiency 10/25/2013   Restless leg syndrome 05/28/2013   Insomnia 05/28/2013   HTN (hypertension) 09/24/2010   PANIC ATTACK 02/24/2010   DJD (degenerative joint disease) of knee 02/24/2010    Knoxx Boeding, PT 04/30/2021, 12:04 PM  [facility] [address] [place], [place], [address] Phone: [phone]   Fax:  [phone]  Name: MYIA BERGH MRN: [id number] Date of Birth: Apr 10, 1961  Raeford Razor, PT 04/30/21 12:04 PM Phone: [phone] Fax: [phone]

## 2021-05-03 ENCOUNTER — Other Ambulatory Visit (HOSPITAL_COMMUNITY): Payer: Self-pay

## 2021-05-07 ENCOUNTER — Ambulatory Visit: Payer: 59 | Attending: Orthopedic Surgery | Admitting: Physical Therapy

## 2021-05-07 DIAGNOSIS — G8929 Other chronic pain: Secondary | ICD-10-CM | POA: Insufficient documentation

## 2021-05-07 DIAGNOSIS — M545 Low back pain, unspecified: Secondary | ICD-10-CM | POA: Insufficient documentation

## 2021-05-07 DIAGNOSIS — R262 Difficulty in walking, not elsewhere classified: Secondary | ICD-10-CM | POA: Insufficient documentation

## 2021-05-07 DIAGNOSIS — M79661 Pain in right lower leg: Secondary | ICD-10-CM | POA: Insufficient documentation

## 2021-05-07 DIAGNOSIS — M6281 Muscle weakness (generalized): Secondary | ICD-10-CM | POA: Insufficient documentation

## 2021-05-10 ENCOUNTER — Other Ambulatory Visit (HOSPITAL_BASED_OUTPATIENT_CLINIC_OR_DEPARTMENT_OTHER): Payer: Self-pay

## 2021-05-12 ENCOUNTER — Other Ambulatory Visit: Payer: Self-pay

## 2021-05-12 ENCOUNTER — Ambulatory Visit: Payer: 59 | Admitting: Physical Therapy

## 2021-05-12 DIAGNOSIS — M79661 Pain in right lower leg: Secondary | ICD-10-CM

## 2021-05-12 DIAGNOSIS — M545 Low back pain, unspecified: Secondary | ICD-10-CM

## 2021-05-12 DIAGNOSIS — G8929 Other chronic pain: Secondary | ICD-10-CM | POA: Diagnosis present

## 2021-05-12 DIAGNOSIS — R262 Difficulty in walking, not elsewhere classified: Secondary | ICD-10-CM | POA: Diagnosis not present

## 2021-05-12 DIAGNOSIS — M6281 Muscle weakness (generalized): Secondary | ICD-10-CM | POA: Diagnosis present

## 2021-05-12 NOTE — Therapy (Addendum)
[place], [place], [address] Phone: [phone]   Fax:  [phone]  Physical Therapy Treatment/Discharge  Patient Details  Name: TIONNA GIGANTE MRN: [id number] Date of Birth: 31-May-1961 Referring Provider (PT): Dr. Edmonia Lynch   Encounter Date: 05/12/2021   PT End of Session - 05/12/21 1334     Visit Number 2    Number of Visits 16    Date for PT Re-Evaluation 06/25/21    Authorization Type UMR    PT Start Time 1333    PT Stop Time 1413    PT Time Calculation (min) 40 min    Activity Tolerance Patient tolerated treatment well    Behavior During Therapy [facility] for tasks assessed/performed             Past Medical History:  Diagnosis Date   Anxiety    Arthritis    Closed right ankle fracture    Depression    Foot fracture, left    GERD (gastroesophageal reflux disease)    History of chest pain    History of panic attacks    History of syncope    2012  though possible seizure,  per pt not seizure and no episode since   Hypertension    followed by pcp   (pt had cardiac cath @[facility]   normal coroniars and normal lvsf   Insomnia    Migraines    Restless leg syndrome    S/P gastric sleeve procedure 06/25/2018   Wears glasses     Past Surgical History:  Procedure Laterality Date   CARDIAC CATHETERIZATION  06-22-2015  @[facility]    for chest pain/ precordial pain----  normal coronaries with lvsf 50%   COLONOSCOPY  07/2013   WITH POLYP REMOVAL   HYSTEROSCOPY W/ ENDOMETRIAL ABLATION  03-23-2010  @[facility]    LAPAROSCOPIC GASTRIC SLEEVE RESECTION N/A 06/25/2018   Procedure: LAPAROSCOPIC GASTRIC SLEEVE RESECTION, UPPER ENDO, ERAS PATHWAY;  Surgeon: Excell Seltzer, MD;  Location: [facility];  Service: General;  Laterality: N/A;   ORIF ANKLE FRACTURE Right 11/24/2020   Procedure: OPEN REDUCTION INTERNAL FIXATION (ORIF) ANKLE FRACTURE;  Surgeon: [name], MD;  Location: [place];   Service: Orthopedics;  Laterality: Right;   POLYPECTOMY     TONSILLECTOMY  1976    There were no vitals filed for this visit.   Subjective Assessment - 05/12/21 1334     Subjective Pt has knee pain today, 5/10, back as well.  She has a new recumbent bike she hopes to be able to use next week. She recently moved into a new home, missed last week due to moving.    Currently in Pain? Yes    Pain Score 5     Pain Location Knee    Pain Orientation Right    Pain Type Chronic pain    Pain Onset More than a month ago    Pain Frequency Intermittent    Pain Score 5    Pain Location Back    Pain Orientation Lower    Pain Descriptors / Indicators Sore    Pain Type Chronic pain    Pain Onset More than a month ago    Pain Frequency Intermittent              Skilled therapy interventions:   Therapeutic Exercise: NuStep 6 min UE and LE L7  Seated LAQ with ball squeeze x 15  Supine post tilt with exhale x 10  Bridging  x 10  Lower trunk rotation x 10  Sidelying hip abd x 10 x 2  Sidelying clam 10 x 2  Standing ankle DF stretch 30 sec and lunge on step x 10 Ankle PF , bilateral heel raise x 15 at countertop      PT Short Term Goals - 04/30/21 1005       PT SHORT TERM GOAL #1   Title Pt will be I with HEP    Time 4    Period Weeks    Status New    Target Date 05/28/21      PT SHORT TERM GOAL #2   Title Pt will be able to walk with more confidence in her home , less need for supervision    Time 4    Period Weeks    Status New    Target Date 05/28/21      PT SHORT TERM GOAL #3   Title Pt will understand FOTO results for ankle and potential to improve functional mobility    Baseline 40%    Time 4    Period Weeks    Status New    Target Date 05/28/21      PT SHORT TERM GOAL #4   Title Pt will be screened for balance and goal set    Baseline not done eval    Time 4    Period Weeks    Status New    Target Date 05/28/21               PT Long Term Goals -  04/30/21 1006       PT LONG TERM GOAL #1   Title Pt will be able to improve FOTO score in ankle to 58% or better    Baseline 40%    Time 8    Period Weeks    Status New    Target Date 06/25/21      PT LONG TERM GOAL #2   Title Pt will be able to walk with cane with improved knee and ankle mobility (flexion) to reduce strain on back    Time 8    Period Weeks    Status New    Target Date 06/25/21      PT LONG TERM GOAL #3   Title Pt will demo ankle strength in all planes to 5/5    Baseline 4-/5 to 4/5    Time 8    Period Weeks    Status New    Target Date 06/25/21      PT LONG TERM GOAL #4   Title Pt will be abwl to improve STS x 5 time to 15 sec or less    Baseline 23 sec    Time 8    Period Weeks    Status New    Target Date 06/25/21      PT LONG TERM GOAL #5   Title balance goal TBA based on initial results    Time 8    Period Weeks    Status New    Target Date 06/25/21                   Plan - 05/12/21 1401     Clinical Impression Statement Patient has become more aware of her walking pattern has been trying to work on ankle mobility on her own.  She was given her initial HEP for core, hip and LE.  She did well and had no increased pain during exercise .  PT Treatment/Interventions ADLs/Self Care Home Management;Electrical Stimulation;Cryotherapy;Moist Heat;Stair training;Functional mobility training;Therapeutic activities;Neuromuscular re-education;Balance training;Therapeutic exercise;Patient/family education;Manual techniques;Passive range of motion;Dry needling;Taping;Vasopneumatic Device;Gait training    PT Next Visit Plan check HEP. gait /(balance/TUG/BERG). has fallen 2 x since eval    PT Home Exercise Plan none on eval    Consulted and Agree with Plan of Care Patient             Patient will benefit from skilled therapeutic intervention in order to improve the following deficits and impairments:  Abnormal gait, Decreased activity  tolerance, Decreased endurance, Decreased range of motion, Decreased strength, Increased fascial restricitons, Pain, Improper body mechanics, Obesity, Postural dysfunction, Impaired flexibility, Difficulty walking, Decreased mobility, Decreased balance, Increased edema  Visit Diagnosis: Difficulty in walking, not elsewhere classified  Pain in right lower leg  Muscle weakness (generalized)  Chronic bilateral low back pain, unspecified whether sciatica present     Problem List Patient Active Problem List   Diagnosis Date Noted   Gait abnormality 01/08/2020   Anisocoria 01/08/2020   Benign essential tremor 12/30/2016   Health care maintenance 10/30/2015   Gastro-esophageal reflux disease without esophagitis 06/20/2015   Anxiety and depression 07/08/2014   Chronic post-traumatic stress disorder (PTSD) 06/13/2014   Venous insufficiency 10/25/2013   Restless leg syndrome 05/28/2013   Insomnia 05/28/2013   HTN (hypertension) 09/24/2010   PANIC ATTACK 02/24/2010   DJD (degenerative joint disease) of knee 02/24/2010    Nelsie Domino, PT 05/12/2021, 2:14 PM  [place] [facility] [address] [place], [place], [address] Phone: [phone]   Fax:  [phone]  Name: DEBI COUSIN MRN: [id number] Date of Birth: [birth year]  Raeford Razor, PT 05/12/21 2:14 PM Phone: [phone] Fax: [phone]    PHYSICAL THERAPY DISCHARGE SUMMARY  Visits from Start of Care: 2  Current functional level related to goals / functional outcomes: unknown   Remaining deficits: unknown   Education / Equipment: HEP   Patient agrees to discharge. Patient goals were not met. Patient is being discharged due to not returning since the last visit. Raeford Razor, PT 08/18/21 2:53 PM Phone: [phone] Fax: [phone]

## 2021-05-14 ENCOUNTER — Other Ambulatory Visit: Payer: Self-pay

## 2021-05-14 ENCOUNTER — Emergency Department (HOSPITAL_COMMUNITY)
Admission: EM | Admit: 2021-05-14 | Discharge: 2021-05-15 | Disposition: A | Discharge status: Home / Self Care | Payer: 59 | Attending: Student | Admitting: Student

## 2021-05-14 ENCOUNTER — Telehealth: Payer: Self-pay | Admitting: Physical Therapy

## 2021-05-14 ENCOUNTER — Ambulatory Visit: Payer: 59 | Admitting: Physical Therapy

## 2021-05-14 ENCOUNTER — Encounter (HOSPITAL_COMMUNITY): Payer: Self-pay | Admitting: Emergency Medicine

## 2021-05-14 ENCOUNTER — Emergency Department (HOSPITAL_COMMUNITY): Payer: 59

## 2021-05-14 DIAGNOSIS — R079 Chest pain, unspecified: Secondary | ICD-10-CM | POA: Insufficient documentation

## 2021-05-14 DIAGNOSIS — Z5321 Procedure and treatment not carried out due to patient leaving prior to being seen by health care provider: Secondary | ICD-10-CM | POA: Insufficient documentation

## 2021-05-14 DIAGNOSIS — R0602 Shortness of breath: Secondary | ICD-10-CM | POA: Diagnosis not present

## 2021-05-14 LAB — BASIC METABOLIC PANEL
Anion gap: 8 (ref 5–15)
BUN: 17 mg/dL (ref 8–23)
CO2: 26 mmol/L (ref 22–32)
Calcium: 9.4 mg/dL (ref 8.9–10.3)
Chloride: 107 mmol/L (ref 98–111)
Creatinine, Ser: 0.9 mg/dL (ref 0.44–1.00)
GFR, Estimated: >60 mL/min (ref >60)
Glucose, Bld: 88 mg/dL (ref 70–99)
Potassium: 3.9 mmol/L (ref 3.5–5.1)
Sodium: 141 mmol/L (ref 135–145)

## 2021-05-14 LAB — CBC
HCT: 39 % (ref 36.0–46.0)
Hemoglobin: 12 g/dL (ref 12.0–15.0)
MCH: 26.5 pg (ref 26.0–34.0)
MCHC: 30.8 g/dL (ref 30.0–36.0)
MCV: 86.3 fL (ref 80.0–100.0)
Platelets: 377 10*3/uL (ref 150–400)
RBC: 4.52 MIL/uL (ref 3.87–5.11)
RDW: 17.4 % — ABNORMAL HIGH (ref 11.5–15.5)
WBC: 5.5 10*3/uL (ref 4.0–10.5)
nRBC: 0 % (ref 0.0–0.2)

## 2021-05-14 LAB — TROPONIN I (HIGH SENSITIVITY): Troponin I (High Sensitivity): 4 ng/L (ref <18)

## 2021-05-14 NOTE — Telephone Encounter (Signed)
Left message on voicemail regarding patient's missed appt this AM.  Reminded of her next appt as well as the cancellation, no-show policy.  Raeford Razor, PT 05/14/21 10:46 AM Phone: [phone] Fax: [phone]

## 2021-05-14 NOTE — ED Provider Notes (Signed)
Emergency Medicine Provider Triage Evaluation Note  Sheryl Cooper , a 60 y.o. female  was evaluated in triage.  Pt complains of chest pain, left arm pain, shoulder discomfort that began earlier this afternoon.  Has a history of panic attacks however this does not feel like that.  Felt as though her blood pressure is high as she has hypertension.  Was unable to check this at home.  No history of ACS.  Review of Systems  Positive: Chest pain, left arm discomfort, dizziness Negative: Nausea or vomiting  Physical Exam  BP (!) 165/102 (BP Location: Left Arm)   Pulse (!) 57   Temp 97.6 F (36.4 C) (Oral)   Resp 16   Ht 5\' 5"  (1.651 m)   Wt 102.1 kg   LMP 08/17/2012   SpO2 100%   BMI 37.44 kg/m  Gen:   Awake, no distress   Resp:  Normal effort  MSK:   Moves extremities without difficulty  Other:  Regular rhythm, slightly bradycardic.  CTAB  Medical Decision Making  Medically screening exam initiated at 7:22 PM.  Appropriate orders placed.  Areatha Keas was informed that the remainder of the evaluation will be completed by another provider, this initial triage assessment does not replace that evaluation, and the importance of remaining in the ED until their evaluation is complete.     Rhae Hammock, PA-C 05/14/21 1923    Malvin Johns, MD 05/17/21 [phone]

## 2021-05-14 NOTE — ED Triage Notes (Signed)
Patient c/o chest pain with SOB and L arm tension since noon today.

## 2021-05-15 ENCOUNTER — Encounter: Payer: Self-pay | Admitting: Family Medicine

## 2021-05-19 ENCOUNTER — Ambulatory Visit: Payer: 59

## 2021-05-19 ENCOUNTER — Telehealth: Payer: Self-pay | Admitting: Physical Therapy

## 2021-05-19 NOTE — Telephone Encounter (Signed)
Unable to leave a voicemail regarding her missed appt today 1:30 pm.   Raeford Razor, PT 05/19/21 4:47 PM Phone: [phone] Fax: [phone]

## 2021-05-21 ENCOUNTER — Ambulatory Visit: Payer: 59 | Admitting: Physical Therapy

## 2021-05-21 ENCOUNTER — Telehealth: Payer: Self-pay | Admitting: Physical Therapy

## 2021-05-21 NOTE — Telephone Encounter (Signed)
Pt was no show x 2 this week.  Will cancel her remaining appts per our policy and if she does return she will need to make 1 appt at a time.  Unable to leave voicemail when called.   Raeford Razor, PT 05/21/21 10:34 AM Phone: [phone] Fax: [phone]

## 2021-05-26 ENCOUNTER — Encounter: Payer: 59 | Admitting: Physical Therapy

## 2021-05-28 ENCOUNTER — Encounter: Payer: 59 | Admitting: Physical Therapy

## 2021-08-10 ENCOUNTER — Other Ambulatory Visit: Payer: Self-pay | Admitting: Orthopedic Surgery

## 2021-08-10 ENCOUNTER — Other Ambulatory Visit (HOSPITAL_COMMUNITY): Payer: Self-pay

## 2021-08-10 ENCOUNTER — Other Ambulatory Visit: Payer: Self-pay | Admitting: Family Medicine

## 2021-08-11 ENCOUNTER — Other Ambulatory Visit (HOSPITAL_COMMUNITY): Payer: Self-pay

## 2021-08-11 MED ORDER — HYDROCHLOROTHIAZIDE 25 MG PO TABS
ORAL_TABLET | Freq: Every day | ORAL | 1 refills | Status: DC
  Filled 2021-08-11: qty 90, 90d supply, fill #0
  Filled 2021-11-16: qty 90, 90d supply, fill #1

## 2021-08-16 ENCOUNTER — Other Ambulatory Visit (HOSPITAL_COMMUNITY): Payer: Self-pay

## 2021-10-08 NOTE — Progress Notes (Signed)
error 

## 2021-10-19 DIAGNOSIS — Z9884 Bariatric surgery status: Secondary | ICD-10-CM | POA: Insufficient documentation

## 2021-11-16 ENCOUNTER — Other Ambulatory Visit (HOSPITAL_COMMUNITY): Payer: Self-pay

## 2021-11-16 ENCOUNTER — Other Ambulatory Visit: Payer: Self-pay | Admitting: Family Medicine

## 2021-11-16 DIAGNOSIS — R109 Unspecified abdominal pain: Secondary | ICD-10-CM

## 2021-11-16 DIAGNOSIS — R140 Abdominal distension (gaseous): Secondary | ICD-10-CM

## 2021-11-16 DIAGNOSIS — R1013 Epigastric pain: Secondary | ICD-10-CM

## 2021-11-16 DIAGNOSIS — K8070 Calculus of gallbladder and bile duct without cholecystitis without obstruction: Secondary | ICD-10-CM

## 2021-11-16 MED ORDER — ROPINIROLE HCL 1 MG PO TABS
ORAL_TABLET | Freq: Every day | ORAL | 3 refills | Status: DC
  Filled 2021-11-16: qty 90, 90d supply, fill #0
  Filled 2022-02-14: qty 90, 90d supply, fill #1
  Filled 2022-05-10: qty 90, 90d supply, fill #2
  Filled 2022-08-10: qty 90, 90d supply, fill #3

## 2021-11-16 MED ORDER — LOSARTAN POTASSIUM 50 MG PO TABS
ORAL_TABLET | Freq: Every day | ORAL | 3 refills | Status: DC
  Filled 2021-11-16: qty 90, 90d supply, fill #0
  Filled 2022-02-14: qty 90, 90d supply, fill #1
  Filled 2022-05-18: qty 90, 90d supply, fill #2
  Filled 2022-08-10: qty 90, 90d supply, fill #3

## 2021-11-16 MED ORDER — PANTOPRAZOLE SODIUM 40 MG PO TBEC
DELAYED_RELEASE_TABLET | Freq: Every day | ORAL | 1 refills | Status: DC
  Filled 2021-11-16: qty 90, 90d supply, fill #0
  Filled 2022-02-14: qty 90, 90d supply, fill #1

## 2021-11-16 MED ORDER — TRAMADOL HCL 50 MG PO TABS
50.0000 mg | ORAL_TABLET | Freq: Three times a day (TID) | ORAL | 3 refills | Status: DC | PRN
  Filled 2021-11-16: qty 90, 30d supply, fill #0
  Filled 2022-04-16 – 2022-05-10 (×2): qty 90, 30d supply, fill #1

## 2021-11-17 ENCOUNTER — Other Ambulatory Visit (HOSPITAL_COMMUNITY): Payer: Self-pay

## 2021-11-24 ENCOUNTER — Other Ambulatory Visit (HOSPITAL_COMMUNITY): Payer: Self-pay

## 2022-01-04 ENCOUNTER — Encounter: Payer: Self-pay | Admitting: *Deleted

## 2022-01-19 ENCOUNTER — Encounter (HOSPITAL_COMMUNITY): Payer: Self-pay | Admitting: *Deleted

## 2022-02-14 ENCOUNTER — Other Ambulatory Visit: Payer: Self-pay | Admitting: Family Medicine

## 2022-02-14 ENCOUNTER — Other Ambulatory Visit: Payer: Self-pay | Admitting: Orthopedic Surgery

## 2022-02-14 ENCOUNTER — Other Ambulatory Visit (HOSPITAL_COMMUNITY): Payer: Self-pay

## 2022-02-14 MED ORDER — HYDROCHLOROTHIAZIDE 25 MG PO TABS
ORAL_TABLET | Freq: Every day | ORAL | 1 refills | Status: DC
  Filled 2022-02-14: qty 90, 90d supply, fill #0
  Filled 2022-05-10: qty 90, 90d supply, fill #1

## 2022-02-21 ENCOUNTER — Other Ambulatory Visit (HOSPITAL_COMMUNITY): Payer: Self-pay

## 2022-02-21 ENCOUNTER — Other Ambulatory Visit: Payer: Self-pay | Admitting: Orthopedic Surgery

## 2022-02-22 ENCOUNTER — Other Ambulatory Visit (HOSPITAL_COMMUNITY): Payer: Self-pay

## 2022-02-23 ENCOUNTER — Other Ambulatory Visit (HOSPITAL_COMMUNITY): Payer: Self-pay

## 2022-04-16 ENCOUNTER — Other Ambulatory Visit: Payer: Self-pay | Admitting: Orthopedic Surgery

## 2022-04-17 ENCOUNTER — Other Ambulatory Visit (HOSPITAL_COMMUNITY): Payer: Self-pay

## 2022-04-18 ENCOUNTER — Other Ambulatory Visit (HOSPITAL_COMMUNITY): Payer: Self-pay

## 2022-04-20 ENCOUNTER — Other Ambulatory Visit (HOSPITAL_COMMUNITY): Payer: Self-pay

## 2022-04-27 ENCOUNTER — Other Ambulatory Visit (HOSPITAL_COMMUNITY): Payer: Self-pay

## 2022-05-10 ENCOUNTER — Other Ambulatory Visit (HOSPITAL_COMMUNITY): Payer: Self-pay

## 2022-05-10 ENCOUNTER — Other Ambulatory Visit: Payer: Self-pay | Admitting: Family Medicine

## 2022-05-10 ENCOUNTER — Other Ambulatory Visit: Payer: Self-pay | Admitting: Orthopedic Surgery

## 2022-05-10 DIAGNOSIS — K8070 Calculus of gallbladder and bile duct without cholecystitis without obstruction: Secondary | ICD-10-CM

## 2022-05-10 DIAGNOSIS — R140 Abdominal distension (gaseous): Secondary | ICD-10-CM

## 2022-05-10 DIAGNOSIS — R1013 Epigastric pain: Secondary | ICD-10-CM

## 2022-05-10 DIAGNOSIS — R109 Unspecified abdominal pain: Secondary | ICD-10-CM

## 2022-05-11 ENCOUNTER — Other Ambulatory Visit (HOSPITAL_COMMUNITY): Payer: Self-pay

## 2022-05-11 MED ORDER — PANTOPRAZOLE SODIUM 40 MG PO TBEC
40.0000 mg | DELAYED_RELEASE_TABLET | Freq: Every day | ORAL | 1 refills | Status: DC
  Filled 2022-05-11: qty 90, 90d supply, fill #0
  Filled 2022-08-10: qty 90, 90d supply, fill #1

## 2022-05-18 ENCOUNTER — Other Ambulatory Visit: Payer: Self-pay | Admitting: Orthopedic Surgery

## 2022-05-18 ENCOUNTER — Other Ambulatory Visit (HOSPITAL_COMMUNITY): Payer: Self-pay

## 2022-05-18 NOTE — Telephone Encounter (Signed)
This patient is no longer under our care. Please stop sending these repeated Rx requests.

## 2022-05-27 ENCOUNTER — Other Ambulatory Visit (HOSPITAL_COMMUNITY): Payer: Self-pay

## 2022-08-10 ENCOUNTER — Other Ambulatory Visit: Payer: Self-pay | Admitting: Family Medicine

## 2022-08-10 ENCOUNTER — Other Ambulatory Visit: Payer: Self-pay | Admitting: Orthopedic Surgery

## 2022-08-10 ENCOUNTER — Other Ambulatory Visit (HOSPITAL_COMMUNITY): Payer: Self-pay

## 2022-08-11 ENCOUNTER — Other Ambulatory Visit (HOSPITAL_COMMUNITY): Payer: Self-pay

## 2022-08-11 ENCOUNTER — Other Ambulatory Visit: Payer: Self-pay

## 2022-08-11 MED ORDER — ONDANSETRON HCL 4 MG PO TABS
4.0000 mg | ORAL_TABLET | Freq: Every day | ORAL | 0 refills | Status: DC | PRN
  Filled 2022-08-11: qty 10, 10d supply, fill #0

## 2022-08-11 MED ORDER — ASPIRIN 81 MG PO TBEC
81.0000 mg | DELAYED_RELEASE_TABLET | Freq: Two times a day (BID) | ORAL | 0 refills | Status: DC
  Filled 2022-08-11: qty 60, 30d supply, fill #0

## 2022-08-11 MED ORDER — HYDROCHLOROTHIAZIDE 25 MG PO TABS
ORAL_TABLET | Freq: Every day | ORAL | 1 refills | Status: DC
  Filled 2022-08-11: qty 90, 90d supply, fill #0
  Filled 2022-11-02: qty 90, 90d supply, fill #1

## 2022-08-11 MED ORDER — TRAMADOL HCL 50 MG PO TABS
50.0000 mg | ORAL_TABLET | Freq: Three times a day (TID) | ORAL | 3 refills | Status: DC | PRN
  Filled 2022-08-11: qty 90, 30d supply, fill #0
  Filled 2022-11-02: qty 90, 30d supply, fill #1

## 2022-09-05 DIAGNOSIS — Z1211 Encounter for screening for malignant neoplasm of colon: Secondary | ICD-10-CM | POA: Diagnosis not present

## 2022-09-05 DIAGNOSIS — R110 Nausea: Secondary | ICD-10-CM | POA: Diagnosis not present

## 2022-09-05 DIAGNOSIS — I10 Essential (primary) hypertension: Secondary | ICD-10-CM | POA: Diagnosis not present

## 2022-09-05 DIAGNOSIS — K219 Gastro-esophageal reflux disease without esophagitis: Secondary | ICD-10-CM | POA: Diagnosis not present

## 2022-09-05 DIAGNOSIS — G4709 Other insomnia: Secondary | ICD-10-CM | POA: Diagnosis not present

## 2022-09-05 DIAGNOSIS — Z1231 Encounter for screening mammogram for malignant neoplasm of breast: Secondary | ICD-10-CM | POA: Diagnosis not present

## 2022-09-05 DIAGNOSIS — Z0000 Encounter for general adult medical examination without abnormal findings: Secondary | ICD-10-CM | POA: Diagnosis not present

## 2022-09-05 DIAGNOSIS — G2581 Restless legs syndrome: Secondary | ICD-10-CM | POA: Diagnosis not present

## 2022-09-06 ENCOUNTER — Other Ambulatory Visit: Payer: Self-pay | Admitting: Family Medicine

## 2022-09-06 DIAGNOSIS — Z1231 Encounter for screening mammogram for malignant neoplasm of breast: Secondary | ICD-10-CM

## 2022-09-09 ENCOUNTER — Other Ambulatory Visit: Payer: Self-pay

## 2022-09-09 ENCOUNTER — Other Ambulatory Visit (HOSPITAL_COMMUNITY): Payer: Self-pay

## 2022-09-09 MED ORDER — RIZATRIPTAN BENZOATE 10 MG PO TABS
10.0000 mg | ORAL_TABLET | Freq: Every day | ORAL | 3 refills | Status: DC
  Filled 2022-09-09: qty 30, 30d supply, fill #0

## 2022-09-12 ENCOUNTER — Other Ambulatory Visit (HOSPITAL_COMMUNITY): Payer: Self-pay

## 2022-11-02 ENCOUNTER — Ambulatory Visit: Payer: BLUE CROSS/BLUE SHIELD

## 2022-11-02 ENCOUNTER — Other Ambulatory Visit: Payer: Self-pay | Admitting: Family Medicine

## 2022-11-02 ENCOUNTER — Other Ambulatory Visit (HOSPITAL_COMMUNITY): Payer: Self-pay

## 2022-11-02 DIAGNOSIS — R1013 Epigastric pain: Secondary | ICD-10-CM

## 2022-11-02 DIAGNOSIS — K8070 Calculus of gallbladder and bile duct without cholecystitis without obstruction: Secondary | ICD-10-CM

## 2022-11-02 DIAGNOSIS — R109 Unspecified abdominal pain: Secondary | ICD-10-CM

## 2022-11-02 DIAGNOSIS — R140 Abdominal distension (gaseous): Secondary | ICD-10-CM

## 2022-11-02 MED ORDER — LOSARTAN POTASSIUM 50 MG PO TABS
50.0000 mg | ORAL_TABLET | Freq: Every day | ORAL | 0 refills | Status: DC
  Filled 2022-11-02: qty 90, 90d supply, fill #0

## 2022-11-02 MED ORDER — ONDANSETRON HCL 4 MG PO TABS
4.0000 mg | ORAL_TABLET | Freq: Every day | ORAL | 0 refills | Status: DC | PRN
  Filled 2022-11-02: qty 10, 10d supply, fill #0

## 2022-11-02 MED ORDER — ROPINIROLE HCL 1 MG PO TABS
1.0000 mg | ORAL_TABLET | Freq: Every day | ORAL | 0 refills | Status: DC
  Filled 2022-11-02: qty 90, 90d supply, fill #0

## 2022-11-02 MED ORDER — PANTOPRAZOLE SODIUM 40 MG PO TBEC
40.0000 mg | DELAYED_RELEASE_TABLET | Freq: Every day | ORAL | 0 refills | Status: DC
  Filled 2022-11-02: qty 90, 90d supply, fill #0

## 2022-11-03 ENCOUNTER — Other Ambulatory Visit: Payer: Self-pay

## 2022-11-21 ENCOUNTER — Other Ambulatory Visit (HOSPITAL_COMMUNITY): Payer: Self-pay

## 2022-12-09 DIAGNOSIS — I10 Essential (primary) hypertension: Secondary | ICD-10-CM | POA: Diagnosis not present

## 2022-12-09 DIAGNOSIS — B372 Candidiasis of skin and nail: Secondary | ICD-10-CM | POA: Diagnosis not present

## 2022-12-09 DIAGNOSIS — E669 Obesity, unspecified: Secondary | ICD-10-CM | POA: Diagnosis not present

## 2022-12-09 DIAGNOSIS — Z01419 Encounter for gynecological examination (general) (routine) without abnormal findings: Secondary | ICD-10-CM | POA: Diagnosis not present

## 2022-12-12 ENCOUNTER — Other Ambulatory Visit: Payer: Self-pay

## 2022-12-12 ENCOUNTER — Encounter (HOSPITAL_BASED_OUTPATIENT_CLINIC_OR_DEPARTMENT_OTHER): Payer: Self-pay | Admitting: Emergency Medicine

## 2022-12-12 ENCOUNTER — Telehealth: Payer: BLUE CROSS/BLUE SHIELD

## 2022-12-12 ENCOUNTER — Emergency Department (HOSPITAL_BASED_OUTPATIENT_CLINIC_OR_DEPARTMENT_OTHER): Payer: 59 | Admitting: Radiology

## 2022-12-12 ENCOUNTER — Emergency Department (HOSPITAL_BASED_OUTPATIENT_CLINIC_OR_DEPARTMENT_OTHER)
Admission: EM | Admit: 2022-12-12 | Discharge: 2022-12-12 | Disposition: A | Discharge status: Home / Self Care | Payer: 59 | Attending: Emergency Medicine | Admitting: Emergency Medicine

## 2022-12-12 DIAGNOSIS — R079 Chest pain, unspecified: Secondary | ICD-10-CM | POA: Diagnosis not present

## 2022-12-12 DIAGNOSIS — M25512 Pain in left shoulder: Secondary | ICD-10-CM | POA: Diagnosis present

## 2022-12-12 DIAGNOSIS — Z7982 Long term (current) use of aspirin: Secondary | ICD-10-CM | POA: Insufficient documentation

## 2022-12-12 DIAGNOSIS — R0789 Other chest pain: Secondary | ICD-10-CM | POA: Diagnosis not present

## 2022-12-12 LAB — BASIC METABOLIC PANEL
Anion gap: 7 (ref 5–15)
BUN: 17 mg/dL (ref 8–23)
CO2: 28 mmol/L (ref 22–32)
Calcium: 9 mg/dL (ref 8.9–10.3)
Chloride: 103 mmol/L (ref 98–111)
Creatinine, Ser: 0.91 mg/dL (ref 0.44–1.00)
GFR, Estimated: >60 mL/min (ref >60)
Glucose, Bld: 88 mg/dL (ref 70–99)
Potassium: 4 mmol/L (ref 3.5–5.1)
Sodium: 138 mmol/L (ref 135–145)

## 2022-12-12 LAB — TROPONIN I (HIGH SENSITIVITY)
Troponin I (High Sensitivity): 3 ng/L (ref <18)
Troponin I (High Sensitivity): 3 ng/L (ref <18)

## 2022-12-12 LAB — CBC
HCT: 36.9 % (ref 36.0–46.0)
Hemoglobin: 11.6 g/dL — ABNORMAL LOW (ref 12.0–15.0)
MCH: 28.6 pg (ref 26.0–34.0)
MCHC: 31.4 g/dL (ref 30.0–36.0)
MCV: 90.9 fL (ref 80.0–100.0)
Platelets: 235 10*3/uL (ref 150–400)
RBC: 4.06 MIL/uL (ref 3.87–5.11)
RDW: 14 % (ref 11.5–15.5)
WBC: 7 10*3/uL (ref 4.0–10.5)
nRBC: 0 % (ref 0.0–0.2)

## 2022-12-12 LAB — HEPATIC FUNCTION PANEL
ALT: 11 U/L (ref 0–44)
AST: 31 U/L (ref 15–41)
Albumin: 4.2 g/dL (ref 3.5–5.0)
Alkaline Phosphatase: 63 U/L (ref 38–126)
Bilirubin, Direct: 0.1 mg/dL (ref 0.0–0.2)
Indirect Bilirubin: 0.3 mg/dL (ref 0.3–0.9)
Total Bilirubin: 0.4 mg/dL (ref 0.3–1.2)
Total Protein: 7.1 g/dL (ref 6.5–8.1)

## 2022-12-12 LAB — LIPASE, BLOOD: Lipase: 19 U/L (ref 11–51)

## 2022-12-12 NOTE — Discharge Instructions (Signed)
I would recommend that you follow-up with a cardiologist given your family history of heart disease.  It may be reasonable to have a further workup performed as an outpatient.  If your symptoms return, specifically with worsening chest pain, lightheadedness, sweating, nausea, loss of consciousness, please return immediately to the ER.

## 2022-12-12 NOTE — ED Provider Notes (Signed)
[place] EMERGENCY DEPARTMENT AT [facility] Provider Note   CSN: [id number] Arrival date & time: 12/12/22  1726     History {Add pertinent medical, surgical, social history, OB history to HPI:1} Chief Complaint  Patient presents with   Chest Pain    Sheryl Cooper is a 62 y.o. female presented to ED complaining of sharp, intermittent pains in her left shoulder.  Patient reports she noticed this yesterday into today.  She has had several episodes of very brief, sporadic, sharp pain in her left shoulder, feels like "someone is stabbing me", and last only 1 to 2 seconds and then go away.  These episodes were not associated with exertion.  They occur at rest.  She denies shortness of breath, chest pressure, nausea or vomiting.  She reports a little bit of "brain fog".  She denies any history of MI or known coronary disease, or smoking, or diabetes.  She denies any hematuria, dysuria, or history of kidney stones.  She reports she had a gastric bypass years ago but no other abdominal surgeries.  She is currently chest pain-free, and says her last episode occurred around 430 pm today.  HPI     Home Medications Prior to Admission medications   Medication Sig Start Date End Date Taking? Authorizing Provider  acetaminophen (TYLENOL) 500 MG tablet Take 2 tablets (1,000 mg total) by mouth every 6 (six) hours as needed for mild pain or moderate pain. 11/24/20   Jenne Pane, PA-C  aspirin EC 81 MG tablet Take 1 tablet (81 mg total) by mouth 2 (two) times daily.  Take for 30 days post surgery for DVT prophylaxis 08/11/22   Nestor Ramp, MD  buPROPion [facility] SR) 150 MG 12 hr tablet TAKE 2 TABLETS BY MOUTH EVERY MORNING. Patient not taking: Reported on 04/30/2021 05/11/20 06/23/21  Nestor Ramp, MD  FLUoxetine (PROZAC) 20 MG capsule TAKE 3 CAPSULES BY MOUTH ONCE DAILY Patient not taking: Reported on 04/30/2021 03/25/21 03/25/22  Nestor Ramp, MD  hydrochlorothiazide (HYDRODIURIL) 25  MG tablet TAKE 1 TABLET BY MOUTH ONCE DAILY 08/11/22 08/11/23  Nestor Ramp, MD  hydrOXYzine (ATARAX/VISTARIL) 10 MG tablet TAKE 1 TABLET BY MOUTH 3 TIMES DAILY AS NEEDED Patient not taking: Reported on 04/30/2021 06/11/20 06/11/21  Nestor Ramp, MD  losartan (COZAAR) 50 MG tablet Take 1 tablet (50 mg total) by mouth daily. Need appointment for refills 11/02/22   Nestor Ramp, MD  methocarbamol (ROBAXIN) 500 MG tablet Take 1 tablet (500 mg total) by mouth every 8 (eight) hours as needed for muscle spasms. 11/24/20   Jenne Pane, PA-C  methocarbamol (ROBAXIN) 500 MG tablet Take 1 tablet by mouth every eight hours as needed for muscle spasms 01/18/21     methylphenidate (RITALIN) 20 MG tablet TAKE 3 TABLETS BY MOUTH IN THE MORNING 07/28/20 01/24/21  Nestor Ramp, MD  methylPREDNISolone (MEDROL) 4 MG TBPK tablet Take 6 tablets by mouth on day 1, then 5 tabs on day 2, then 4 tabs on day 3, then 3 tabs on day 4, then 2 tabs on day 5, then 1 tab on day 6 as directed. Patient not taking: Reported on 04/30/2021 04/09/21     Multiple Vitamins-Minerals (MULTIVITAL PO) Take by mouth.    [provider]  ondansetron (ZOFRAN) 4 MG tablet Take 1 tablet (4 mg total) by mouth daily as needed for nausea or vomiting. 11/02/22   Nestor Ramp, MD  oxyCODONE (OXY IR/ROXICODONE) 5  MG immediate release tablet Take 1 tablet by mouth every six to eight hours as needed for pain Patient not taking: Reported on 04/30/2021 12/17/20     oxyCODONE (ROXICODONE) 5 MG immediate release tablet Take 1 tablet (5 mg total) by mouth every 6 (six) hours as needed for severe pain. Patient not taking: Reported on 04/30/2021 11/24/20   Jenne Pane, PA-C  pantoprazole (PROTONIX) 40 MG tablet Take 1 tablet (40 mg total) by mouth daily. 11/02/22 11/02/23  Nestor Ramp, MD  rizatriptan (MAXALT) 10 MG tablet Take 1 tablet (10 mg total) by mouth daily. May repeat once in 2 hours if needed 09/09/22     rOPINIRole (REQUIP) 1 MG tablet Take 1 tablet (1 mg  total) by mouth at bedtime. Need appointment for refills 11/02/22   Nestor Ramp, MD  traMADol Janean Sark) 50 MG tablet Take 1 tablet by mouth every 8 hours as needed for pain 08/11/22   Nestor Ramp, MD      Allergies    Diflucan [fluconazole], Sulfa antibiotics, and Ciprofloxacin    Review of Systems   Review of Systems  Physical Exam Updated Vital Signs BP (!) 121/56 (BP Location: Right Arm)   Pulse 63   Temp 97.9 F (36.6 C) (Oral)   Resp 11   Ht 5\' 5"  (1.651 m)   Wt 91.6 kg   LMP 08/17/2012   SpO2 98%   BMI 33.61 kg/m  Physical Exam Constitutional:      General: She is not in acute distress. HENT:     Head: Normocephalic and atraumatic.  Eyes:     Conjunctiva/sclera: Conjunctivae normal.     Pupils: Pupils are equal, round, and reactive to light.  Cardiovascular:     Rate and Rhythm: Normal rate and regular rhythm.  Pulmonary:     Effort: Pulmonary effort is normal. No respiratory distress.  Abdominal:     General: There is no distension.     Tenderness: There is no abdominal tenderness.  Skin:    General: Skin is warm and dry.  Neurological:     General: No focal deficit present.     Mental Status: She is alert. Mental status is at baseline.  Psychiatric:        Mood and Affect: Mood normal.        Behavior: Behavior normal.     ED Results / Procedures / Treatments   Labs (all labs ordered are listed, but only abnormal results are displayed) Labs Reviewed  BASIC METABOLIC PANEL  CBC  LIPASE, BLOOD  HEPATIC FUNCTION PANEL  URINALYSIS, ROUTINE W REFLEX MICROSCOPIC  TROPONIN I (HIGH SENSITIVITY)    EKG None  Radiology No results found.  Procedures Procedures  {Document cardiac monitor, telemetry assessment procedure when appropriate:1}  Medications Ordered in ED Medications - No data to display  ED Course/ Medical Decision Making/ A&P   {   Click here for ABCD2, HEART and other calculatorsREFRESH Note before signing :1}                           Medical Decision Making Amount and/or Complexity of Data Reviewed Labs: ordered. Radiology: ordered.   This patient presents to the Emergency Department with complaint of chest pain. This involves an extensive number of treatment options, and is a complaint that carries with it a high risk of complications and morbidity. .The differential diagnosis includes ACS vs Pneumothorax vs Reflux/Gastritis vs MSK pain vs  Pneumonia vs other.  This may also be a muscle spasm or muscle pain  I felt PE was less likely given that pt has no acute PE risk factor, low well's score  -in her intermittent symptoms, with no respiratory complaint, lowers my suspicion for acute PE  I ordered, reviewed, and interpreted labs.  Pertinent results include *** Patient is not requiring any medications for pain at this time as she is asymptomatic. I ordered imaging studies which included x-ray of the chest I independently visualized and interpreted imaging which showed *** and the monitor tracing which showed *** . I agree with the radiologist interpretation  I personally reviewed the patients ECG which showed sinus rhythm with no acute ischemic findings  After the interventions stated above, I reevaluated the patient and found that they were ***  Based on the patient's clinical exam, vital signs, risk factors, and ED testing, I felt that the patient's overall risk of life-threatening emergency such as ACS, PE, sepsis, or infection was low.  At this time, I felt the patient's presentation was most clinically consistent with ***, but explained to the patient that this evaluation was not a definitive diagnostic workup.  I discussed outpatient follow up with primary care provider, and provided specialist office number on the patient's discharge paper if a referral was deemed necessary.  Return precautions were discussed with the patient.  I felt the patient was clinically stable for discharge.   {Document critical care  time when appropriate:1} {Document review of labs and clinical decision tools ie heart score, Chads2Vasc2 etc:1}  {Document your independent review of radiology images, and any outside records:1} {Document your discussion with family members, caretakers, and with consultants:1} {Document social determinants of health affecting pt's care:1} {Document your decision making why or why not admission, treatments were needed:1} Final Clinical Impression(s) / ED Diagnoses Final diagnoses:  None    Rx / DC Orders ED Discharge Orders     None

## 2022-12-12 NOTE — ED Triage Notes (Signed)
Pt arrives to ED with c/o left sided chest pain that described as intermittent stabbing sensation that last less than 5 minutes with radiation to her left arm that started yesterday.

## 2022-12-13 ENCOUNTER — Ambulatory Visit
Admission: RE | Admit: 2022-12-13 | Discharge: 2022-12-13 | Disposition: A | Discharge status: Home / Self Care | Payer: BLUE CROSS/BLUE SHIELD | Source: Ambulatory Visit | Attending: Family Medicine | Admitting: Family Medicine

## 2022-12-13 DIAGNOSIS — Z1231 Encounter for screening mammogram for malignant neoplasm of breast: Secondary | ICD-10-CM

## 2023-01-06 ENCOUNTER — Telehealth: Payer: BLUE CROSS/BLUE SHIELD | Admitting: Nurse Practitioner

## 2023-01-06 DIAGNOSIS — N898 Other specified noninflammatory disorders of vagina: Secondary | ICD-10-CM

## 2023-01-06 NOTE — Progress Notes (Signed)
Sheryl Cooper,  Since you are allergic to Diflucan and have already tried topical creams we would suggest a follow up with your primary care or OBGYN for exam to assure what the best treatment is next.    I feel your condition warrants further evaluation and I recommend that you be seen for a face to face visit.  Please contact your primary care physician practice to be seen. Many offices offer virtual options to be seen via video if you are not comfortable going in person to a medical facility at this time.  NOTE: You will NOT be charged for this eVisit.  If you do not have a PCP, [facility] offers a free physician referral service available at [phone]. Our trained staff has the experience, knowledge and resources to put you in touch with a physician who is right for you.    If you are having a true medical emergency please call 911.   Your e-visit answers were reviewed by a board certified advanced clinical practitioner to complete your personal care plan.  Thank you for using e-Visits.

## 2023-02-06 NOTE — Progress Notes (Deleted)
  Cardiology Office Note:   Date:  02/06/2023  ID:  Sheryl Cooper, DOB 12/23/1960, MRN [id number]  History of Present Illness:   Sheryl SASALA is a 62 y.o. female with history of anxiety, depression, obesity s/p gastric sleeve placement and GERD who was referred by Dr. Jennette Kettle for further evaluation of chest pain.   Was seen in the ER on 12/12/22 for atypical chest pain. Notes reviewed. ECG showed NSR, no ischemic changes. Trop negative x2. CXR without acute pathology. Given reassuring work-up, she was discharged home with CV follow-up.  Today, ***  Past Medical History:  Diagnosis Date   Anxiety    Arthritis    Closed right ankle fracture    Depression    Foot fracture, left    GERD (gastroesophageal reflux disease)    History of chest pain    History of panic attacks    History of syncope    2012  though possible seizure,  per pt not seizure and no episode since   Hypertension    followed by pcp   (pt had cardiac cath @[facility]   normal coroniars and normal lvsf   Insomnia    Migraines    Restless leg syndrome    S/P gastric sleeve procedure 06/25/2018   Wears glasses      ROS: ***  Studies Reviewed:    EKG:  ***       Risk Assessment/Calculations:   {Does this patient have ATRIAL FIBRILLATION?:[phone]} No BP recorded.  {Refresh Note OR Click here to enter BP  :1}***        Physical Exam:   VS:  LMP 08/17/2012    Wt Readings from Last 3 Encounters:  12/12/22 202 lb (91.6 kg)  05/14/21 227 lb (103 kg)  11/24/20 212 lb (96.2 kg)     GEN: Well nourished, well developed in no acute distress NECK: No JVD; No carotid bruits CARDIAC: ***RRR, no murmurs, rubs, gallops RESPIRATORY:  Clear to auscultation without rales, wheezing or rhonchi  ABDOMEN: Soft, non-tender, non-distended EXTREMITIES:  No edema; No deformity   ASSESSMENT AND PLAN:   #Chest Pain: -Atypical and likely noncardiac -Check ***  #HTN: -Continue hydrochlorothiazide 25mg  daily -Continue  losartan 50mg  daily    {Are you ordering a CV Procedure (e.g. stress test, cath, DCCV, TEE, etc)?   Press F2        :[id number]}   Signed, Meriam Sprague, MD

## 2023-02-07 ENCOUNTER — Ambulatory Visit: Payer: BLUE CROSS/BLUE SHIELD | Attending: Cardiology | Admitting: Cardiology

## 2023-02-08 ENCOUNTER — Encounter: Payer: Self-pay | Admitting: Cardiology

## 2023-02-08 ENCOUNTER — Other Ambulatory Visit: Payer: Self-pay | Admitting: Family Medicine

## 2023-02-08 ENCOUNTER — Other Ambulatory Visit (HOSPITAL_COMMUNITY): Payer: Self-pay

## 2023-02-08 ENCOUNTER — Other Ambulatory Visit: Payer: Self-pay | Admitting: Oncology

## 2023-02-08 DIAGNOSIS — K8070 Calculus of gallbladder and bile duct without cholecystitis without obstruction: Secondary | ICD-10-CM

## 2023-02-08 DIAGNOSIS — R140 Abdominal distension (gaseous): Secondary | ICD-10-CM

## 2023-02-08 DIAGNOSIS — Z006 Encounter for examination for normal comparison and control in clinical research program: Secondary | ICD-10-CM

## 2023-02-08 DIAGNOSIS — R109 Unspecified abdominal pain: Secondary | ICD-10-CM

## 2023-02-08 DIAGNOSIS — R1013 Epigastric pain: Secondary | ICD-10-CM

## 2023-02-08 MED ORDER — PANTOPRAZOLE SODIUM 40 MG PO TBEC
40.0000 mg | DELAYED_RELEASE_TABLET | Freq: Every day | ORAL | 0 refills | Status: DC
Start: 2023-02-08 — End: 2023-02-10
  Filled 2023-02-08: qty 90, 90d supply, fill #0

## 2023-02-09 ENCOUNTER — Other Ambulatory Visit (HOSPITAL_COMMUNITY): Payer: Self-pay

## 2023-02-09 ENCOUNTER — Other Ambulatory Visit: Payer: Self-pay

## 2023-02-10 ENCOUNTER — Other Ambulatory Visit (HOSPITAL_COMMUNITY): Payer: Self-pay

## 2023-02-10 MED ORDER — LOSARTAN POTASSIUM 50 MG PO TABS
50.0000 mg | ORAL_TABLET | Freq: Every day | ORAL | 1 refills | Status: DC
  Filled 2023-02-10: qty 90, 90d supply, fill #0
  Filled 2023-05-05: qty 90, 90d supply, fill #1

## 2023-02-10 MED ORDER — HYDROCHLOROTHIAZIDE 25 MG PO TABS
25.0000 mg | ORAL_TABLET | Freq: Every morning | ORAL | 1 refills | Status: DC
  Filled 2023-02-10: qty 90, 90d supply, fill #0
  Filled 2023-05-05: qty 90, 90d supply, fill #1

## 2023-02-10 MED ORDER — ONDANSETRON HCL 4 MG PO TABS: 4.0000 mg | ORAL_TABLET | Freq: Every day | ORAL | 0 refills | Status: AC

## 2023-02-10 MED ORDER — PANTOPRAZOLE SODIUM 40 MG PO TBEC
40.0000 mg | DELAYED_RELEASE_TABLET | Freq: Every day | ORAL | 1 refills | Status: DC
  Filled 2023-02-10 – 2023-05-05 (×2): qty 90, 90d supply, fill #0
  Filled 2023-08-15: qty 90, 90d supply, fill #1

## 2023-02-10 MED ORDER — ROPINIROLE HCL 1 MG PO TABS
1.0000 mg | ORAL_TABLET | Freq: Every day | ORAL | 1 refills | Status: DC
  Filled 2023-02-10: qty 90, 90d supply, fill #0
  Filled 2023-05-05: qty 90, 90d supply, fill #1

## 2023-02-11 ENCOUNTER — Other Ambulatory Visit (HOSPITAL_COMMUNITY): Payer: Self-pay

## 2023-02-13 ENCOUNTER — Other Ambulatory Visit: Payer: Self-pay

## 2023-02-13 ENCOUNTER — Other Ambulatory Visit (HOSPITAL_COMMUNITY): Payer: Self-pay

## 2023-02-13 MED ORDER — ONDANSETRON HCL 4 MG PO TABS
4.0000 mg | ORAL_TABLET | Freq: Every day | ORAL | 0 refills | Status: DC | PRN
  Filled 2023-02-13 – 2023-02-17 (×2): qty 10, 10d supply, fill #0

## 2023-02-16 ENCOUNTER — Other Ambulatory Visit: Payer: Self-pay

## 2023-02-16 ENCOUNTER — Other Ambulatory Visit (HOSPITAL_COMMUNITY): Payer: Self-pay

## 2023-02-16 DIAGNOSIS — B372 Candidiasis of skin and nail: Secondary | ICD-10-CM | POA: Diagnosis not present

## 2023-02-16 DIAGNOSIS — K219 Gastro-esophageal reflux disease without esophagitis: Secondary | ICD-10-CM | POA: Diagnosis not present

## 2023-02-16 DIAGNOSIS — I10 Essential (primary) hypertension: Secondary | ICD-10-CM | POA: Diagnosis not present

## 2023-02-16 DIAGNOSIS — M1990 Unspecified osteoarthritis, unspecified site: Secondary | ICD-10-CM | POA: Diagnosis not present

## 2023-02-16 DIAGNOSIS — R110 Nausea: Secondary | ICD-10-CM | POA: Diagnosis not present

## 2023-02-16 DIAGNOSIS — E669 Obesity, unspecified: Secondary | ICD-10-CM | POA: Diagnosis not present

## 2023-02-16 DIAGNOSIS — G4709 Other insomnia: Secondary | ICD-10-CM | POA: Diagnosis not present

## 2023-02-16 DIAGNOSIS — G2581 Restless legs syndrome: Secondary | ICD-10-CM | POA: Diagnosis not present

## 2023-02-16 MED ORDER — FLUCONAZOLE 150 MG PO TABS
150.0000 mg | ORAL_TABLET | ORAL | 0 refills | Status: DC
  Filled 2023-02-16 – 2023-02-17 (×2): qty 4, 28d supply, fill #0

## 2023-02-17 ENCOUNTER — Other Ambulatory Visit (HOSPITAL_COMMUNITY): Payer: Self-pay

## 2023-02-17 ENCOUNTER — Other Ambulatory Visit: Payer: Self-pay

## 2023-02-17 ENCOUNTER — Other Ambulatory Visit (HOSPITAL_BASED_OUTPATIENT_CLINIC_OR_DEPARTMENT_OTHER): Payer: Self-pay

## 2023-02-17 MED ORDER — TRAMADOL HCL 50 MG PO TABS
50.0000 mg | ORAL_TABLET | ORAL | 0 refills | Status: DC
  Filled 2023-02-17: qty 15, 84d supply, fill #0
  Filled 2023-05-05: qty 15, 60d supply, fill #0

## 2023-02-20 ENCOUNTER — Other Ambulatory Visit (HOSPITAL_COMMUNITY): Payer: Self-pay

## 2023-02-27 ENCOUNTER — Other Ambulatory Visit (HOSPITAL_COMMUNITY): Payer: Self-pay

## 2023-03-02 ENCOUNTER — Other Ambulatory Visit (HOSPITAL_COMMUNITY): Payer: Self-pay

## 2023-04-07 ENCOUNTER — Ambulatory Visit: Payer: BLUE CROSS/BLUE SHIELD | Attending: Cardiovascular Disease | Admitting: Cardiovascular Disease

## 2023-05-05 ENCOUNTER — Other Ambulatory Visit (HOSPITAL_COMMUNITY): Payer: Self-pay

## 2023-05-05 ENCOUNTER — Other Ambulatory Visit: Payer: Self-pay | Admitting: Family Medicine

## 2023-05-05 ENCOUNTER — Other Ambulatory Visit: Payer: Self-pay

## 2023-05-05 ENCOUNTER — Encounter (INDEPENDENT_AMBULATORY_CARE_PROVIDER_SITE_OTHER): Payer: Self-pay

## 2023-05-05 MED ORDER — HYDROCHLOROTHIAZIDE 25 MG PO TABS
25.0000 mg | ORAL_TABLET | Freq: Every day | ORAL | 1 refills | Status: DC
  Filled 2023-05-05 – 2023-08-15 (×2): qty 90, 90d supply, fill #0

## 2023-05-25 ENCOUNTER — Other Ambulatory Visit (HOSPITAL_COMMUNITY): Payer: BLUE CROSS/BLUE SHIELD | Attending: Oncology

## 2023-08-15 ENCOUNTER — Other Ambulatory Visit: Payer: Self-pay

## 2023-08-15 ENCOUNTER — Other Ambulatory Visit (HOSPITAL_COMMUNITY): Payer: Self-pay

## 2023-08-16 ENCOUNTER — Other Ambulatory Visit: Payer: Self-pay

## 2023-08-16 ENCOUNTER — Other Ambulatory Visit (HOSPITAL_COMMUNITY): Payer: Self-pay

## 2023-08-16 MED ORDER — ROPINIROLE HCL 1 MG PO TABS
1.0000 mg | ORAL_TABLET | Freq: Every day | ORAL | 0 refills | Status: DC
  Filled 2023-08-16: qty 90, 90d supply, fill #0

## 2023-08-16 MED ORDER — HYDROCHLOROTHIAZIDE 25 MG PO TABS
25.0000 mg | ORAL_TABLET | Freq: Every morning | ORAL | 0 refills | Status: DC
  Filled 2023-08-16 – 2023-11-17 (×2): qty 90, 90d supply, fill #0

## 2023-08-16 MED ORDER — LOSARTAN POTASSIUM 50 MG PO TABS
50.0000 mg | ORAL_TABLET | Freq: Every day | ORAL | 0 refills | Status: DC
  Filled 2023-08-16: qty 90, 90d supply, fill #0

## 2023-08-17 ENCOUNTER — Other Ambulatory Visit: Payer: Self-pay

## 2023-08-17 ENCOUNTER — Other Ambulatory Visit (HOSPITAL_COMMUNITY): Payer: Self-pay

## 2023-09-08 ENCOUNTER — Ambulatory Visit: Payer: Medicare (Managed Care) | Admitting: Family Medicine

## 2023-10-13 ENCOUNTER — Encounter: Payer: Self-pay | Admitting: Family Medicine

## 2023-10-17 ENCOUNTER — Ambulatory Visit (INDEPENDENT_AMBULATORY_CARE_PROVIDER_SITE_OTHER): Payer: Medicare (Managed Care) | Admitting: Family Medicine

## 2023-10-17 ENCOUNTER — Other Ambulatory Visit: Payer: Self-pay

## 2023-10-17 ENCOUNTER — Other Ambulatory Visit (HOSPITAL_COMMUNITY): Payer: Self-pay

## 2023-10-17 ENCOUNTER — Encounter: Payer: Self-pay | Admitting: Family Medicine

## 2023-10-17 VITALS — BP 118/66 | HR 61 | Temp 97.6°F | Ht 65.0 in | Wt 211.4 lb

## 2023-10-17 DIAGNOSIS — Z23 Encounter for immunization: Secondary | ICD-10-CM

## 2023-10-17 DIAGNOSIS — Z13820 Encounter for screening for osteoporosis: Secondary | ICD-10-CM | POA: Diagnosis not present

## 2023-10-17 DIAGNOSIS — G2581 Restless legs syndrome: Secondary | ICD-10-CM

## 2023-10-17 DIAGNOSIS — Z780 Asymptomatic menopausal state: Secondary | ICD-10-CM

## 2023-10-17 DIAGNOSIS — Z1211 Encounter for screening for malignant neoplasm of colon: Secondary | ICD-10-CM

## 2023-10-17 DIAGNOSIS — I10 Essential (primary) hypertension: Secondary | ICD-10-CM | POA: Diagnosis not present

## 2023-10-17 DIAGNOSIS — Z9884 Bariatric surgery status: Secondary | ICD-10-CM

## 2023-10-17 DIAGNOSIS — M150 Primary generalized (osteo)arthritis: Secondary | ICD-10-CM

## 2023-10-17 DIAGNOSIS — M159 Polyosteoarthritis, unspecified: Secondary | ICD-10-CM | POA: Insufficient documentation

## 2023-10-17 MED ORDER — TRAMADOL HCL 50 MG PO TABS
50.0000 mg | ORAL_TABLET | ORAL | 0 refills | Status: DC
Start: 2023-10-17 — End: 2024-02-28
  Filled 2023-10-17: qty 30, 210d supply, fill #0

## 2023-10-17 NOTE — Assessment & Plan Note (Signed)
 I will continue intermittent tramadol. We discussed cautious use of this when Tylenol or intermittent NSAIDs have not helped.

## 2023-10-17 NOTE — Assessment & Plan Note (Signed)
 Advised Sheryl Cooper to be taking a daily multivitamin to prevent deficiencies.

## 2023-10-17 NOTE — Assessment & Plan Note (Signed)
 Stable. Continue ropinirole 1 mg at bedtime.

## 2023-10-17 NOTE — Progress Notes (Signed)
 [facility] [address] [place] [address] Dept: [phone] Dept Fax: [phone]  New Patient Office Visit  Subjective:    Patient ID: Sheryl Cooper, female    DOB: 03/27/1961, 63 y.o..   MRN: [id number]  Chief Complaint  Patient presents with   Establish Care    NP- establish care.   No concerns.     History of Present Illness:  Patient is in today to establish care. Sheryl Cooper was born in [place], [place]. She moved to [facility] soon after high school, having gotten married. She had two children: a daughter (59) and a son (65) with her first husband. They divorced and she has been married twice more since then. She is currently separated and single. She has two children by her 3rd husband (83, 22) that are sons and live with her. She has one grandson (11). Sheryl Cooper did attend [employer] later in life and received an AA in Medical Office Business Administration. 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 She denies use of tobacco or drugs. She does drink alcohol intermittently.  Sheryl Cooper has a history of morbid obesity. Her maximum weight was ~ 397 lbs. She underwent a gastric sleeve procedure. She notes she still works towards additional weight loss. She gets occasional nausea, for which she uses ondansetron. She also was advised to remain on a PPI, so she takes pantoprazole daily.  Sheryl Cooper has a history of essential hypertension. She is managed on HCTZ 25 mg daily and losartan 50 mg daily.  Sheryl Cooper has a history of restless legs. She is managed on ropinirole 1 mg at bedtime. She notes this works very well for her.  Sheryl Cooper has multiple joint aches, esp. in her back, shoulders, and knees. She uses intermittent tramadol for pain management.  Past Medical History: Patient Active Problem List   Diagnosis Date Noted   Osteoarthritis of multiple joints 10/17/2023   History  of bariatric surgery 10/19/2021   Gait abnormality 01/08/2020   Anisocoria 01/08/2020   Gastro-esophageal reflux disease without esophagitis 06/20/2015   Anxiety and depression 07/08/2014   Chronic post-traumatic stress disorder (PTSD) 06/13/2014   Restless leg syndrome 05/28/2013   Insomnia 05/28/2013   Essential hypertension 09/24/2010   History of panic attacks 02/24/2010   DJD (degenerative joint disease) of knee 02/24/2010   Past Surgical History:  Procedure Laterality Date   CARDIAC CATHETERIZATION  06-22-2015  @[facility]    for chest pain/ precordial pain----  normal coronaries with lvsf 50%   COLONOSCOPY  07/2013   WITH POLYP REMOVAL   FRACTURE SURGERY     HYSTEROSCOPY W/ ENDOMETRIAL ABLATION  03-23-2010  @[facility]    LAPAROSCOPIC GASTRIC SLEEVE RESECTION N/A 06/25/2018   Procedure: LAPAROSCOPIC GASTRIC SLEEVE RESECTION, UPPER ENDO, ERAS PATHWAY;  Surgeon: Glenna Fellows, MD;  Location: [facility];  Service: General;  Laterality: N/A;   ORIF ANKLE FRACTURE Right 11/24/2020   Procedure: OPEN REDUCTION INTERNAL FIXATION (ORIF) ANKLE FRACTURE;  Surgeon: Sheral Apley, MD;  Location: [facility];  Service: Orthopedics;  Laterality: Right;   POLYPECTOMY     TONSILLECTOMY  1976   TUBAL LIGATION     Family History  Problem Relation Age of Onset   Cancer Mother        breast (74), cervical (76), skin (71)   Diabetes Mother    Heart disease Mother    Hypertension Mother    Arthritis Mother  Obesity Mother    Stroke Mother    Varicose Veins Mother    Heart disease Father    Diabetes Father    Diabetes Maternal Aunt    Stroke Maternal Grandmother    Heart disease Maternal Grandmother    Heart disease Maternal Grandfather    Colon cancer Neg Hx    Esophageal cancer Neg Hx    Stomach cancer Neg Hx    Rectal cancer Neg Hx    Colon polyps Neg Hx    Outpatient Medications Prior to Visit  Medication Sig Dispense Refill   aspirin EC 81 MG tablet Take 1 tablet (81 mg  total) by mouth 2 (two) times daily.  Take for 30 days post surgery for DVT prophylaxis 60 tablet 0   cyanocobalamin (VITAMIN B12) 1000 MCG tablet Take 1,000 mcg by mouth daily.     hydrochlorothiazide (HYDRODIURIL) 25 MG tablet Take 1 tablet (25 mg total) by mouth in the morning. 90 tablet 0   losartan (COZAAR) 50 MG tablet Take 1 tablet (50 mg total) by mouth daily. 90 tablet 0   ondansetron (ZOFRAN) 4 MG tablet Take 1 tablet (4 mg total) by mouth daily as needed for nausea 10 tablet 0   pantoprazole (PROTONIX) 40 MG tablet Take 1 tablet (40 mg total) by mouth daily. 90 tablet 1   rOPINIRole (REQUIP) 1 MG tablet Take 1 tablet (1 mg total) by mouth at bedtime 1-3 hours before bedtime 90 tablet 0   traMADol (ULTRAM) 50 MG tablet Take 1 tablet (50 mg) by mouth once as needed. average once per week, as needed. 15 tablet 0   acetaminophen (TYLENOL) 500 MG tablet Take 2 tablets (1,000 mg total) by mouth every 6 (six) hours as needed for mild pain or moderate pain. 80 tablet 0   buPROPion (WELLBUTRIN SR) 150 MG 12 hr tablet TAKE 2 TABLETS BY MOUTH EVERY MORNING. (Patient not taking: Reported on 04/30/2021) 180 tablet 2   fluconazole (DIFLUCAN) 150 MG tablet Take 1 tablet (150 mg total) by mouth once a week. 4 tablet 0   FLUoxetine (PROZAC) 20 MG capsule TAKE 3 CAPSULES BY MOUTH ONCE DAILY (Patient not taking: Reported on 04/30/2021) 270 capsule 3   hydrochlorothiazide (HYDRODIURIL) 25 MG tablet Take 1 tablet (25 mg total) by mouth daily. 90 tablet 1   hydrOXYzine (ATARAX/VISTARIL) 10 MG tablet TAKE 1 TABLET BY MOUTH 3 TIMES DAILY AS NEEDED (Patient not taking: Reported on 04/30/2021) 90 tablet 1   losartan (COZAAR) 50 MG tablet Take 1 tablet (50 mg total) by mouth daily. Need appointment for refills 90 tablet 0   methocarbamol (ROBAXIN) 500 MG tablet Take 1 tablet (500 mg total) by mouth every 8 (eight) hours as needed for muscle spasms. 20 tablet 0   methocarbamol (ROBAXIN) 500 MG tablet Take 1 tablet by  mouth every eight hours as needed for muscle spasms 15 tablet 0   methylphenidate (RITALIN) 20 MG tablet TAKE 3 TABLETS BY MOUTH IN THE MORNING 90 tablet 0   methylPREDNISolone (MEDROL) 4 MG TBPK tablet Take 6 tablets by mouth on day 1, then 5 tabs on day 2, then 4 tabs on day 3, then 3 tabs on day 4, then 2 tabs on day 5, then 1 tab on day 6 as directed. (Patient not taking: Reported on 04/30/2021) 21 tablet 0   Multiple Vitamins-Minerals (MULTIVITAL PO) Take by mouth.     ondansetron (ZOFRAN) 4 MG tablet Take 1 tablet (4 mg total) by mouth daily  as needed for nausea or vomiting. 10 tablet 0   oxyCODONE (OXY IR/ROXICODONE) 5 MG immediate release tablet Take 1 tablet by mouth every six to eight hours as needed for pain (Patient not taking: Reported on 04/30/2021) 20 tablet 0   oxyCODONE (ROXICODONE) 5 MG immediate release tablet Take 1 tablet (5 mg total) by mouth every 6 (six) hours as needed for severe pain. (Patient not taking: Reported on 04/30/2021) 28 tablet 0   rizatriptan (MAXALT) 10 MG tablet Take 1 tablet (10 mg total) by mouth daily. May repeat once in 2 hours if needed 30 tablet 3   rOPINIRole (REQUIP) 1 MG tablet Take 1 tablet (1 mg total) by mouth at bedtime. Need appointment for refills 90 tablet 0   traMADol (ULTRAM) 50 MG tablet Take 1 tablet by mouth every 8 hours as needed for pain 90 tablet 3   No facility-administered medications prior to visit.   Allergies  Allergen Reactions   Diflucan [Fluconazole] Itching   Sulfa Antibiotics Hives   Ciprofloxacin Rash   Objective:   Today's Vitals   10/17/23 1513  BP: 118/66  Pulse: 61  Temp: 97.6 F (36.4 C)  TempSrc: Temporal  SpO2: 97%  Weight: 211 lb 6.4 oz (95.9 kg)  Height: 5\' 5"  (1.651 m)   Body mass index is 35.18 kg/m.   General: Well developed, well nourished. No acute distress. Psych: Alert and oriented. Normal mood and affect.  Health Maintenance Due  Topic Date Due   Medicare Annual Wellness (AWV)  Never done    Zoster Vaccines- Shingrix (1 of 2) Never done   Cervical Cancer Screening (HPV/Pap Cotest)  11/21/2015   Colonoscopy  07/16/2018   COVID-19 Vaccine (3 - 2024-25 season) 04/02/2023   DEXA SCAN  Never done     Assessment & Plan:   Problem List Items Addressed This Visit       Cardiovascular and Mediastinum   Essential hypertension   Blood pressure is in good control. Continue HCTZ 25 mg daily and losartan 50 mg daily.        Musculoskeletal and Integument   Osteoarthritis of multiple joints - Primary   I will continue intermittent tramadol. We discussed cautious use of this when Tylenol or intermittent NSAIDs have not helped.      Relevant Medications   traMADol (ULTRAM) 50 MG tablet     Other   History of bariatric surgery   Advised Ms. Biederman to be taking a daily multivitamin to prevent deficiencies.      Restless leg syndrome   Stable. Continue ropinirole 1 mg at bedtime.      Other Visit Diagnoses       Need for pneumococcal 20-valent conjugate vaccination       Relevant Orders   Pneumococcal conjugate vaccine 20-valent (Completed)     Screening for colon cancer       Relevant Orders   Ambulatory referral to Gastroenterology     Screening for osteoporosis       Relevant Orders   DG Bone Density     Postmenopausal       Relevant Orders   DG Bone Density       Return in about 3 months (around 01/17/2024) for Reassessment.   Loyola Mast, MD

## 2023-10-17 NOTE — Assessment & Plan Note (Signed)
 Blood pressure is in good control. Continue HCTZ 25 mg daily and losartan 50 mg daily.

## 2023-11-17 ENCOUNTER — Other Ambulatory Visit (HOSPITAL_COMMUNITY): Payer: Self-pay

## 2023-11-17 ENCOUNTER — Other Ambulatory Visit: Payer: Self-pay

## 2023-11-20 ENCOUNTER — Other Ambulatory Visit (HOSPITAL_COMMUNITY): Payer: Self-pay

## 2023-11-20 MED ORDER — LOSARTAN POTASSIUM 50 MG PO TABS
50.0000 mg | ORAL_TABLET | Freq: Every day | ORAL | 0 refills | Status: DC
  Filled 2023-11-20: qty 30, 30d supply, fill #0

## 2023-11-20 MED ORDER — PANTOPRAZOLE SODIUM 40 MG PO TBEC
40.0000 mg | DELAYED_RELEASE_TABLET | Freq: Every day | ORAL | 0 refills | Status: DC
  Filled 2023-11-20: qty 30, 30d supply, fill #0

## 2023-11-20 MED ORDER — ROPINIROLE HCL 1 MG PO TABS
1.0000 mg | ORAL_TABLET | Freq: Every day | ORAL | 0 refills | Status: DC
  Filled 2023-11-20: qty 30, 30d supply, fill #0

## 2023-11-21 ENCOUNTER — Other Ambulatory Visit (HOSPITAL_COMMUNITY): Payer: Self-pay

## 2023-12-01 ENCOUNTER — Telehealth: Payer: Medicare (Managed Care)

## 2023-12-11 ENCOUNTER — Other Ambulatory Visit (HOSPITAL_COMMUNITY): Payer: Self-pay

## 2023-12-17 ENCOUNTER — Other Ambulatory Visit: Payer: Self-pay

## 2023-12-17 ENCOUNTER — Emergency Department (HOSPITAL_BASED_OUTPATIENT_CLINIC_OR_DEPARTMENT_OTHER): Payer: Medicare (Managed Care) | Admitting: Radiology

## 2023-12-17 DIAGNOSIS — M25512 Pain in left shoulder: Secondary | ICD-10-CM | POA: Diagnosis present

## 2023-12-17 NOTE — ED Triage Notes (Addendum)
 Pt POV reporting L shoulder pain x3 months, hx arthritis. Limited ROM due to pain. Denies trauma/injury.

## 2023-12-18 ENCOUNTER — Other Ambulatory Visit (HOSPITAL_COMMUNITY): Payer: Self-pay

## 2023-12-18 ENCOUNTER — Emergency Department (HOSPITAL_BASED_OUTPATIENT_CLINIC_OR_DEPARTMENT_OTHER)
Admission: EM | Admit: 2023-12-18 | Discharge: 2023-12-18 | Disposition: A | Discharge status: Home / Self Care | Payer: Medicare (Managed Care) | Attending: Emergency Medicine | Admitting: Emergency Medicine

## 2023-12-18 ENCOUNTER — Other Ambulatory Visit: Payer: Self-pay

## 2023-12-18 DIAGNOSIS — M25512 Pain in left shoulder: Secondary | ICD-10-CM

## 2023-12-18 MED ORDER — ACETAMINOPHEN 500 MG PO TABS
1000.0000 mg | ORAL_TABLET | Freq: Once | ORAL | Status: AC
  Administered 2023-12-18: 1000 mg via ORAL
  Filled 2023-12-18: qty 2

## 2023-12-18 MED ORDER — ACETAMINOPHEN 500 MG PO TABS
1000.0000 mg | ORAL_TABLET | Freq: Four times a day (QID) | ORAL | 0 refills | Status: AC | PRN
  Filled 2023-12-18: qty 30, 4d supply, fill #0

## 2023-12-18 MED ORDER — KETOROLAC TROMETHAMINE 15 MG/ML IJ SOLN
15.0000 mg | Freq: Once | INTRAMUSCULAR | Status: AC
  Administered 2023-12-18: 15 mg via INTRAMUSCULAR
  Filled 2023-12-18: qty 1

## 2023-12-18 MED ORDER — CELECOXIB 200 MG PO CAPS
200.0000 mg | ORAL_CAPSULE | Freq: Two times a day (BID) | ORAL | 0 refills | Status: AC
  Filled 2023-12-18: qty 20, 10d supply, fill #0

## 2023-12-18 NOTE — ED Provider Notes (Signed)
 [place] EMERGENCY DEPARTMENT AT [facility] Provider Note   CSN: [id number] Arrival date & time: 12/17/23  2134     History Chief Complaint  Patient presents with   Shoulder Pain    HPI Sheryl Cooper is a 63 y.o. female presenting for left shoulder pain acutely worsened today after reaching for something. States that she has severe arthritis. Denies FCNVSsob Tried OTC medications.  Patient's recorded medical, surgical, social, medication list and allergies were reviewed in the Snapshot window as part of the initial history.   Review of Systems   Review of Systems  Constitutional:  Negative for chills and fever.  HENT:  Negative for ear pain and sore throat.   Eyes:  Negative for pain and visual disturbance.  Respiratory:  Negative for cough and shortness of breath.   Cardiovascular:  Negative for chest pain and palpitations.  Gastrointestinal:  Negative for abdominal pain and vomiting.  Genitourinary:  Negative for dysuria and hematuria.  Musculoskeletal:  Positive for arthralgias. Negative for back pain.  Skin:  Negative for color change and rash.  Neurological:  Negative for seizures and syncope.  All other systems reviewed and are negative.   Physical Exam Updated Vital Signs BP (!) 128/49 (BP Location: Right Arm)   Pulse 72   Temp 98 F (36.7 C) (Oral)   Resp 16   Ht 5\' 5"  (1.651 m)   Wt 91.6 kg   LMP 08/17/2012   SpO2 99%   BMI 33.61 kg/m  Physical Exam Vitals and nursing note reviewed.  Constitutional:      General: She is not in acute distress.    Appearance: She is well-developed.  HENT:     Head: Normocephalic and atraumatic.  Eyes:     Conjunctiva/sclera: Conjunctivae normal.  Cardiovascular:     Rate and Rhythm: Normal rate and regular rhythm.     Heart sounds: No murmur heard. Pulmonary:     Effort: Pulmonary effort is normal. No respiratory distress.     Breath sounds: Normal breath sounds.  Abdominal:     General: There  is no distension.     Palpations: Abdomen is soft.     Tenderness: There is no abdominal tenderness. There is no right CVA tenderness or left CVA tenderness.  Musculoskeletal:        General: No swelling, tenderness or signs of injury. Normal range of motion.     Cervical back: Neck supple.  Skin:    General: Skin is warm and dry.  Neurological:     General: No focal deficit present.     Mental Status: She is alert and oriented to person, place, and time. Mental status is at baseline.     Cranial Nerves: No cranial nerve deficit.      ED Course/ Medical Decision Making/ A&P    Procedures Procedures   Medications Ordered in ED Medications  ketorolac  (TORADOL ) 15 MG/ML injection 15 mg (15 mg Intramuscular Given 12/18/23 0137)  acetaminophen  (TYLENOL ) tablet 1,000 mg (1,000 mg Oral Given 12/18/23 0137)    Medical Decision Making:   Sheryl Cooper is a 63 y.o. female who presented to the ED today with left shoulder pain detailed above.    Complete initial physical exam performed, notably the patient  was HDS in NAD.    Reviewed and confirmed nursing documentation for past medical history, family history, social history.    Initial Assessment:   With the patient's presentation of left shoulder pain, most likely diagnosis is  MSK etiology. Other diagnoses were considered including (but not limited to) tendititis. These are considered less likely due to history of present illness and physical exam findings.   This is most consistent with an acute life/limb threatening illness complicated by underlying chronic conditions.  Initial Plan:  XR to eval for fracture Objective evaluation as below reviewed   Initial Study Results:   Radiology:  All images reviewed independently. Agree with radiology report at this time.   DG Shoulder Left Result Date: 12/17/2023 CLINICAL DATA:  Left shoulder pain EXAM: LEFT SHOULDER - 2+ VIEW COMPARISON:  None Available. FINDINGS: Degenerative changes  in the glenohumeral joint with joint space narrowing and spurring. No acute bony abnormality. Specifically, no fracture, subluxation, or dislocation. IMPRESSION: Degenerative changes.  No acute bony abnormality. Electronically Signed   By: Janeece Mechanic M.D.   On: 12/17/2023 22:48     Reassessment and Plan:   No evidence of orthopedic fracture.  Discussed supportive care, follow-up with orthopedics given degree of degenerative changes identified on x-ray.  Patient in agreement.   Disposition:  I have considered need for hospitalization, however, considering all of the above, I believe this patient is stable for discharge at this time.  Patient/family educated about specific return precautions for given chief complaint and symptoms.  Patient/family educated about follow-up with PCP and orthopedics.     Patient/family expressed understanding of return precautions and need for follow-up. Patient spoken to regarding all imaging and laboratory results and appropriate follow up for these results. All education provided in verbal form with additional information in written form. Time was allowed for answering of patient questions. Patient discharged.    Emergency Department Medication Summary:   Medications  ketorolac  (TORADOL ) 15 MG/ML injection 15 mg (15 mg Intramuscular Given 12/18/23 0137)  acetaminophen  (TYLENOL ) tablet 1,000 mg (1,000 mg Oral Given 12/18/23 0137)         Clinical Impression:  1. Acute pain of left shoulder      Discharge   Final Clinical Impression(s) / ED Diagnoses Final diagnoses:  Acute pain of left shoulder    Rx / DC Orders ED Discharge Orders          Ordered    celecoxib  (CELEBREX ) 200 MG capsule  2 times daily        12/18/23 0129    acetaminophen  (TYLENOL ) 500 MG tablet  Every 6 hours PRN        12/18/23 0129              Onetha Bile, MD 12/18/23 [phone]

## 2023-12-19 ENCOUNTER — Other Ambulatory Visit (HOSPITAL_COMMUNITY): Payer: Self-pay

## 2023-12-19 MED ORDER — ROPINIROLE HCL 1 MG PO TABS
1.0000 mg | ORAL_TABLET | Freq: Every day | ORAL | 0 refills | Status: DC
Start: 2023-12-19 — End: 2024-01-24
  Filled 2023-12-19 – 2023-12-25 (×2): qty 30, 30d supply, fill #0

## 2023-12-19 MED ORDER — PANTOPRAZOLE SODIUM 40 MG PO TBEC
40.0000 mg | DELAYED_RELEASE_TABLET | Freq: Every day | ORAL | 0 refills | Status: DC
  Filled 2023-12-19 – 2023-12-25 (×2): qty 30, 30d supply, fill #0

## 2023-12-19 MED ORDER — LOSARTAN POTASSIUM 50 MG PO TABS
50.0000 mg | ORAL_TABLET | Freq: Every day | ORAL | 0 refills | Status: DC
Start: 2023-12-19 — End: 2024-01-24
  Filled 2023-12-19 – 2023-12-25 (×2): qty 30, 30d supply, fill #0

## 2023-12-20 ENCOUNTER — Other Ambulatory Visit: Payer: Self-pay

## 2023-12-20 ENCOUNTER — Other Ambulatory Visit (HOSPITAL_COMMUNITY): Payer: Self-pay

## 2023-12-22 ENCOUNTER — Other Ambulatory Visit: Payer: Self-pay

## 2023-12-22 ENCOUNTER — Other Ambulatory Visit (HOSPITAL_COMMUNITY): Payer: Self-pay

## 2023-12-24 ENCOUNTER — Other Ambulatory Visit (HOSPITAL_COMMUNITY): Payer: Self-pay

## 2023-12-24 ENCOUNTER — Other Ambulatory Visit: Payer: Self-pay

## 2023-12-25 ENCOUNTER — Other Ambulatory Visit (HOSPITAL_COMMUNITY): Payer: Self-pay

## 2023-12-26 ENCOUNTER — Other Ambulatory Visit: Payer: Self-pay

## 2023-12-27 ENCOUNTER — Other Ambulatory Visit: Payer: Self-pay

## 2024-01-01 ENCOUNTER — Encounter: Payer: Self-pay | Admitting: Family Medicine

## 2024-01-01 ENCOUNTER — Other Ambulatory Visit: Payer: Self-pay

## 2024-01-01 ENCOUNTER — Other Ambulatory Visit (HOSPITAL_COMMUNITY): Payer: Self-pay

## 2024-01-01 DIAGNOSIS — M25512 Pain in left shoulder: Secondary | ICD-10-CM

## 2024-01-01 MED ORDER — MELOXICAM 7.5 MG PO TABS
7.5000 mg | ORAL_TABLET | Freq: Every day | ORAL | 0 refills | Status: DC
  Filled 2024-01-01: qty 30, 30d supply, fill #0

## 2024-01-16 ENCOUNTER — Ambulatory Visit (AMBULATORY_SURGERY_CENTER): Payer: Medicare (Managed Care)

## 2024-01-16 ENCOUNTER — Other Ambulatory Visit (HOSPITAL_COMMUNITY): Payer: Self-pay

## 2024-01-16 ENCOUNTER — Other Ambulatory Visit: Payer: Self-pay

## 2024-01-16 VITALS — Ht 65.0 in | Wt 202.0 lb

## 2024-01-16 DIAGNOSIS — Z1211 Encounter for screening for malignant neoplasm of colon: Secondary | ICD-10-CM

## 2024-01-16 DIAGNOSIS — Z860100 Personal history of colon polyps, unspecified: Secondary | ICD-10-CM

## 2024-01-16 MED ORDER — NA SULFATE-K SULFATE-MG SULF 17.5-3.13-1.6 GM/177ML PO SOLN
1.0000 | Freq: Once | ORAL | 0 refills | Status: AC
  Filled 2024-01-16: qty 354, 1d supply, fill #0

## 2024-01-16 NOTE — Progress Notes (Signed)
 No egg or soy allergy known to patient  No issues known to pt with past sedation with any surgeries or procedures Patient denies ever being told they had issues or difficulty with intubation  No FH of Malignant Hyperthermia Pt is not on diet pills Pt is not on  home 02  Pt is not on blood thinners  constipation on occasion taking OTC fiber  No A fib or A flutter Have any cardiac testing pending-- no  LOA: independent  Prep: suprep   Patient's chart reviewed by Rogena Class CNRA prior to previsit and patient appropriate for the [facility].  Previsit completed and red dot placed by patient's name on their procedure day (on provider's schedule).     PV completed with patient. Prep instructions sent via mychart and home address.

## 2024-01-17 ENCOUNTER — Ambulatory Visit: Payer: Medicare (Managed Care) | Admitting: Family Medicine

## 2024-01-22 ENCOUNTER — Other Ambulatory Visit (HOSPITAL_COMMUNITY): Payer: Self-pay

## 2024-01-23 ENCOUNTER — Encounter: Payer: Self-pay | Admitting: Family Medicine

## 2024-01-23 ENCOUNTER — Ambulatory Visit (INDEPENDENT_AMBULATORY_CARE_PROVIDER_SITE_OTHER): Payer: Medicare (Managed Care) | Admitting: Family Medicine

## 2024-01-23 VITALS — BP 116/68 | HR 63 | Temp 97.3°F | Ht 65.0 in | Wt 207.8 lb

## 2024-01-23 DIAGNOSIS — I10 Essential (primary) hypertension: Secondary | ICD-10-CM

## 2024-01-23 DIAGNOSIS — Z9884 Bariatric surgery status: Secondary | ICD-10-CM

## 2024-01-23 DIAGNOSIS — M19012 Primary osteoarthritis, left shoulder: Secondary | ICD-10-CM | POA: Diagnosis not present

## 2024-01-23 NOTE — Assessment & Plan Note (Signed)
 I will assess vitamin levels to make sure Sheryl Cooper is maintaining these.

## 2024-01-23 NOTE — Assessment & Plan Note (Addendum)
 Blood pressure is in good control. Continue HCTZ 25 mg daily and losartan  50 mg daily. I will check annual renal function.

## 2024-01-23 NOTE — Assessment & Plan Note (Signed)
 Recommend we try some physical therapy to see if we can reduce pain and increase ROM.

## 2024-01-23 NOTE — Progress Notes (Signed)
 [facility] CARE [facility] [address] [place] [address] Dept: [phone] Dept Fax: [phone]  Chronic Care Office Visit  Subjective:    Patient ID: Sheryl Cooper, female    DOB: [birth year], 63 y.o..   MRN: [id number]  Chief Complaint  Patient presents with   Follow-up    3 month f/u.   No concerns.    History of Present Illness:  Patient is in today for reassessment of chronic medical issues.  Sheryl Cooper has a history of essential hypertension. She is managed on HCTZ 25 mg daily and losartan  50 mg daily.   Sheryl Cooper was seen on 5/[facility] at [facility] with acute left shoulder pain. She notes this has improved some. She understands she has chronic arthritis of the joint. She has been using OTC naproxen , ibuprofen, and Tylenol  as needed. She did get some improvement with Toradol , but does not want another shot at this point.  Past Medical History: Patient Active Problem List   Diagnosis Date Noted   Arthritis of left shoulder 01/23/2024   Osteoarthritis of multiple joints 10/17/2023   History of bariatric surgery 10/19/2021   Gait abnormality 01/08/2020   Anisocoria 01/08/2020   Gastro-esophageal reflux disease without esophagitis 06/20/2015   Anxiety and depression 07/08/2014   Chronic post-traumatic stress disorder (PTSD) 06/13/2014   Restless leg syndrome 05/28/2013   Insomnia 05/28/2013   Essential hypertension 09/24/2010   History of panic attacks 02/24/2010   DJD (degenerative joint disease) of knee 02/24/2010   Past Surgical History:  Procedure Laterality Date   CARDIAC CATHETERIZATION  06-22-2015  @[facility]    for chest pain/ precordial pain----  normal coronaries with lvsf 50%   COLONOSCOPY  07/2013   WITH POLYP REMOVAL   FRACTURE SURGERY     HYSTEROSCOPY W/ ENDOMETRIAL ABLATION  03-23-2010  @[facility]    LAPAROSCOPIC GASTRIC SLEEVE RESECTION N/A 06/25/2018   Procedure: LAPAROSCOPIC GASTRIC SLEEVE RESECTION,  UPPER ENDO, ERAS PATHWAY;  Surgeon: Mikell Katz, MD;  Location: [facility];  Service: General;  Laterality: N/A;   ORIF ANKLE FRACTURE Right 11/24/2020   Procedure: OPEN REDUCTION INTERNAL FIXATION (ORIF) ANKLE FRACTURE;  Surgeon: Beverley Evalene BIRCH, MD;  Location: [facility];  Service: Orthopedics;  Laterality: Right;   POLYPECTOMY     TONSILLECTOMY  1976   TUBAL LIGATION     Family History  Problem Relation Age of Onset   Cancer Mother        breast (47), cervical (76), skin (67)   Diabetes Mother    Heart disease Mother    Hypertension Mother    Arthritis Mother    Obesity Mother    Stroke Mother    Varicose Veins Mother    Heart disease Father    Diabetes Father    Diabetes Maternal Aunt    Stroke Maternal Grandmother    Heart disease Maternal Grandmother    Heart disease Maternal Grandfather    Colon cancer Neg Hx    Esophageal cancer Neg Hx    Stomach cancer Neg Hx    Rectal cancer Neg Hx    Colon polyps Neg Hx    Outpatient Medications Prior to Visit  Medication Sig Dispense Refill   acetaminophen  (TYLENOL ) 500 MG tablet Take 2 tablets (1,000 mg total) by mouth every 6 (six) hours as needed. 30 tablet 0   celecoxib  (CELEBREX ) 200 MG capsule Take 1 capsule (200 mg total) by mouth 2 (two) times daily. 20 capsule 0   cyanocobalamin (VITAMIN  B12) 1000 MCG tablet Take 1,000 mcg by mouth daily.     hydrochlorothiazide  (HYDRODIURIL ) 25 MG tablet Take 1 tablet (25 mg total) by mouth in the morning. 90 tablet 0   losartan  (COZAAR ) 50 MG tablet Take 1 tablet (50 mg total) by mouth daily. 30 tablet 0   ondansetron  (ZOFRAN ) 4 MG tablet Take 1 tablet (4 mg total) by mouth daily as needed for nausea 10 tablet 0   pantoprazole  (PROTONIX ) 40 MG tablet Take 1 tablet (40 mg total) by mouth daily. 30 tablet 0   rOPINIRole  (REQUIP ) 1 MG tablet Take 1 tablet (1 mg total) by mouth 1 to 3 hours prior to bedtime. 30 tablet 0   traMADol  (ULTRAM ) 50 MG tablet Take 1 tablet (50  mg) by mouth once as needed. average once per week, as needed. 30 tablet 0   meloxicam  (MOBIC ) 7.5 MG tablet Take 1 tablet (7.5 mg total) by mouth daily. (Patient not taking: Reported on 01/23/2024) 30 tablet 0   No facility-administered medications prior to visit.   Allergies  Allergen Reactions   Diflucan  [Fluconazole ] Itching   Sulfa  Antibiotics Hives   Ciprofloxacin  Rash   Objective:   Today's Vitals   01/23/24 1528  BP: 116/68  Pulse: 63  Temp: (!) 97.3 F (36.3 C)  TempSrc: Temporal  SpO2: 98%  Weight: 207 lb 12.8 oz (94.3 kg)  Height: 5' 5 (1.651 m)   Body mass index is 34.58 kg/m.   General: Well developed, well nourished. No acute distress. Extremities: Limited abduction of the left shoulder with some discomfort. No joint swelling or tenderness.  Psych: Alert and oriented. Normal mood and affect.  Health Maintenance Due  Topic Date Due   Medicare Annual Wellness (AWV)  Never done   Zoster Vaccines- Shingrix (1 of 2) Never done   Cervical Cancer Screening (HPV/Pap Cotest)  11/21/2015   Colonoscopy  07/16/2018   DEXA SCAN  Never done     Assessment & Plan:   Problem List Items Addressed This Visit       Cardiovascular and Mediastinum   Essential hypertension - Primary   Blood pressure is in good control. Continue HCTZ 25 mg daily and losartan  50 mg daily. I will check annual renal function.      Relevant Orders   Comprehensive metabolic panel with GFR     Musculoskeletal and Integument   Arthritis of left shoulder   Recommend we try some physical therapy to see if we can reduce pain and increase ROM.      Relevant Orders   Ambulatory referral to Physical Therapy     Other   History of bariatric surgery   I will assess vitamin levels to make sure Sheryl Cooper is maintaining these.      Relevant Orders   Comprehensive metabolic panel with GFR   Vitamin B12   Folate   VITAMIN D  25 Hydroxy (Vit-D Deficiency, Fractures)    Return in about 3  months (around 04/24/2024) for Reassessment.   Garnette CHRISTELLA Simpler, MD

## 2024-01-24 ENCOUNTER — Ambulatory Visit: Payer: Self-pay | Admitting: Family Medicine

## 2024-01-24 ENCOUNTER — Other Ambulatory Visit: Payer: Self-pay

## 2024-01-24 ENCOUNTER — Other Ambulatory Visit (HOSPITAL_COMMUNITY): Payer: Self-pay

## 2024-01-24 DIAGNOSIS — N1831 Chronic kidney disease, stage 3a: Secondary | ICD-10-CM | POA: Insufficient documentation

## 2024-01-24 LAB — VITAMIN B12: Vitamin B-12: >1500 pg/mL — ABNORMAL HIGH (ref 211–911)

## 2024-01-24 LAB — COMPREHENSIVE METABOLIC PANEL WITH GFR
ALT: 10 U/L (ref 0–35)
AST: 36 U/L (ref 0–37)
Albumin: 4.1 g/dL (ref 3.5–5.2)
Alkaline Phosphatase: 71 U/L (ref 39–117)
BUN: 23 mg/dL (ref 6–23)
CO2: 29 meq/L (ref 19–32)
Calcium: 9.3 mg/dL (ref 8.4–10.5)
Chloride: 102 meq/L (ref 96–112)
Creatinine, Ser: 1.11 mg/dL (ref 0.40–1.20)
GFR: 52.2 mL/min — ABNORMAL LOW (ref >60.00)
Glucose, Bld: 91 mg/dL (ref 70–99)
Potassium: 3.8 meq/L (ref 3.5–5.1)
Sodium: 139 meq/L (ref 135–145)
Total Bilirubin: 0.3 mg/dL (ref 0.2–1.2)
Total Protein: 7.3 g/dL (ref 6.0–8.3)

## 2024-01-24 LAB — VITAMIN D 25 HYDROXY (VIT D DEFICIENCY, FRACTURES): VITD: 18.92 ng/mL — ABNORMAL LOW (ref 30.00–100.00)

## 2024-01-24 LAB — FOLATE: Folate: 12.7 ng/mL (ref >5.9)

## 2024-01-24 MED ORDER — ROPINIROLE HCL 1 MG PO TABS
1.0000 mg | ORAL_TABLET | Freq: Every day | ORAL | 0 refills | Status: DC
  Filled 2024-01-24: qty 30, 30d supply, fill #0

## 2024-01-24 MED ORDER — PANTOPRAZOLE SODIUM 40 MG PO TBEC
40.0000 mg | DELAYED_RELEASE_TABLET | Freq: Every day | ORAL | 0 refills | Status: DC
  Filled 2024-01-24: qty 30, 30d supply, fill #0

## 2024-01-24 MED ORDER — LOSARTAN POTASSIUM 50 MG PO TABS
50.0000 mg | ORAL_TABLET | Freq: Every day | ORAL | 0 refills | Status: DC
  Filled 2024-01-24 – 2024-01-29 (×2): qty 30, 30d supply, fill #0

## 2024-01-29 ENCOUNTER — Other Ambulatory Visit: Payer: Self-pay

## 2024-01-29 ENCOUNTER — Other Ambulatory Visit (HOSPITAL_COMMUNITY): Payer: Self-pay

## 2024-02-07 ENCOUNTER — Telehealth: Payer: Self-pay | Admitting: Internal Medicine

## 2024-02-07 NOTE — Telephone Encounter (Signed)
 Inbound call from patient stating she has been having a fever, stomach ache, and vomiting. Patient would like a call from the nurse to be advised on proceeding with tomorrow's 7/10 colonoscopy. Please advise, thank you

## 2024-02-07 NOTE — Telephone Encounter (Signed)
 Returned the patients phone call. She reports that she can't keep anything down and is running a low grade fever. Rescheduled her for Enis Riecke 03/11/24. Reviewed new times for her prep. Pt verbalized understanding.

## 2024-02-08 ENCOUNTER — Encounter: Payer: Medicare (Managed Care) | Admitting: Internal Medicine

## 2024-02-18 ENCOUNTER — Other Ambulatory Visit (HOSPITAL_COMMUNITY): Payer: Self-pay

## 2024-02-20 ENCOUNTER — Other Ambulatory Visit (HOSPITAL_COMMUNITY): Payer: Self-pay

## 2024-02-20 MED ORDER — HYDROCHLOROTHIAZIDE 25 MG PO TABS
25.0000 mg | ORAL_TABLET | Freq: Every morning | ORAL | 0 refills | Status: DC
  Filled 2024-02-20: qty 30, 30d supply, fill #0

## 2024-02-21 ENCOUNTER — Other Ambulatory Visit (HOSPITAL_COMMUNITY): Payer: Self-pay

## 2024-02-28 ENCOUNTER — Other Ambulatory Visit: Payer: Self-pay

## 2024-02-28 ENCOUNTER — Other Ambulatory Visit (HOSPITAL_COMMUNITY): Payer: Self-pay

## 2024-02-28 ENCOUNTER — Other Ambulatory Visit: Payer: Self-pay | Admitting: Family Medicine

## 2024-02-28 ENCOUNTER — Encounter: Payer: Self-pay | Admitting: Family Medicine

## 2024-02-28 DIAGNOSIS — M150 Primary generalized (osteo)arthritis: Secondary | ICD-10-CM

## 2024-02-28 MED ORDER — ONDANSETRON HCL 4 MG PO TABS
4.0000 mg | ORAL_TABLET | Freq: Every day | ORAL | 0 refills | Status: DC | PRN
  Filled 2024-02-28: qty 10, 10d supply, fill #0

## 2024-02-28 MED ORDER — PANTOPRAZOLE SODIUM 40 MG PO TBEC
40.0000 mg | DELAYED_RELEASE_TABLET | Freq: Every day | ORAL | 3 refills | Status: AC
  Filled 2024-02-28: qty 90, 90d supply, fill #0
  Filled 2024-05-23 – 2024-05-30 (×2): qty 90, 90d supply, fill #1
  Filled 2024-08-19 – 2024-08-29 (×2): qty 90, 90d supply, fill #2

## 2024-02-28 MED ORDER — HYDROCHLOROTHIAZIDE 25 MG PO TABS
25.0000 mg | ORAL_TABLET | Freq: Every morning | ORAL | 3 refills | Status: AC
  Filled 2024-02-28 – 2024-05-01 (×2): qty 90, 90d supply, fill #0
  Filled 2024-05-23 – 2024-08-02 (×2): qty 90, 90d supply, fill #1

## 2024-02-28 MED ORDER — TRAMADOL HCL 50 MG PO TABS
50.0000 mg | ORAL_TABLET | ORAL | 0 refills | Status: DC
  Filled 2024-02-28 – 2024-08-19 (×3): qty 30, 210d supply, fill #0

## 2024-02-28 MED ORDER — ROPINIROLE HCL 1 MG PO TABS
1.0000 mg | ORAL_TABLET | Freq: Every day | ORAL | 3 refills | Status: AC
  Filled 2024-02-28: qty 90, 90d supply, fill #0
  Filled 2024-05-23 – 2024-05-30 (×2): qty 90, 90d supply, fill #1
  Filled 2024-08-19 – 2024-08-29 (×2): qty 90, 90d supply, fill #2

## 2024-02-28 MED ORDER — LOSARTAN POTASSIUM 50 MG PO TABS
50.0000 mg | ORAL_TABLET | Freq: Every day | ORAL | 3 refills | Status: AC
  Filled 2024-02-28: qty 90, 90d supply, fill #0
  Filled 2024-05-23 – 2024-05-30 (×2): qty 90, 90d supply, fill #1
  Filled 2024-08-19 – 2024-08-29 (×2): qty 90, 90d supply, fill #2

## 2024-02-28 NOTE — Telephone Encounter (Signed)
 Refill request for  Tramadol  50 mg LR  10/17/23, #30, 0 rf LOV  01/23/24 FOV  04/24/24  Please review and advise.  Thanks. Dm/cma

## 2024-02-28 NOTE — Addendum Note (Signed)
 Addended by: KYM AQUAS L on: 02/28/2024 01:01 PM   Modules accepted: Orders

## 2024-02-29 ENCOUNTER — Other Ambulatory Visit (HOSPITAL_COMMUNITY): Payer: Self-pay

## 2024-02-29 MED ORDER — LOSARTAN POTASSIUM 50 MG PO TABS: 50.0000 mg | ORAL_TABLET | Freq: Every day | ORAL | 0 refills | Status: DC

## 2024-02-29 MED ORDER — HYDROCHLOROTHIAZIDE 25 MG PO TABS
25.0000 mg | ORAL_TABLET | Freq: Every morning | ORAL | 0 refills | Status: DC
  Filled 2024-03-17: qty 15, 15d supply, fill #0

## 2024-02-29 MED ORDER — ROPINIROLE HCL 1 MG PO TABS: 1.0000 mg | ORAL_TABLET | Freq: Every day | ORAL | 0 refills | Status: DC

## 2024-03-11 ENCOUNTER — Telehealth: Payer: Self-pay | Admitting: Internal Medicine

## 2024-03-11 ENCOUNTER — Encounter: Payer: Medicare (Managed Care) | Admitting: Internal Medicine

## 2024-03-11 NOTE — Telephone Encounter (Signed)
This patient is a no show

## 2024-03-11 NOTE — Telephone Encounter (Signed)
 Good Afternoon Dr.Perry   Patient was scheduled for a procedure at 1pm, called patient at 1:19 pm and no answer left voice mail to call back if they were on the way or running behind. Tried to reach patient again at 1:50pm and no answer, would you like me to no show them ?   Please advise  Thank you

## 2024-03-18 ENCOUNTER — Other Ambulatory Visit (HOSPITAL_COMMUNITY): Payer: Self-pay

## 2024-03-18 ENCOUNTER — Other Ambulatory Visit: Payer: Self-pay

## 2024-03-26 ENCOUNTER — Other Ambulatory Visit (HOSPITAL_COMMUNITY): Payer: Self-pay

## 2024-04-24 ENCOUNTER — Other Ambulatory Visit (HOSPITAL_COMMUNITY): Payer: Self-pay

## 2024-04-24 ENCOUNTER — Ambulatory Visit (INDEPENDENT_AMBULATORY_CARE_PROVIDER_SITE_OTHER): Payer: Medicare (Managed Care) | Admitting: Family Medicine

## 2024-04-24 ENCOUNTER — Other Ambulatory Visit: Payer: Self-pay

## 2024-04-24 ENCOUNTER — Encounter: Payer: Self-pay | Admitting: Family Medicine

## 2024-04-24 VITALS — BP 120/74 | HR 61 | Temp 97.2°F | Ht 65.0 in | Wt 213.4 lb

## 2024-04-24 DIAGNOSIS — E6609 Other obesity due to excess calories: Secondary | ICD-10-CM | POA: Insufficient documentation

## 2024-04-24 DIAGNOSIS — E66812 Obesity, class 2: Secondary | ICD-10-CM | POA: Diagnosis not present

## 2024-04-24 DIAGNOSIS — Z6835 Body mass index (BMI) 35.0-35.9, adult: Secondary | ICD-10-CM

## 2024-04-24 DIAGNOSIS — N1831 Chronic kidney disease, stage 3a: Secondary | ICD-10-CM | POA: Diagnosis not present

## 2024-04-24 DIAGNOSIS — I10 Essential (primary) hypertension: Secondary | ICD-10-CM | POA: Diagnosis not present

## 2024-04-24 DIAGNOSIS — M19012 Primary osteoarthritis, left shoulder: Secondary | ICD-10-CM

## 2024-04-24 MED ORDER — MELOXICAM 7.5 MG PO TABS
7.5000 mg | ORAL_TABLET | Freq: Every day | ORAL | 0 refills | Status: DC
  Filled 2024-04-24: qty 30, 30d supply, fill #0

## 2024-04-24 NOTE — Progress Notes (Signed)
 [facility]ARE [facility] [address] [place] [address] Dept: [phone] Dept Fax: [phone]  Chronic Care Office Visit  Subjective:    Patient ID: Sheryl Cooper, female    DOB: Mar 27, 1961, 63 y.o..   MRN: [id number]  Chief Complaint  Patient presents with   Hypertension    3 month f/u HTN.  No concerns.     History of Present Illness:  Patient is in today for reassessment of chronic medical issues.  Ms. Titus has a history of essential hypertension. She is managed on HCTZ 25 mg daily and losartan  50 mg daily. Her last labs showed a decline in her GFR.    Ms. Tetzlaff has chronic arthritis of the left shoulder joint. She is using meloxicam  7.5 mg daily and has found that this is now helping.  Ms. Thetford notes that she is concerned about her weight. She would like to engage in weight loss beyond her current efforts. Her insurance will not pay for her to go to [employer] [facility] program. She has used phentermine  in the past. She has also looked in to ways to obtain a GLP1 RA at a more affordable cost.  Past Medical History: Patient Active Problem List   Diagnosis Date Noted   Class 2 obesity due to excess calories with body mass index (BMI) of 35.0 to 35.9 in adult 04/24/2024   Stage 3a chronic kidney disease (CKD) (HCC) 01/24/2024   Arthritis of left shoulder 01/23/2024   Osteoarthritis of multiple joints 10/17/2023   History of bariatric surgery 10/19/2021   Gait abnormality 01/08/2020   Anisocoria 01/08/2020   Gastro-esophageal reflux disease without esophagitis 06/20/2015   Anxiety and depression 07/08/2014   Chronic post-traumatic stress disorder (PTSD) 06/13/2014   Restless leg syndrome 05/28/2013   Insomnia 05/28/2013   Essential hypertension 09/24/2010   History of panic attacks 02/24/2010   DJD (degenerative joint disease) of knee 02/24/2010   Past Surgical History:  Procedure Laterality Date    CARDIAC CATHETERIZATION  06-22-2015  @[facility]    for chest pain/ precordial pain----  normal coronaries with lvsf 50%   COLONOSCOPY  07/2013   WITH POLYP REMOVAL   FRACTURE SURGERY     HYSTEROSCOPY W/ ENDOMETRIAL ABLATION  03-23-2010  @[facility]    LAPAROSCOPIC GASTRIC SLEEVE RESECTION N/A 06/25/2018   Procedure: LAPAROSCOPIC GASTRIC SLEEVE RESECTION, UPPER ENDO, ERAS PATHWAY;  Surgeon: Mikell Katz, MD;  Location: [facility];  Service: General;  Laterality: N/A;   ORIF ANKLE FRACTURE Right 11/24/2020   Procedure: OPEN REDUCTION INTERNAL FIXATION (ORIF) ANKLE FRACTURE;  Surgeon: Beverley Evalene BIRCH, MD;  Location: [facility];  Service: Orthopedics;  Laterality: Right;   POLYPECTOMY     TONSILLECTOMY  1976   TUBAL LIGATION     Family History  Problem Relation Age of Onset   Cancer Mother        breast (87), cervical (76), skin (14)   Diabetes Mother    Heart disease Mother    Hypertension Mother    Arthritis Mother    Obesity Mother    Stroke Mother    Varicose Veins Mother    Heart disease Father    Diabetes Father    Diabetes Maternal Aunt    Stroke Maternal Grandmother    Heart disease Maternal Grandmother    Heart disease Maternal Grandfather    Colon cancer Neg Hx    Esophageal cancer Neg Hx    Stomach cancer Neg Hx  Rectal cancer Neg Hx    Colon polyps Neg Hx    Outpatient Medications Prior to Visit  Medication Sig Dispense Refill   acetaminophen  (TYLENOL ) 500 MG tablet Take 2 tablets (1,000 mg total) by mouth every 6 (six) hours as needed. 30 tablet 0   celecoxib  (CELEBREX ) 200 MG capsule Take 1 capsule (200 mg total) by mouth 2 (two) times daily. 20 capsule 0   cyanocobalamin  (VITAMIN B12) 1000 MCG tablet Take 1,000 mcg by mouth daily.     hydrochlorothiazide  (HYDRODIURIL ) 25 MG tablet Take 1 tablet (25 mg total) by mouth every morning. 90 tablet 3   losartan  (COZAAR ) 50 MG tablet Take 1 tablet (50 mg total) by mouth daily. 90 tablet 3   ondansetron  (ZOFRAN ) 4  MG tablet Take 1 tablet (4 mg total) by mouth daily as needed for nausea 10 tablet 0   pantoprazole  (PROTONIX ) 40 MG tablet Take 1 tablet (40 mg total) by mouth daily. 90 tablet 3   rOPINIRole  (REQUIP ) 1 MG tablet Take 1 tablet (1 mg total) by mouth 1 to 3 hours prior to bedtime. 90 tablet 3   traMADol  (ULTRAM ) 50 MG tablet Take 1 tablet (50 mg) by mouth once as needed. average once per week, as needed. 30 tablet 0   hydrochlorothiazide  (HYDRODIURIL ) 25 MG tablet Take 1 tablet (25 mg total) by mouth every morning. -Needs office visit- 15 tablet 0   losartan  (COZAAR ) 50 MG tablet Take 1 tablet (50 mg total) by mouth daily. 15 tablet 0   meloxicam  (MOBIC ) 7.5 MG tablet Take 1 tablet (7.5 mg total) by mouth daily. 30 tablet 0   rOPINIRole  (REQUIP ) 1 MG tablet Take 1 tablet (1 mg total) by mouth 1 to 3 hours before bedtime. -Needs office visit- 15 tablet 0   No facility-administered medications prior to visit.   Allergies  Allergen Reactions   Diflucan  [Fluconazole ] Itching   Sulfa  Antibiotics Hives   Ciprofloxacin  Rash   Objective:   Today's Vitals   04/24/24 1535  BP: 120/74  Pulse: 61  Temp: (!) 97.2 F (36.2 C)  TempSrc: Temporal  SpO2: 98%  Weight: 213 lb 6.4 oz (96.8 kg)  Height: 5' 5 (1.651 m)   Body mass index is 35.51 kg/m.   General: Well developed, well nourished. No acute distress. Psych: Alert and oriented. Normal mood and affect.  Health Maintenance Due  Topic Date Due   Medicare Annual Wellness (AWV)  Never done   Zoster Vaccines- Shingrix (1 of 2) Never done   Cervical Cancer Screening (HPV/Pap Cotest)  11/21/2015   Colonoscopy  07/16/2018   DEXA SCAN  Never done   Lab Results Component Ref Range & Units (hover) 3 mo ago (01/23/24)  Sodium 139  Potassium 3.8  Chloride 102  CO2 29  Glucose, Bld 91  BUN 23  Creatinine, Ser 1.11  Total Bilirubin 0.3  Alkaline Phosphatase 71  AST 36  ALT 10  Total Protein 7.3  Albumin 4.1  GFR 52.20 Low   Calcium  9.3   Assessment & Plan:   Problem List Items Addressed This Visit       Cardiovascular and Mediastinum   Essential hypertension - Primary   Blood pressure is in good control. Continue HCTZ 25 mg daily and losartan  50 mg daily. I will check annual renal function.        Musculoskeletal and Integument   Arthritis of left shoulder   Improved currently on meloxicam  7.5 mg daily. We will want  to monitor her kidney function. If this is worsenign, she may not be able to continue on an NSAID.      Relevant Medications   meloxicam  (MOBIC ) 7.5 MG tablet     Genitourinary   Stage 3a chronic kidney disease (CKD) (HCC)   I will reassess her renal function today.      Relevant Orders   Basic metabolic panel with GFR     Other   Class 2 obesity due to excess calories with body mass index (BMI) of 35.0 to 35.9 in adult   Discussed principles of weight management. We did discuss the role of medication to augment dietary and exercise efforts. In light of hypertension, I feel phentermine -containing options would be contraindicated. As she is amenable to access through [employer], we discussed her registering for this and then notifying me to send in a prescription.       Return for Follow-up as scheduled.   Garnette CHRISTELLA Simpler, MD

## 2024-04-24 NOTE — Assessment & Plan Note (Signed)
 Discussed principles of weight management. We did discuss the role of medication to augment dietary and exercise efforts. In light of hypertension, I feel phentermine -containing options would be contraindicated. As she is amenable to access through [employer], we discussed her registering for this and then notifying me to send in a prescription.

## 2024-04-24 NOTE — Assessment & Plan Note (Signed)
I will reassess her renal function today.

## 2024-04-24 NOTE — Assessment & Plan Note (Signed)
 Blood pressure is in good control. Continue HCTZ 25 mg daily and losartan  50 mg daily. I will check annual renal function.

## 2024-04-24 NOTE — Assessment & Plan Note (Signed)
 Improved currently on meloxicam  7.5 mg daily. We will want to monitor her kidney function. If this is worsenign, she may not be able to continue on an NSAID.

## 2024-04-25 ENCOUNTER — Ambulatory Visit: Payer: Self-pay | Admitting: Family Medicine

## 2024-04-25 LAB — BASIC METABOLIC PANEL WITH GFR
BUN: 21 mg/dL (ref 6–23)
CO2: 26 meq/L (ref 19–32)
Calcium: 9.3 mg/dL (ref 8.4–10.5)
Chloride: 102 meq/L (ref 96–112)
Creatinine, Ser: 0.94 mg/dL (ref 0.40–1.20)
GFR: 63.62 mL/min (ref >60.00)
Glucose, Bld: 84 mg/dL (ref 70–99)
Potassium: 3.8 meq/L (ref 3.5–5.1)
Sodium: 139 meq/L (ref 135–145)

## 2024-05-01 ENCOUNTER — Other Ambulatory Visit: Payer: Self-pay

## 2024-05-15 ENCOUNTER — Other Ambulatory Visit: Payer: Self-pay | Admitting: Family Medicine

## 2024-05-15 DIAGNOSIS — Z1231 Encounter for screening mammogram for malignant neoplasm of breast: Secondary | ICD-10-CM

## 2024-05-23 ENCOUNTER — Other Ambulatory Visit (HOSPITAL_COMMUNITY): Payer: Self-pay

## 2024-05-23 ENCOUNTER — Other Ambulatory Visit: Payer: Self-pay | Admitting: Family Medicine

## 2024-05-23 ENCOUNTER — Other Ambulatory Visit: Payer: Self-pay

## 2024-05-23 DIAGNOSIS — M19012 Primary osteoarthritis, left shoulder: Secondary | ICD-10-CM

## 2024-05-23 MED ORDER — MELOXICAM 7.5 MG PO TABS
7.5000 mg | ORAL_TABLET | Freq: Every day | ORAL | 0 refills | Status: AC
  Filled 2024-05-23: qty 30, 30d supply, fill #0

## 2024-05-24 ENCOUNTER — Other Ambulatory Visit: Payer: Self-pay

## 2024-05-28 ENCOUNTER — Other Ambulatory Visit (HOSPITAL_COMMUNITY): Payer: Self-pay

## 2024-05-29 ENCOUNTER — Encounter: Payer: Self-pay | Admitting: Family Medicine

## 2024-05-29 ENCOUNTER — Other Ambulatory Visit: Payer: Self-pay | Admitting: Medical Genetics

## 2024-05-29 ENCOUNTER — Ambulatory Visit (INDEPENDENT_AMBULATORY_CARE_PROVIDER_SITE_OTHER): Payer: Medicare (Managed Care) | Admitting: Family Medicine

## 2024-05-29 VITALS — BP 128/82 | HR 65 | Temp 98.4°F | Ht 65.0 in | Wt 214.4 lb

## 2024-05-29 DIAGNOSIS — Z0000 Encounter for general adult medical examination without abnormal findings: Secondary | ICD-10-CM | POA: Diagnosis not present

## 2024-05-29 DIAGNOSIS — I10 Essential (primary) hypertension: Secondary | ICD-10-CM | POA: Diagnosis not present

## 2024-05-29 DIAGNOSIS — Z006 Encounter for examination for normal comparison and control in clinical research program: Secondary | ICD-10-CM

## 2024-05-29 NOTE — Patient Instructions (Signed)
  Ms. Mathisen , Thank you for taking time to come for your Medicare Wellness Visit. I appreciate your ongoing commitment to your health goals. Please review the following plan we discussed and let me know if I can assist you in the future.   These are the goals we discussed:  Goals      Blood Pressure < 140/90     Exercise 3x per week (30 min per time)     Still wants to be more active.      Weight < 200 lb (90.719 kg)        This is a list of the screening recommended for you and due dates:  Health Maintenance  Topic Date Due   Medicare Annual Wellness Visit  Never done   Zoster (Shingles) Vaccine (1 of 2) Never done   Pap with HPV screening  11/21/2015   Colon Cancer Screening  07/16/2018   DEXA scan (bone density measurement)  Never done   COVID-19 Vaccine (3 - 2025-26 season) 04/01/2024   Breast Cancer Screening  12/12/2024   DTaP/Tdap/Td vaccine (2 - Td or Tdap) 04/08/2025   Pneumococcal Vaccine for age over 78  Completed   Flu Shot  Completed   Hepatitis C Screening  Completed   HIV Screening  Completed   Hepatitis B Vaccine  Aged Out   Meningitis B Vaccine  Aged Out

## 2024-05-29 NOTE — Progress Notes (Signed)
 [facility] [address] [place] [address] Dept: [phone] Dept Fax: [phone]  Welcome to Medicare Visit  Subjective:    Patient ID: Sheryl Cooper, female    DOB: 21-Feb-1961, 63 y.o..   MRN: [id number]  Chief Complaint  Patient presents with   Medicare Wellness    Welcome to Medicare.      Patient presents today for their Welcome to Medicare Exam   Preventive Screening-Counseling & Management  Vision screen:  Vision Screening   Right eye Left eye Both eyes  Without correction 20/20 20/15 20/13   With correction      Advanced Directives: Provided a sample [place] Advanced Directive and reviewed components.  Modifiable Risk Factors/behavioral risk assessment/psychosocial risk assessment  Regular exercise: Walking 5 days a week. Has made this part of her routine. Diet: Reducing sugared beverages, has stopped alcohol use, eating a light lunch.  Wt Readings from Last 3 Encounters:  05/29/24 214 lb 6.4 oz (97.3 kg)  04/24/24 213 lb 6.4 oz (96.8 kg)  01/23/24 207 lb 12.8 oz (94.3 kg)   Smoking Status: Quit 30 years ago. Only smoked for 2 years Second Hand Smoking status: No smokers in home  Alcohol intake: None  Cardiac risk factors: Advanced age (older than 63 for men, 28 for women) - Yes Hyperlipidemia -  No Hypertension - Yes, treated Diabetes - No Family History - Yes, mother, father, MGM, MGF  Depression Screen/risk evaluation Risk factors: None.     05/29/2024    1:50 PM 10/17/2023    3:28 PM 06/17/2020   11:33 AM 01/08/2020    8:33 AM 06/05/2019    8:30 AM  Depression screen PHQ 2/9  Decreased Interest 0 0 1 1 0  Down, Depressed, Hopeless 0 1 1 1 1   PHQ - 2 Score 0 1 2 2 1   Altered sleeping  3 1    Tired, decreased energy  2 1    Change in appetite  3 1    Feeling bad or failure about yourself   1 1    Trouble concentrating  0 1    Moving slowly or fidgety/restless  0 1    Suicidal  thoughts  0 0    PHQ-9 Score  10 8    Difficult doing work/chores  Not difficult at all      Functional ability and level of safety Mobility assessment: Timed get up and go (<12 seconds ) 10 secs.  Activities of Daily Living- Independent in ADLs (toileting, bathing, dressing, transferring, eating) and in IADLs (shopping, housekeeping, managing own medications, and handling finances)   Home Safety: Loose rugs (Yes), smoke detectors (Yes), small pets (Yes), grab bars (No), stairs (Yes), life-alert system (No)   Hearing Difficulties: None patient denies  Fall Risk: None     05/29/2024    1:52 PM 10/17/2023    3:27 PM 06/12/2019    8:22 AM 06/05/2019    8:30 AM 02/08/2018    3:34 PM  Fall Risk   Falls in the past year? 0 0 0  0  No   Number falls in past yr: 0 0  0    Injury with Fall? 0 0     Risk for fall due to : No Fall Risks No Fall Risks     Follow up Falls evaluation completed Falls evaluation completed  Falls evaluation completed       Data saved with a previous flowsheet row definition  Opioid use history: No long term opioids use   Self assessment of health status: Pretty good  Required Immunizations needed today:   Consider Zoster vaccine  Screening tests- Health Maintenance Due  Topic Date Due   Medicare Annual Wellness (AWV)  Never done   Zoster Vaccines- Shingrix (1 of 2) Never done   Cervical Cancer Screening (HPV/Pap Cotest)  11/21/2015   Colonoscopy  07/16/2018   DEXA SCAN  Never done   COVID-19 Vaccine (3 - 2025-26 season) 04/01/2024   1. Colon cancer screening- Scheduled for November 2. Cervical cancer screening- Recommend scheduling with GYN 4. Breast cancer screening- UTD  The following were reviewed and entered/updated in Epic: Patient Active Problem List   Diagnosis Date Noted   Class 2 obesity due to excess calories with body mass index (BMI) of 35.0 to 35.9 in adult 04/24/2024   Arthritis of left shoulder 01/23/2024   Osteoarthritis of  multiple joints 10/17/2023   History of bariatric surgery 10/19/2021   Gait abnormality 01/08/2020   Anisocoria 01/08/2020   Gastro-esophageal reflux disease without esophagitis 06/20/2015   Anxiety and depression 07/08/2014   Chronic post-traumatic stress disorder (PTSD) 06/13/2014   Restless leg syndrome 05/28/2013   Insomnia 05/28/2013   Essential hypertension 09/24/2010   History of panic attacks 02/24/2010   DJD (degenerative joint disease) of knee 02/24/2010   Past Surgical History:  Procedure Laterality Date   CARDIAC CATHETERIZATION  06-22-2015  @[facility]    for chest pain/ precordial pain----  normal coronaries with lvsf 50%   COLONOSCOPY  07/2013   WITH POLYP REMOVAL   FRACTURE SURGERY     HYSTEROSCOPY W/ ENDOMETRIAL ABLATION  03-23-2010  @[facility]    LAPAROSCOPIC GASTRIC SLEEVE RESECTION N/A 06/25/2018   Procedure: LAPAROSCOPIC GASTRIC SLEEVE RESECTION, UPPER ENDO, ERAS PATHWAY;  Surgeon: Mikell Katz, MD;  Location: [facility];  Service: General;  Laterality: N/A;   ORIF ANKLE FRACTURE Right 11/24/2020   Procedure: OPEN REDUCTION INTERNAL FIXATION (ORIF) ANKLE FRACTURE;  Surgeon: Beverley Evalene BIRCH, MD;  Location: [facility];  Service: Orthopedics;  Laterality: Right;   POLYPECTOMY     TONSILLECTOMY  1976   TUBAL LIGATION     Family History  Problem Relation Age of Onset   Cancer Mother        breast (61), cervical (76), skin (85)   Diabetes Mother    Heart disease Mother    Hypertension Mother    Arthritis Mother    Obesity Mother    Stroke Mother    Varicose Veins Mother    Heart disease Father    Diabetes Father    Diabetes Maternal Aunt    Stroke Maternal Grandmother    Heart disease Maternal Grandmother    Heart disease Maternal Grandfather    Colon cancer Neg Hx    Esophageal cancer Neg Hx    Stomach cancer Neg Hx    Rectal cancer Neg Hx    Colon polyps Neg Hx    Medications- reviewed and updated Current Outpatient Medications  Medication  Sig Dispense Refill   acetaminophen  (TYLENOL ) 500 MG tablet Take 2 tablets (1,000 mg total) by mouth every 6 (six) hours as needed. 30 tablet 0   celecoxib  (CELEBREX ) 200 MG capsule Take 1 capsule (200 mg total) by mouth 2 (two) times daily. 20 capsule 0   cyanocobalamin  (VITAMIN B12) 1000 MCG tablet Take 1,000 mcg by mouth daily.     hydrochlorothiazide  (HYDRODIURIL ) 25 MG tablet Take 1 tablet (25 mg total) by mouth  every morning. 90 tablet 3   losartan  (COZAAR ) 50 MG tablet Take 1 tablet (50 mg total) by mouth daily. 90 tablet 3   meloxicam  (MOBIC ) 7.5 MG tablet Take 1 tablet (7.5 mg total) by mouth daily. 30 tablet 0   ondansetron  (ZOFRAN ) 4 MG tablet Take 1 tablet (4 mg total) by mouth daily as needed for nausea 10 tablet 0   pantoprazole  (PROTONIX ) 40 MG tablet Take 1 tablet (40 mg total) by mouth daily. 90 tablet 3   rOPINIRole  (REQUIP ) 1 MG tablet Take 1 tablet (1 mg total) by mouth 1 to 3 hours prior to bedtime. 90 tablet 3   traMADol  (ULTRAM ) 50 MG tablet Take 1 tablet (50 mg) by mouth once as needed. average once per week, as needed. 30 tablet 0   No current facility-administered medications for this visit.   Allergies-reviewed and updated Allergies  Allergen Reactions   Diflucan  [Fluconazole ] Itching   Sulfa  Antibiotics Hives   Ciprofloxacin  Rash   Social History   Socioeconomic History   Marital status: Legally Separated    Spouse name: Not on file   Number of children: 4   Years of education: Not on file   Highest education level: Associate degree: occupational, scientist, product/process development, or vocational program  Occupational History   Occupation: Patient Engineer, Production    Comment: [employer]  Tobacco Use   Smoking status: Former    Current packs/day: 0.00    Types: Cigarettes    Quit date: 09/24/1973    Years since quitting: 50.7   Smokeless tobacco: Never  Vaping Use   Vaping status: Never Used  Substance and Sexual Activity   Alcohol use: Yes     Alcohol/week: 2.0 standard drinks of alcohol    Types: 1 Glasses of wine, 1 Shots of liquor per week    Comment: occasional   Drug use: No   Sexual activity: Not Currently    Birth control/protection: Abstinence, Post-menopausal  Other Topics Concern   Not on file  Social History Narrative   Not on file   Social Drivers of Health   Financial Resource Strain: Low Risk  (05/29/2024)   Overall Financial Resource Strain (CARDIA)    Difficulty of Paying Living Expenses: Not hard at all  Food Insecurity: No Food Insecurity (05/29/2024)   Hunger Vital Sign    Worried About Running Out of Food in the Last Year: Never true    Ran Out of Food in the Last Year: Never true  Transportation Needs: No Transportation Needs (05/29/2024)   PRAPARE - Administrator, Civil Service (Medical): No    Lack of Transportation (Non-Medical): No  Physical Activity: Unknown (05/29/2024)   Exercise Vital Sign    Days of Exercise per Week: Not on file    Minutes of Exercise per Session: 40 min  Stress: Stress Concern Present (05/29/2024)   [employer] of Occupational Health - Occupational Stress Questionnaire    Feeling of Stress: To some extent  Social Connections: Socially Isolated (05/29/2024)   Social Connection and Isolation Panel    Frequency of Communication with Friends and Family: More than three times a week    Frequency of Social Gatherings with Friends and Family: Once a week    Attends Religious Services: Never    Database Administrator or Organizations: No    Attends Banker Meetings: Never    Marital Status: Divorced   Objective:    Today's Vitals   05/29/24  1357  BP: 128/82  Pulse: 65  Temp: 98.4 F (36.9 C)  TempSrc: Temporal  SpO2: 98%  Weight: 214 lb 6.4 oz (97.3 kg)  Height: 5' 5 (1.651 m)   General: Well developed, well nourished. No acute distress. HEENT: Normocephalic, non-traumatic. PERRL, EOMI. Conjunctiva clear. External ears normal. EAC  and TMs   normal bilaterally. Nose clear without congestion or rhinorrhea. Mucous membranes moist. Oropharynx clear.   Poor dentition. Neck: Supple. No lymphadenopathy. No thyromegaly. Lungs: Clear to auscultation bilaterally. No wheezing, rales or rhonchi. CV: RRR without murmurs or rubs. Pulses 2+ bilaterally. Abdomen: Soft, non-tender. Bowel sounds positive, normal pitch and frequency. No hepatosplenomegaly. No   rebound or guarding. Extremities: Full ROM. No joint swelling or tenderness. No edema noted. Skin: Warm and dry. No rashes. Psych: Alert and oriented x3. Normal mood and affect.  EKG: Normal sinus rhythm (rate = 66)  Assessment & Plan:  1. Medicare annual wellness visit, initial (Primary) Welcome to Medicare exam completed- 1.      Educated, counseled and referred based on above elements 2.      Educated, counseled and referred as appropriate for preventative needs 3.      Discussed and documented a written plan for preventiative services and screenings with personalized             health advice- After Visit Summary was given to patient including recommended screenings. 4.      EKG 5.      Sample [place] Advanced Directive provided to patient.  2. Essential hypertension Blood pressure is at goal. Continue HCTZ 25 mg daily.  Recommended follow up:  Future Appointments  Date Time Provider Department Center  05/30/2024  4:00 PM [facility]  06/26/2024  8:00 AM [facility] [id number]   Return to clinic in 3 months.  Garnette CHRISTELLA Simpler, MD

## 2024-05-30 ENCOUNTER — Ambulatory Visit
Admission: RE | Admit: 2024-05-30 | Discharge: 2024-05-30 | Disposition: A | Discharge status: Home / Self Care | Payer: Medicare (Managed Care) | Source: Ambulatory Visit | Attending: Family Medicine | Admitting: Family Medicine

## 2024-05-30 ENCOUNTER — Other Ambulatory Visit (HOSPITAL_COMMUNITY): Payer: Self-pay

## 2024-05-30 DIAGNOSIS — Z1231 Encounter for screening mammogram for malignant neoplasm of breast: Secondary | ICD-10-CM

## 2024-06-26 ENCOUNTER — Other Ambulatory Visit: Payer: Medicare (Managed Care)

## 2024-06-26 ENCOUNTER — Inpatient Hospital Stay (HOSPITAL_BASED_OUTPATIENT_CLINIC_OR_DEPARTMENT_OTHER): Admission: RE | Admit: 2024-06-26 | Payer: Medicare (Managed Care) | Source: Ambulatory Visit

## 2024-07-09 ENCOUNTER — Other Ambulatory Visit: Payer: Self-pay

## 2024-08-02 ENCOUNTER — Other Ambulatory Visit (HOSPITAL_COMMUNITY): Payer: Self-pay

## 2024-08-06 ENCOUNTER — Other Ambulatory Visit (HOSPITAL_COMMUNITY): Payer: Self-pay

## 2024-08-19 ENCOUNTER — Other Ambulatory Visit (HOSPITAL_COMMUNITY): Payer: Self-pay

## 2024-08-19 ENCOUNTER — Other Ambulatory Visit: Payer: Self-pay | Admitting: Family Medicine

## 2024-08-19 ENCOUNTER — Other Ambulatory Visit: Payer: Self-pay

## 2024-08-19 MED ORDER — ONDANSETRON HCL 4 MG PO TABS
4.0000 mg | ORAL_TABLET | Freq: Every day | ORAL | 0 refills | Status: AC | PRN
  Filled 2024-08-19: qty 10, 10d supply, fill #0

## 2024-08-19 NOTE — Telephone Encounter (Signed)
 Refill request for  Ondansetron  4 mg LR 02/28/24, #10, 0 rf LOV  05/29/24 FOV  none scheduled.   Please review and advise.  Thanks. Dm/cma

## 2024-08-22 ENCOUNTER — Other Ambulatory Visit (HOSPITAL_COMMUNITY): Payer: Self-pay

## 2024-08-29 ENCOUNTER — Other Ambulatory Visit (HOSPITAL_COMMUNITY): Payer: Self-pay

## 2024-08-29 ENCOUNTER — Other Ambulatory Visit: Payer: Self-pay | Admitting: Family Medicine

## 2024-08-29 DIAGNOSIS — M150 Primary generalized (osteo)arthritis: Secondary | ICD-10-CM

## 2024-08-29 MED ORDER — TRAMADOL HCL 50 MG PO TABS
50.0000 mg | ORAL_TABLET | ORAL | 0 refills | Status: AC
  Filled 2024-08-29: qty 30, 210d supply, fill #0

## 2024-08-29 NOTE — Telephone Encounter (Signed)
 Refill request for  Tramadol  50 mg LR  02/28/24, #30, 1 rf LOV  05/29/24 FOV  none scheduled.   Please review and advise.  Thanks.  Dm/cma

## 2024-09-02 ENCOUNTER — Other Ambulatory Visit (HOSPITAL_COMMUNITY): Payer: Self-pay

## 2024-09-03 ENCOUNTER — Other Ambulatory Visit (HOSPITAL_COMMUNITY): Payer: Self-pay
